# Patient Record
Sex: Male | Born: 1940
Health system: Southern US, Community
[De-identification: ages and names within clinical notes are randomized; demographics above are authoritative.]

## PROBLEM LIST (undated history)

## (undated) DIAGNOSIS — K219 Gastro-esophageal reflux disease without esophagitis: Secondary | ICD-10-CM

## (undated) DIAGNOSIS — I10 Essential (primary) hypertension: Secondary | ICD-10-CM

## (undated) DIAGNOSIS — E785 Hyperlipidemia, unspecified: Secondary | ICD-10-CM

## (undated) DIAGNOSIS — E119 Type 2 diabetes mellitus without complications: Secondary | ICD-10-CM

## (undated) DIAGNOSIS — U071 COVID-19: Secondary | ICD-10-CM

## (undated) HISTORY — PX: CHOLECYSTECTOMY: SHX55

## (undated) HISTORY — PX: APPENDECTOMY: SHX54

---

## 2000-06-19 ENCOUNTER — Ambulatory Visit (HOSPITAL_COMMUNITY)
Admission: RE | Admit: 2000-06-19 | Discharge: 2000-06-19 | Payer: Self-pay | Admitting: Physical Medicine and Rehabilitation

## 2000-06-19 ENCOUNTER — Encounter: Payer: Self-pay | Admitting: Physical Medicine and Rehabilitation

## 2013-08-09 ENCOUNTER — Emergency Department (HOSPITAL_COMMUNITY)
Admission: EM | Admit: 2013-08-09 | Discharge: 2013-08-09 | Disposition: A | Discharge status: Home / Self Care | Payer: Medicare HMO | Attending: Emergency Medicine | Admitting: Emergency Medicine

## 2013-08-09 ENCOUNTER — Encounter (HOSPITAL_COMMUNITY): Payer: Self-pay | Admitting: Emergency Medicine

## 2013-08-09 ENCOUNTER — Emergency Department (HOSPITAL_COMMUNITY): Payer: Medicare HMO

## 2013-08-09 DIAGNOSIS — E119 Type 2 diabetes mellitus without complications: Secondary | ICD-10-CM | POA: Insufficient documentation

## 2013-08-09 DIAGNOSIS — R5383 Other fatigue: Principal | ICD-10-CM

## 2013-08-09 DIAGNOSIS — R197 Diarrhea, unspecified: Secondary | ICD-10-CM

## 2013-08-09 DIAGNOSIS — R531 Weakness: Secondary | ICD-10-CM

## 2013-08-09 DIAGNOSIS — R5381 Other malaise: Secondary | ICD-10-CM | POA: Insufficient documentation

## 2013-08-09 DIAGNOSIS — I10 Essential (primary) hypertension: Secondary | ICD-10-CM | POA: Insufficient documentation

## 2013-08-09 HISTORY — DX: Type 2 diabetes mellitus without complications: E11.9

## 2013-08-09 HISTORY — DX: Essential (primary) hypertension: I10

## 2013-08-09 LAB — I-STAT TROPONIN, ED: Troponin i, poc: 0 ng/mL (ref 0.00–0.08)

## 2013-08-09 LAB — URINALYSIS, ROUTINE W REFLEX MICROSCOPIC
Bilirubin Urine: NEGATIVE
Glucose, UA: 250 mg/dL — AB
Hgb urine dipstick: NEGATIVE
Ketones, ur: NEGATIVE mg/dL
Leukocytes, UA: NEGATIVE
Nitrite: NEGATIVE
Protein, ur: NEGATIVE mg/dL
Specific Gravity, Urine: 1.014 (ref 1.005–1.030)
Urobilinogen, UA: 0.2 mg/dL (ref 0.0–1.0)
pH: 6 (ref 5.0–8.0)

## 2013-08-09 LAB — CBC WITH DIFFERENTIAL/PLATELET
Basophils Absolute: 0 K/uL (ref 0.0–0.1)
Basophils Relative: 0 % (ref 0–1)
Eosinophils Absolute: 0.1 K/uL (ref 0.0–0.7)
Eosinophils Relative: 1 % (ref 0–5)
HCT: 35.5 % — ABNORMAL LOW (ref 39.0–52.0)
Hemoglobin: 12.6 g/dL — ABNORMAL LOW (ref 13.0–17.0)
Lymphocytes Relative: 24 % (ref 12–46)
Lymphs Abs: 1.4 10*3/uL (ref 0.7–4.0)
MCH: 31.3 pg (ref 26.0–34.0)
MCHC: 35.5 g/dL (ref 30.0–36.0)
MCV: 88.3 fL (ref 78.0–100.0)
Monocytes Absolute: 0.6 K/uL (ref 0.1–1.0)
Monocytes Relative: 10 % (ref 3–12)
Neutro Abs: 3.9 10*3/uL (ref 1.7–7.7)
Neutrophils Relative %: 65 % (ref 43–77)
Platelets: 252 10*3/uL (ref 150–400)
RBC: 4.02 MIL/uL — ABNORMAL LOW (ref 4.22–5.81)
RDW: 12.2 % (ref 11.5–15.5)
WBC: 6 10*3/uL (ref 4.0–10.5)

## 2013-08-09 LAB — COMPREHENSIVE METABOLIC PANEL
ALT: 21 U/L (ref 0–53)
Albumin: 3.7 g/dL (ref 3.5–5.2)
Alkaline Phosphatase: 57 U/L (ref 39–117)
CO2: 24 mEq/L (ref 19–32)
Chloride: 93 mEq/L — ABNORMAL LOW (ref 96–112)
GFR calc Af Amer: 72 mL/min — ABNORMAL LOW (ref >90)
GFR calc non Af Amer: 62 mL/min — ABNORMAL LOW (ref >90)
Glucose, Bld: 176 mg/dL — ABNORMAL HIGH (ref 70–99)
Potassium: 4.2 mEq/L (ref 3.7–5.3)
Sodium: 131 mEq/L — ABNORMAL LOW (ref 137–147)

## 2013-08-09 LAB — COMPREHENSIVE METABOLIC PANEL WITH GFR
AST: 17 U/L (ref 0–37)
BUN: 14 mg/dL (ref 6–23)
Calcium: 9.1 mg/dL (ref 8.4–10.5)
Creatinine, Ser: 1.14 mg/dL (ref 0.50–1.35)
Total Bilirubin: 0.5 mg/dL (ref 0.3–1.2)
Total Protein: 7.1 g/dL (ref 6.0–8.3)

## 2013-08-09 LAB — CBG MONITORING, ED: Glucose-Capillary: 160 mg/dL — ABNORMAL HIGH (ref 70–99)

## 2013-08-09 MED ORDER — SODIUM CHLORIDE 0.9 % IV BOLUS (SEPSIS)
500.0000 mL | Freq: Once | INTRAVENOUS | Status: AC
  Administered 2013-08-09: 500 mL via INTRAVENOUS

## 2013-08-09 NOTE — ED Notes (Signed)
He states his blood sugar "has been going up and down" the past couple of days then yesterday he noticed his BP was high even though he has been taking his BP medication regularly. He woke this am "and felt like i may have slept wrong, like the L side of my face was numb." his daughter came to see him and noticed his L face was drooping so she brought him to the ED. He states hes "felt bad" since yesterday and has had a headache also

## 2013-08-09 NOTE — ED Notes (Signed)
Pt left discharge paperwork, wife sheila called and message left for pt. Papers placed at triage, charge RN notified.

## 2013-08-09 NOTE — ED Notes (Signed)
Ambulated to bathroom without difficulty

## 2013-08-09 NOTE — ED Provider Notes (Signed)
CSN: [id number]     Arrival date & time 08/09/13  0935 History   First MD Initiated Contact with Patient 08/09/13 0945     Chief Complaint  Patient presents with  . Facial Droop     (Consider location/radiation/quality/duration/timing/severity/associated sxs/prior Treatment) HPI Comments: Reports not feeling well for approximately one week. He admits he overexerted himself one week ago working in the yard and putting up fence up. He and family member note that his blood sugars have been somewhat volatile over the last couple of days, this morning woke up and it was much higher than usual at 260. He had originally been taken off of metformin a few years ago was put back on a proximally 6 months ago. Yesterday his blood pressures were also running high, at one point was approximately 180/111. He had called his primary care office, but never had a call back. This morning his blood pressure seems improved, but now his blood sugars were high. Family member thought the patient might have a slight drawing in his face. Patient denies any numbness or tingling, no numbness or weakness in his arms or legs and no gait difficulty. He notes a very slight gradual decrease in visual acuity but this is been more chronic. He reports he has a very mild generalized headache. He denies any neck pain, stiffness, fever, rash. He denies any chest pain, exertional chest tightness, shortness of breath. Otherwise he has been compliant with his medications. He notes that yesterday he had 6 episodes of watery diarrhea and has had diarrhea symptoms for a few days associated with some abdominal cramping.    The history is provided by the patient, a relative and medical records.    Past Medical History  Diagnosis Date  . Hypertension   . Diabetes mellitus without complication    Past Surgical History  Procedure Laterality Date  . Cholecystectomy     History reviewed. No pertinent family history. History  Substance Use  Topics  . Smoking status: Never Smoker   . Smokeless tobacco: Not on file  . Alcohol Use: Yes    Review of Systems  All other systems reviewed and are negative.     Allergies  Review of patient's allergies indicates no known allergies.  Home Medications   Prior to Admission medications   Not on File   BP 136/82  Pulse 81  Temp(Src) 97.9 F (36.6 C) (Oral)  Resp 16  Ht 6\' 2"  (1.88 m)  Wt 216 lb (97.977 kg)  BMI 27.72 kg/m2  SpO2 97% Physical Exam  Nursing note and vitals reviewed. Constitutional: He appears well-developed and well-nourished.  HENT:  Head: Normocephalic and atraumatic.  Eyes: Conjunctivae are normal. No scleral icterus.  Neck: Normal range of motion. Neck supple.  Cardiovascular: Normal rate, regular rhythm and intact distal pulses.   Pulmonary/Chest: Effort normal. No respiratory distress. He has no wheezes.  Abdominal: Soft. He exhibits no distension. There is no tenderness.  Neurological: He is alert.  Skin: Skin is warm.    ED Course  Procedures (including critical care time) Labs Review Labs Reviewed  CBC WITH DIFFERENTIAL - Abnormal; Notable for the following:    RBC 4.02 (*)    Hemoglobin 12.6 (*)    HCT 35.5 (*)    All other components within normal limits  COMPREHENSIVE METABOLIC PANEL - Abnormal; Notable for the following:    Sodium 131 (*)    Chloride 93 (*)    Glucose, Bld 176 (*)  GFR calc non Af Amer 62 (*)    GFR calc Af Amer 72 (*)    All other components within normal limits  URINALYSIS, ROUTINE W REFLEX MICROSCOPIC - Abnormal; Notable for the following:    Glucose, UA 250 (*)    All other components within normal limits  CBG MONITORING, ED - Abnormal; Notable for the following:    Glucose-Capillary 160 (*)    All other components within normal limits  I-STAT TROPOININ, ED    Imaging Review No results found.   EKG Interpretation   Date/Time:  Friday Aug 09 2013 09:56:02 EDT Ventricular Rate:  69 PR Interval:   161 QRS Duration: 101 QT Interval:  417 QTC Calculation: 447 R Axis:   -27 Text Interpretation:  Sinus rhythm Borderline left axis deviation Baseline  wander in lead(s) II III aVF Borderline ECG Confirmed by [facility]  MD, MICHEAL  ([id number]) on 08/09/2013 10:00:30 AM     RA sat is 97% and I interpret to be adequate  12:57 PM Pt feels improved, has ambulated with no difficulty.  RN also notes that pt may have a slight facial droop that only affects corner of left mouth and he has had subjective tingling of face.  Due to minor CVA symptoms and low NIH, I would not consider this to be a TPA candidate.  Will get an MRI however given enough question of the possibility of a slight facial droop seen by many different providers and family members   4:00 PM After IVF's, pt feels improved, ambulatory without difficulty.  Still minimal left facial droop.  PCP is in [place].  Will get non contrast brain MRI to assess for small CVA.  Pt signed out to Dr. Audie Pinto for follow up.    MDM   Final diagnoses:  Weakness  Diarrhea     Pt has some generalized symptoms.  No findings currently to suggest CVA.  Instead, history of generalized weakness, urinary freq and diarrhea likely has more to do with the way he is feeling poorly.     Saddie Benders. Elnathan Fulford, MD 08/09/13 1600

## 2013-08-09 NOTE — ED Notes (Signed)
Ambulated patient in hallway. Patient able to walk well with no assistance or visible mobility issues.

## 2013-08-09 NOTE — ED Notes (Signed)
Pt also reported to EDP Ghim that he had 6 episodes of watery diarrhea yesterday.  Pt's daughter reports to this RN that pt was taken off of Metformin d/t it causing renal failure and was recently (between 6 months and 1 year) was placed back on it.  EDP notified.

## 2013-08-09 NOTE — ED Provider Notes (Signed)
MRI results found no acute CNS changes.   MRI 08/09/2013 IMPRESSION: 1. No acute intracranial abnormality. 2. Mild for age nonspecific signal changes in the brain  With no acute findings do not find any evidence of acute stroke.  Stable for discharge.  Instructed to followup with his family physician.  Dot Lanes, MD 08/09/13 [phone]

## 2013-08-09 NOTE — ED Notes (Signed)
Pt axo x4, placed back on heart monitor.

## 2013-08-09 NOTE — Discharge Instructions (Signed)
Diet for Diarrhea, Adult  Frequent, runny stools (diarrhea) may be caused or worsened by food or drink. Diarrhea may be relieved by changing your diet. Since diarrhea can last up to 7 days, it is easy for you to lose too much fluid from the body and become dehydrated. Fluids that are lost need to be replaced. Along with a modified diet, make sure you drink enough fluids to keep your urine clear or pale yellow.  DIET INSTRUCTIONS  · Ensure adequate fluid intake (hydration): have 1 cup (8 oz) of fluid for each diarrhea episode. Avoid fluids that contain simple sugars or sports drinks, fruit juices, whole milk products, and sodas. Your urine should be clear or pale yellow if you are drinking enough fluids. Hydrate with an oral rehydration solution that you can purchase at pharmacies, retail stores, and online. You can prepare an oral rehydration solution at home by mixing the following ingredients together:  ·   tsp table salt.  · ¾ tsp baking soda.  ·  tsp salt substitute containing potassium chloride.  · 1  tablespoons sugar.  · 1 L (34 oz) of water.  · Certain foods and beverages may increase the speed at which food moves through the gastrointestinal (GI) tract. These foods and beverages should be avoided and include:  · Caffeinated and alcoholic beverages.  · High-fiber foods, such as raw fruits and vegetables, nuts, seeds, and whole grain breads and cereals.  · Foods and beverages sweetened with sugar alcohols, such as xylitol, sorbitol, and mannitol.  · Some foods may be well tolerated and may help thicken stool including:  · Starchy foods, such as rice, toast, pasta, low-sugar cereal, oatmeal, grits, baked potatoes, crackers, and bagels.    · Bananas.    · Applesauce.  · Add probiotic-rich foods to help increase healthy bacteria in the GI tract, such as yogurt and fermented milk products.  RECOMMENDED FOODS AND BEVERAGES  Starches  Choose foods with less than 2 g of fiber per serving.  · Recommended:  White,  French, and pita breads, plain rolls, buns, bagels. Plain muffins, matzo. Soda, saltine, or graham crackers. Pretzels, melba toast, zwieback. Cooked cereals made with water: cornmeal, farina, cream cereals. Dry cereals: refined corn, wheat, rice. Potatoes prepared any way without skins, refined macaroni, spaghetti, noodles, refined rice.  · Avoid:  Bread, rolls, or crackers made with whole wheat, multi-grains, rye, bran seeds, nuts, or coconut. Corn tortillas or taco shells. Cereals containing whole grains, multi-grains, bran, coconut, nuts, raisins. Cooked or dry oatmeal. Coarse wheat cereals, granola. Cereals advertised as "high-fiber." Potato skins. Whole grain pasta, wild or brown rice. Popcorn. Sweet potatoes, yams. Sweet rolls, doughnuts, waffles, pancakes, sweet breads.  Vegetables  · Recommended: Strained tomato and vegetable juices. Most well-cooked and canned vegetables without seeds. Fresh: Tender lettuce, cucumber without the skin, cabbage, spinach, bean sprouts.  · Avoid: Fresh, cooked, or canned: Artichokes, baked beans, beet greens, broccoli, Brussels sprouts, corn, kale, legumes, peas, sweet potatoes. Cooked: Green or red cabbage, spinach. Avoid large servings of any vegetables because vegetables shrink when cooked, and they contain more fiber per serving than fresh vegetables.  Fruit  · Recommended: Cooked or canned: Apricots, applesauce, cantaloupe, cherries, fruit cocktail, grapefruit, grapes, kiwi, mandarin oranges, peaches, pears, plums, watermelon. Fresh: Apples without skin, ripe banana, grapes, cantaloupe, cherries, grapefruit, peaches, oranges, plums. Keep servings limited to ½ cup or 1 piece.  · Avoid: Fresh: Apples with skin, apricots, mangoes, pears, raspberries, strawberries. Prune juice, stewed or dried prunes. Dried   fruits, raisins, dates. Large servings of all fresh fruits.  Protein  · Recommended: Ground or well-cooked tender beef, ham, veal, lamb, pork, or poultry. Eggs. Fish,  oysters, shrimp, lobster, other seafoods. Liver, organ meats.  · Avoid: Tough, fibrous meats with gristle. Peanut butter, smooth or chunky. Cheese, nuts, seeds, legumes, dried peas, beans, lentils.  Dairy  · Recommended: Yogurt, lactose-free milk, kefir, drinkable yogurt, buttermilk, soy milk, or plain hard cheese.  · Avoid: Milk, chocolate milk, beverages made with milk, such as milkshakes.  Soups  · Recommended: Bouillon, broth, or soups made from allowed foods. Any strained soup.  · Avoid: Soups made from vegetables that are not allowed, cream or milk-based soups.  Desserts and Sweets  · Recommended: Sugar-free gelatin, sugar-free frozen ice pops made without sugar alcohol.  · Avoid: Plain cakes and cookies, pie made with fruit, pudding, custard, cream pie. Gelatin, fruit, ice, sherbet, frozen ice pops. Ice cream, ice milk without nuts. Plain hard candy, honey, jelly, molasses, syrup, sugar, chocolate syrup, gumdrops, marshmallows.  Fats and Oils  · Recommended: Limit fats to less than 8 tsp per day.  · Avoid: Seeds, nuts, olives, avocados. Margarine, butter, cream, mayonnaise, salad oils, plain salad dressings. Plain gravy, crisp bacon without rind.  Beverages  · Recommended: Water, decaffeinated teas, oral rehydration solutions, sugar-free beverages not sweetened with sugar alcohols.  · Avoid: Fruit juices, caffeinated beverages (coffee, tea, soda), alcohol, sports drinks, or lemon-lime soda.  Condiments  · Recommended: Ketchup, mustard, horseradish, vinegar, cocoa powder. Spices in moderation: allspice, basil, bay leaves, celery powder or leaves, cinnamon, cumin powder, curry powder, ginger, mace, marjoram, onion or garlic powder, oregano, paprika, parsley flakes, ground pepper, rosemary, sage, savory, tarragon, thyme, turmeric.  · Avoid: Coconut, honey.  Document Released: 06/18/2003 Document Revised: 12/21/2011 Document Reviewed: 08/12/2011  [employer]® Patient Information ©2014 [employer].

## 2017-03-05 ENCOUNTER — Encounter (HOSPITAL_COMMUNITY): Payer: Self-pay | Admitting: Emergency Medicine

## 2017-03-05 ENCOUNTER — Emergency Department (HOSPITAL_COMMUNITY): Payer: Medicare HMO

## 2017-03-05 ENCOUNTER — Observation Stay (HOSPITAL_COMMUNITY)
Admission: EM | Admit: 2017-03-05 | Discharge: 2017-03-06 | Disposition: A | Discharge status: Home / Self Care | Payer: Medicare HMO | Attending: Student in an Organized Health Care Education/Training Program | Admitting: Student in an Organized Health Care Education/Training Program

## 2017-03-05 ENCOUNTER — Other Ambulatory Visit: Payer: Self-pay

## 2017-03-05 DIAGNOSIS — Z79899 Other long term (current) drug therapy: Secondary | ICD-10-CM | POA: Diagnosis not present

## 2017-03-05 DIAGNOSIS — I10 Essential (primary) hypertension: Secondary | ICD-10-CM | POA: Diagnosis not present

## 2017-03-05 DIAGNOSIS — E119 Type 2 diabetes mellitus without complications: Secondary | ICD-10-CM | POA: Diagnosis not present

## 2017-03-05 DIAGNOSIS — Z7984 Long term (current) use of oral hypoglycemic drugs: Secondary | ICD-10-CM | POA: Insufficient documentation

## 2017-03-05 DIAGNOSIS — R079 Chest pain, unspecified: Secondary | ICD-10-CM

## 2017-03-05 HISTORY — DX: Gastro-esophageal reflux disease without esophagitis: K21.9

## 2017-03-05 HISTORY — DX: Chest pain, unspecified: R07.9

## 2017-03-05 HISTORY — DX: Hyperlipidemia, unspecified: E78.5

## 2017-03-05 LAB — BASIC METABOLIC PANEL
Anion gap: 9 (ref 5–15)
BUN: 13 mg/dL (ref 6–20)
CO2: 24 mmol/L (ref 22–32)
Calcium: 9 mg/dL (ref 8.9–10.3)
Chloride: 100 mmol/L — ABNORMAL LOW (ref 101–111)
Creatinine, Ser: 1.17 mg/dL (ref 0.61–1.24)
GFR calc Af Amer: >60 mL/min (ref >60)
GFR calc non Af Amer: 59 mL/min — ABNORMAL LOW (ref >60)
Glucose, Bld: 184 mg/dL — ABNORMAL HIGH (ref 65–99)
POTASSIUM: 3.4 mmol/L — AB (ref 3.5–5.1)
Sodium: 133 mmol/L — ABNORMAL LOW (ref 135–145)

## 2017-03-05 LAB — CBC
HEMATOCRIT: 35.3 % — AB (ref 39.0–52.0)
HEMOGLOBIN: 12.2 g/dL — AB (ref 13.0–17.0)
MCH: 31.4 pg (ref 26.0–34.0)
MCHC: 34.6 g/dL (ref 30.0–36.0)
MCV: 91 fL (ref 78.0–100.0)
Platelets: 244 10*3/uL (ref 150–400)
RBC: 3.88 MIL/uL — ABNORMAL LOW (ref 4.22–5.81)
RDW: 12.8 % (ref 11.5–15.5)
WBC: 6.4 10*3/uL (ref 4.0–10.5)

## 2017-03-05 LAB — TROPONIN I
Troponin I: <0.03 ng/mL (ref <0.03)
Troponin I: <0.03 ng/mL (ref <0.03)

## 2017-03-05 LAB — GLUCOSE, CAPILLARY
GLUCOSE-CAPILLARY: 120 mg/dL — AB (ref 65–99)
GLUCOSE-CAPILLARY: 221 mg/dL — AB (ref 65–99)

## 2017-03-05 LAB — I-STAT TROPONIN, ED: Troponin i, poc: 0.01 ng/mL (ref 0.00–0.08)

## 2017-03-05 MED ORDER — POTASSIUM CHLORIDE CRYS ER 20 MEQ PO TBCR
40.0000 meq | EXTENDED_RELEASE_TABLET | Freq: Once | ORAL | Status: AC
  Administered 2017-03-05: 40 meq via ORAL
  Filled 2017-03-05: qty 2

## 2017-03-05 MED ORDER — FAMOTIDINE 20 MG PO TABS: 20.0000 mg | ORAL_TABLET | Freq: Two times a day (BID) | ORAL | Status: DC | PRN

## 2017-03-05 MED ORDER — ACETAMINOPHEN 325 MG PO TABS: 650.0000 mg | ORAL_TABLET | ORAL | Status: DC | PRN

## 2017-03-05 MED ORDER — ENOXAPARIN SODIUM 40 MG/0.4ML ~~LOC~~ SOLN
40.0000 mg | SUBCUTANEOUS | Status: DC
  Filled 2017-03-05: qty 0.4

## 2017-03-05 MED ORDER — ONDANSETRON HCL 4 MG/2ML IJ SOLN: 4.0000 mg | Freq: Four times a day (QID) | INTRAMUSCULAR | Status: DC | PRN

## 2017-03-05 MED ORDER — NITROGLYCERIN 0.4 MG SL SUBL: 0.4000 mg | SUBLINGUAL_TABLET | SUBLINGUAL | Status: DC | PRN

## 2017-03-05 MED ORDER — INSULIN ASPART 100 UNIT/ML ~~LOC~~ SOLN: 0.0000 [IU] | Freq: Three times a day (TID) | SUBCUTANEOUS | Status: DC

## 2017-03-05 MED ORDER — ASPIRIN 81 MG PO CHEW
81.0000 mg | CHEWABLE_TABLET | Freq: Every day | ORAL | Status: DC
  Filled 2017-03-05: qty 1

## 2017-03-05 NOTE — ED Provider Notes (Signed)
[facility] EMERGENCY DEPARTMENT Provider Note   CSN: [id number] Arrival date & time: 03/05/17  1338     History   Chief Complaint Chief Complaint  Patient presents with  . Chest Pain     HPI   Blood pressure 130/90, pulse 72, temperature 98.6 F (37 C), temperature source Oral, resp. rate 16, SpO2 96 %.  Luke Edwards is a 76 y.o. male past medical history significant for non-insulin-dependent diabetes, hypertension, hyperlipidemia (refused medication) complaining of retrosternal nonradiating chest discomfort which he describes as "like acid reflux."  Onset at 2 AM, it did not wake him from sleep.  There is no exacerbating or alleviating factors identified.  No associated symptoms including diaphoresis, shortness of breath, nausea.  He is never had a pain like this before.  Specifically denies exacerbation with exertion.  He took a spoonful of mustard in addition to antacids with little relief.  911 was called and they gave full dose aspirin and sublingual nitroglycerin with complete resolution of pain.  He has a family history of ACS, has never been evaluated by cardiology, no provocative testing.  He denies fever, chills, cough, increasing peripheral edema.  Past Medical History:  Diagnosis Date  . Diabetes mellitus without complication ([place])   . Hypertension     There are no active problems to display for this patient.   Past Surgical History:  Procedure Laterality Date  . CHOLECYSTECTOMY         Home Medications    Prior to Admission medications   Medication Sig Start Date End Date Taking? Authorizing Provider  glipiZIDE (GLUCOTROL XL) 5 MG 24 hr tablet Take 5 mg by mouth daily with breakfast.    [provider]  lisinopril-hydrochlorothiazide (PRINZIDE,ZESTORETIC) 20-25 MG per tablet Take 1 tablet by mouth daily.    [provider]  metFORMIN (GLUCOPHAGE) 500 MG tablet Take 500 mg by mouth 2 (two) times daily with a meal.     [provider]  ranitidine (ZANTAC) 150 MG tablet Take 150 mg by mouth daily.    [provider]    Family History No family history on file.  Social History Social History   Tobacco Use  . Smoking status: Never Smoker  Substance Use Topics  . Alcohol use: Yes  . Drug use: No     Allergies   Patient has no known allergies.   Review of Systems Review of Systems  A complete review of systems was obtained and all systems are negative except as noted in the HPI and PMH.   Physical Exam Updated Vital Signs BP 122/74   Pulse 67   Temp 98.6 F (37 C) (Oral)   Resp 18   SpO2 100%   Physical Exam  Constitutional: He is oriented to person, place, and time. He appears well-developed and well-nourished. No distress.  HENT:  Head: Normocephalic.  Mouth/Throat: Oropharynx is clear and moist.  Eyes: Conjunctivae are normal.  Neck: Normal range of motion. No JVD present. No tracheal deviation present.  Cardiovascular: Normal rate, regular rhythm and intact distal pulses.  Radial pulse equal bilaterally  Pulmonary/Chest: Effort normal and breath sounds normal. No stridor. No respiratory distress. He has no wheezes. He has no rales. He exhibits no tenderness.  Abdominal: Soft. He exhibits no distension and no mass. There is no tenderness. There is no rebound and no guarding.  Musculoskeletal: Normal range of motion. He exhibits no edema or tenderness.  No calf asymmetry, superficial collaterals, palpable cords, edema,  Homans sign negative bilaterally.    Neurological: He is alert and oriented to person, place, and time. He displays normal reflexes.  Skin: Skin is warm. He is not diaphoretic.  Psychiatric: He has a normal mood and affect.  Nursing note and vitals reviewed.    ED Treatments / Results  Labs (all labs ordered are listed, but only abnormal results are displayed) Labs Reviewed  BASIC METABOLIC PANEL - Abnormal; Notable for the following  components:      Result Value   Sodium 133 (*)    Potassium 3.4 (*)    Chloride 100 (*)    Glucose, Bld 184 (*)    GFR calc non Af Amer 59 (*)    All other components within normal limits  CBC - Abnormal; Notable for the following components:   RBC 3.88 (*)    Hemoglobin 12.2 (*)    HCT 35.3 (*)    All other components within normal limits  I-STAT TROPONIN, ED    EKG  EKG Interpretation  Date/Time:  Sunday March 05 2017 13:43:31 EST Ventricular Rate:  93 PR Interval:    QRS Duration: 98 QT Interval:  365 QTC Calculation: 454 R Axis:   -41 Text Interpretation:  Sinus rhythm Left axis deviation Abnormal R-wave progression, late transition Borderline repolarization abnormality No acute changes Confirmed by Brantley Stage [phone]) on 03/05/2017 2:05:16 PM       Radiology Dg Chest 2 View  Result Date: 03/05/2017 CLINICAL DATA:  76 year old male with history of epigastric and substernal chest pain. EXAM: CHEST  2 VIEW COMPARISON:  Chest x-ray 03/05/2017. FINDINGS: Lung volumes are normal. No consolidative airspace disease. No pleural effusions. No pneumothorax. No pulmonary nodule or mass noted. Pulmonary vasculature and the cardiomediastinal silhouette are within normal limits. IMPRESSION: No radiographic evidence of acute cardiopulmonary disease. Electronically Signed   By: Vinnie Langton M.D.   On: 03/05/2017 14:26    Procedures Procedures (including critical care time)  Medications Ordered in ED Medications - No data to display   Initial Impression / Assessment and Plan / ED Course  I have reviewed the triage vital signs and the nursing notes.  Pertinent labs & imaging results that were available during my care of the patient were reviewed by me and considered in my medical decision making (see chart for details).     Vitals:   03/05/17 1346 03/05/17 1400 03/05/17 1430 03/05/17 1445  BP: (!) 151/95 130/90 130/80 122/74  Pulse: 83 72 70 67  Resp: 18 16 13 18     Temp: 98.6 F (37 C)     TempSrc: Oral     SpO2: 95% 96% 98% 100%    Luke Edwards is 76 y.o. male presenting with chest pain which he described as like acid reflux occurring at 2 AM with him constantly throughout the day with no exacerbating or alleviating factors alleviated with nitroglycerin.  Patient moderate risk by heart score, EKG nonischemic, troponin negative, chest x-ray negative, given his age and cardiac risk factors will need chest pain rule out.  Unassigned admission to internal medicine teaching service.       Final Clinical Impressions(s) / ED Diagnoses   Final diagnoses:  Chest pain, unspecified type    ED Discharge Orders    None       Waynetta Pean 03/05/17 1515    Forde Dandy, MD 03/05/17 [phone]

## 2017-03-05 NOTE — H&P (Signed)
Date: 03/05/2017               Patient Name:  Luke Edwards MRN: [id number]  DOB: 1940-04-20 Age / Sex: 76 y.o., male   PCP: Raelyn Number, MD         Medical Service: Internal Medicine Teaching Service         Attending Physician: Dr. Evette Doffing, Mallie Mussel, *    First Contact: Dr. Isac Sarna Pager: [phone]  Second Contact: Dr. Philipp Ovens  Pager: [phone]       After Hours (After 5p/  First Contact Pager: [phone]  weekends / holidays): Second Contact Pager: [phone]   Chief Complaint: Chest pain   History of Present Illness:  Luke Edwards is a 76 yo M with a pmhx of HTN, HLD, DM, and GERD who presented to the ED with chest pain. Patient reports he woke up around 2:30 am and went to use the bathroom when he developed substernal chest pain (3/10) that he felt was consistent with indigestion. Denies radiation of pain and association with shortness of breath, N/V, and diaphoresis. He took antiacids, 3 baby aspirins, and mustard without relief. He tried drinking a glass of milk but pain persisted. Denies worsening of pain with exertion and unable to identify alleviating factors. States the pain feels similar to prior indigestion pain. His daughter convinced him to call EMS. He was given 1 SL nitro with complete resolution of pain. On ROS he denied fevers or recent illness. He has never seen a cardiologist. He was seen by his PCP a few days ago and was told he was doing fine. No records available to review.   ED course: Patient was afebrile and hemodynamically stable with T 98.47F, BP 130/90, HR 83, RR 18, and oxygen 100% on RA. I-stat troponin was 0.00. Initial EKG was non ischemic, sinus rhythm, with no signs of acute ischemia. CXR was negative for acute disease. On admission evaluation he was chest pain free.   Review of Systems: A complete ROS was negative except as per HPI.   Meds:  Metformin 500 mg BID  Glipizide 5 mg QD  Lisinopril-HCTZ 20-25 mg QD Ranitidine 150 mg QD     Allergies: Allergies as of 03/05/2017  . (No Known Allergies)   Past Medical History:  Diagnosis Date  . Diabetes mellitus without complication ([place])   . Hypertension     Family History: Mom with valvular disease, died in her 73s. Dad deceased from MI in early 60s.    Social History: Never smoker. Drinks 2 beers every Wednesday with his racing club. No drug use. Works part-time as a Dealer.   Physical Exam: Blood pressure (!) 134/91, pulse 66, temperature 98.6 F (37 C), temperature source Oral, resp. rate 12, SpO2 99 %.  General: very pleasant male, well-nourished, well-developed, lying in bed in no acute distress  HENT: normocephalic and atraumaric, neck supple and FROM, MMM  Cardiac: regular rate and rhythm, nl S1/S2, no murmurs, rubs or gallops  Pulm: CTAB, no wheezes or crackles, no increased work of breathing  Abd: soft, NTND, bowel sounds present  Neuro: A&Ox3, able to move all 4 extremities with no focal deficits noted  Ext: warm and well perfused, no peripheral edema noted Derm: no rashes or lesions noted    EKG: personally reviewed my interpretation is S rwith HR 93 bpm, left axis deviation, nl intervals, no signs of acute ischemia   CXR: personally reviewed my interpretation is no airway deviation, no cardiomegaly,  no opacities/infiltrates or consolidations noted, no pleural effusions   Assessment & Plan by Problem:  Luke Edwards is a 76 yo M with a history of HTN, HLD, T2DM, and GERD who presented to the ED with chest pain concerning for ACS given risk factors.   # Chest pain: concern for ACS given risk factors and relief with SL nitro. Thus far cardiac workup negative including I-stat troponin and EKG. He does have a history of GERD and states pain is similar to prior indigestion episodes. CXR negative for acute processes therefore unlikely pulmonary etiology.  - Trend troponin  - Repeat EKG in AM  - Telemetry  - Consider cardiology consult in AM for  ischemic evaluation. If cardiac workup negative in AM, can probably go home with outpatient cardiology follow up.   # Hypokalemia: K 3.4 on admission  - Repleting  - BMP in AM   # T2DM: No A1c to review. Takes Metformin 1000 BID and Glucaphage 5 daily at home.  - Holding home meds  - SSI-M  - CBG monitoring   # HTN:  - Holding home lisinopril-HCTZ in the setting of soft BP, likely due to SL nitro. Can resume in AM.   F: none  E: continue to monitor  N: HH + CM diet   VTE ppx: SQ Lovenox  Code status: Full code, confirmed on admission.   Dispo: Admit patient to Observation with expected length of stay less than 2 midnights.  SignedWelford Roche, MD 03/05/2017, 3:54 PM  Pager: [phone]

## 2017-03-05 NOTE — ED Triage Notes (Signed)
Per EMS- epigastric/substernal chest pain, felt like indigestion. Ate and had no relief. Took 324 Asprin. 1 nitro given. Pain 1/10. 20G PIv to rt wrist.

## 2017-03-05 NOTE — ED Notes (Signed)
Patient transported to X-ray 

## 2017-03-06 ENCOUNTER — Other Ambulatory Visit: Payer: Self-pay

## 2017-03-06 ENCOUNTER — Encounter (HOSPITAL_COMMUNITY): Payer: Self-pay | Admitting: *Deleted

## 2017-03-06 ENCOUNTER — Other Ambulatory Visit: Payer: Self-pay | Admitting: Cardiology

## 2017-03-06 DIAGNOSIS — E119 Type 2 diabetes mellitus without complications: Secondary | ICD-10-CM | POA: Diagnosis not present

## 2017-03-06 DIAGNOSIS — E785 Hyperlipidemia, unspecified: Secondary | ICD-10-CM

## 2017-03-06 DIAGNOSIS — R079 Chest pain, unspecified: Secondary | ICD-10-CM

## 2017-03-06 DIAGNOSIS — I10 Essential (primary) hypertension: Secondary | ICD-10-CM | POA: Diagnosis not present

## 2017-03-06 DIAGNOSIS — Z8249 Family history of ischemic heart disease and other diseases of the circulatory system: Secondary | ICD-10-CM

## 2017-03-06 DIAGNOSIS — K219 Gastro-esophageal reflux disease without esophagitis: Secondary | ICD-10-CM | POA: Diagnosis not present

## 2017-03-06 DIAGNOSIS — R072 Precordial pain: Secondary | ICD-10-CM | POA: Diagnosis not present

## 2017-03-06 DIAGNOSIS — Z7984 Long term (current) use of oral hypoglycemic drugs: Secondary | ICD-10-CM | POA: Diagnosis not present

## 2017-03-06 LAB — BASIC METABOLIC PANEL
ANION GAP: 7 (ref 5–15)
BUN: 12 mg/dL (ref 6–20)
CALCIUM: 9 mg/dL (ref 8.9–10.3)
CO2: 27 mmol/L (ref 22–32)
Chloride: 102 mmol/L (ref 101–111)
Creatinine, Ser: 1.22 mg/dL (ref 0.61–1.24)
GFR calc Af Amer: >60 mL/min (ref >60)
GFR, EST NON AFRICAN AMERICAN: 56 mL/min — AB (ref >60)
Glucose, Bld: 138 mg/dL — ABNORMAL HIGH (ref 65–99)
POTASSIUM: 3.9 mmol/L (ref 3.5–5.1)
SODIUM: 136 mmol/L (ref 135–145)

## 2017-03-06 LAB — TROPONIN I: Troponin I: <0.03 ng/mL (ref <0.03)

## 2017-03-06 LAB — GLUCOSE, CAPILLARY
Glucose-Capillary: 184 mg/dL — ABNORMAL HIGH (ref 65–99)
Glucose-Capillary: 218 mg/dL — ABNORMAL HIGH (ref 65–99)

## 2017-03-06 MED ORDER — ASPIRIN 81 MG PO CHEW: 81.0000 mg | CHEWABLE_TABLET | Freq: Every day | ORAL | 2 refills | Status: DC

## 2017-03-06 NOTE — Consult Note (Signed)
Cardiology Consultation:   Patient ID: Luke Edwards; [id number]; [birth year]   Admit date: 03/05/2017 Date of Consult: 03/06/2017  Primary Care Provider: Raelyn Number, MD Primary Cardiologist: NA Primary Electrophysiologist:  NA   Patient Profile:   Luke Edwards is a 76 y.o. male with no known history of CAD, but a hx of HTN, HLD, DM Type II, and GERD who is being seen today for the evaluation of new onset CP at the request of Dr. Isac Sarna.   Reports a family hx of MI in his father in his 70's. His mother had a history of valve replacement at unknown age.  History of Present Illness:   [name] was in his normal state of health prior to admission. Early Sunday morning he awoke at his usual time and noticed he was having indigestion like symptoms. He describes it as a feeling of something "stuck" in his mid-sternal chest. He has had these symptoms before. This was not relieved by 2x baby ASA, 2x "acid relievers", or one tbs of mustard, which would normally do so. He rated the pain as a 3/10 in severity which did not get worse with activity.The pain did not radiate and was not associated with SOB, N/V, diaphoresis, or numbness/tingling in the arms or hands. He denies the use of daily NSAID. Prefers Tylenol when needed.   He is pain free at the moment. He reports that he has been eating spicy foods, ice cream, and cake over the weekend, all of which are not a part of his normal diet. He is compliant with all medications and takes a daily Ranitidine for his GERD symptoms.   He reports that his BP upon checking at his home was as high as 200/100. Normal BP's for him range from 150's SBP-70-80's DBP. The pt notified his daughter who encouraged him to call EMS.  In the EMS, the pt was given 1 SL NTG, which gave him complete relief. He has not had any chest pain or indigestion like symptoms since arrival.     Past Medical History:  Diagnosis Date  . Diabetes mellitus without  complication ([place])   . GERD (gastroesophageal reflux disease)   . HLD (hyperlipidemia)   . Hypertension     Past Surgical History:  Procedure Laterality Date  . CHOLECYSTECTOMY       Prior to Admission medications   Medication Sig Start Date End Date Taking? Authorizing Provider  glipiZIDE (GLUCOTROL XL) 5 MG 24 hr tablet Take 5 mg by mouth daily with breakfast.    [provider]  lisinopril-hydrochlorothiazide (PRINZIDE,ZESTORETIC) 20-25 MG per tablet Take 1 tablet by mouth daily.    [provider]  metFORMIN (GLUCOPHAGE) 500 MG tablet Take 500 mg by mouth 2 (two) times daily with a meal.    [provider]  ranitidine (ZANTAC) 150 MG tablet Take 150 mg by mouth daily.    [provider]    Inpatient Medications: Scheduled Meds: . aspirin  81 mg Oral Daily  . enoxaparin (LOVENOX) injection  40 mg Subcutaneous Q24H  . insulin aspart  0-15 Units Subcutaneous TID WC   Continuous Infusions:  PRN Meds: acetaminophen, famotidine, nitroGLYCERIN, ondansetron (ZOFRAN) IV  Allergies:   No Known Allergies  Social History:   Social History   Socioeconomic History  . Marital status: Married    Spouse name: Not on file  . Number of children: Not on file  . Years of education: Not on file  . Highest education level: Not on  file  Social Needs  . Financial resource strain: Not on file  . Food insecurity - worry: Not on file  . Food insecurity - inability: Not on file  . Transportation needs - medical: Not on file  . Transportation needs - non-medical: Not on file  Occupational History  . Not on file  Tobacco Use  . Smoking status: Never Smoker  Substance and Sexual Activity  . Alcohol use: Yes    Alcohol/week: 1.2 oz    Types: 2 Cans of beer per week    Comment: Once during the week   . Drug use: No  . Sexual activity: Not on file  Other Topics Concern  . Not on file  Social History Narrative  . Not on file    Family History:     Family History  Problem Relation Age of Onset  . Valvular heart disease Mother   . Heart attack Father        93's   Family Status:  Family Status  Relation Name Status  . Mother  Deceased  . Father  Deceased    ROS:  Please see the history of present illness.  All other ROS reviewed and negative.     Physical Exam/Data:   Vitals:   03/05/17 1530 03/05/17 1612 03/05/17 2041 03/06/17 0652  BP: (!) 134/91 (!) 143/67 (!) 156/81 (!) 154/85  Pulse: 66  64 73  Resp: 12 16 18 18   Temp:  98.2 F (36.8 C) 98.6 F (37 C) 98 F (36.7 C)  TempSrc:  Oral Oral Oral  SpO2: 99% 98%  99%  Weight:  205 lb 12.8 oz (93.4 kg)    Height:  6\' 2"  (1.88 m)      Intake/Output Summary (Last 24 hours) at 03/06/2017 0910 Last data filed at 03/06/2017 0700 Gross per 24 hour  Intake 240 ml  Output -  Net 240 ml   Filed Weights   03/05/17 1612  Weight: 205 lb 12.8 oz (93.4 kg)   Body mass index is 26.42 kg/m.   General:  Well nourished, well developed, in no acute distress HEENT: Normal  Neck: No JVD Vascular: No carotid bruits; 4/4 extremity pulses 2+, without bruits  Cardiac:  Normal S1, S2; RRR; no murmur, rubs or thrills Lungs: Clear to auscultation bilaterally, no wheezing, rhonchi or rales  Abd: Soft, nontender Ext: No edema Musculoskeletal:  No deformities, BUE and BLE strength normal and equal Skin: Warm and dry  Neuro: CNs 2-12 intact, no focal abnormalities noted Psych: Normal affect   EKG:  The EKG was personally reviewed and demonstrates:  Non-acute, NSR 69. Poor R wave progression. Telemetry:  Telemetry was personally reviewed and demonstrates:  NSR   Relevant CV Studies:  ECHO: NA  CATH: NA  Laboratory Data:  Chemistry Recent Labs  Lab 03/05/17 1340 03/06/17 0332  NA 133* 136  K 3.4* 3.9  CL 100* 102  CO2 24 27  GLUCOSE 184* 138*  BUN 13 12  CREATININE 1.17 1.22  CALCIUM 9.0 9.0  GFRNONAA 59* 56*  GFRAA >60 >60  ANIONGAP 9 7    Total Protein   Date Value Ref Range Status  08/09/2013 7.1 6.0 - 8.3 g/dL Final   Albumin  Date Value Ref Range Status  08/09/2013 3.7 3.5 - 5.2 g/dL Final   AST  Date Value Ref Range Status  08/09/2013 17 0 - 37 U/L Final   ALT  Date Value Ref Range Status  08/09/2013 21 0 -  53 U/L Final   Alkaline Phosphatase  Date Value Ref Range Status  08/09/2013 57 39 - 117 U/L Final   Total Bilirubin  Date Value Ref Range Status  08/09/2013 0.5 0.3 - 1.2 mg/dL Final   Hematology Recent Labs  Lab 03/05/17 1340  WBC 6.4  RBC 3.88*  HGB 12.2*  HCT 35.3*  MCV 91.0  MCH 31.4  MCHC 34.6  RDW 12.8  PLT 244   Cardiac Enzymes Recent Labs  Lab 03/05/17 1651 03/05/17 2129 03/06/17 0332  TROPONINI <0.03 <0.03 <0.03    Recent Labs  Lab 03/05/17 1358  TROPIPOC 0.01    BNPNo results for input(s): BNP, PROBNP in the last 168 hours.  DDimer No results for input(s): DDIMER in the last 168 hours. TSH: No results found for: TSH Lipids:No results found for: CHOL, HDL, LDLCALC, LDLDIRECT, TRIG, CHOLHDL HgbA1c:No results found for: HGBA1C  Radiology/Studies:  Dg Chest 2 View  Result Date: 03/05/2017 CLINICAL DATA:  76 year old male with history of epigastric and substernal chest pain. EXAM: CHEST  2 VIEW COMPARISON:  Chest x-ray 03/05/2017. FINDINGS: Lung volumes are normal. No consolidative airspace disease. No pleural effusions. No pneumothorax. No pulmonary nodule or mass noted. Pulmonary vasculature and the cardiomediastinal silhouette are within normal limits. IMPRESSION: No radiographic evidence of acute cardiopulmonary disease. Electronically Signed   By: Vinnie Langton M.D.   On: 03/05/2017 14:26    Assessment and Plan:   1. Chest Pain- -Negative CE's, non-acute EKG. Pt remains pain free. -Will plan for outpatient echocardiogram and treadmill stress test. Will make separate note with appointment details -Will continue EC ASA until stress test complete  2. GERD- -Continue daily  Zantac -Refrain from spicy or known irritating foods  3. HTN- -Consider titrating Lisinopril to 40mg  daily, but this would require splitting the prescription. -Will follow BP as outpatient  4. DM -Continue current DM regimen of metformin and glipizide  Active Problems:   Chest pain  For questions or updates, please contact [facility] Please consult [email or web address] for contact info under Cardiology/STEMI.   Signed, Kathyrn Drown, NP  03/06/2017 9:10 AM  I have seen and examined the patient along with Kathyrn Drown, NP.  I have reviewed the chart, notes and new data.  I agree with her note.  Key new complaints: no recurrence of chest pain. No preceding exertional symptoms. Prominent and longstanding GI complaints. Key examination changes: normal CV exam Key new findings / data: normal ECG and troponin x 3  PLAN: Low risk for major event in the next few weeks. High risk for CAD due to age, gender, HBP, DM. Recommend outpatient workup with stress ECG test and echo. While symptom resolution with NTG raises the concern for coronary etiology, esophageal spasm amy respond in a similar fashion. BP high - increase ACEi. Lipids and HgbA1c have been checked by his PCP, results not available, but "OK" per patient.  Sanda Klein, MD, [place] [phone] 03/06/2017, 10:18 AM

## 2017-03-06 NOTE — Progress Notes (Addendum)
Stress test and echo planned for 12/11 and follow up appointment scheduled for 12/18.

## 2017-03-06 NOTE — Progress Notes (Signed)
   Subjective:  No acute events overnight.  Patient feeling well this morning.  Denied chest pain.  Would like to go home.  Objective:  Vital signs in last 24 hours: Vitals:   03/05/17 1530 03/05/17 1612 03/05/17 2041 03/06/17 0652  BP: (!) 134/91 (!) 143/67 (!) 156/81 (!) 154/85  Pulse: 66  64 73  Resp: 12 16 18 18   Temp:  98.2 F (36.8 C) 98.6 F (37 C) 98 F (36.7 C)  TempSrc:  Oral Oral Oral  SpO2: 99% 98%  99%  Weight:  205 lb 12.8 oz (93.4 kg)    Height:  6\' 2"  (1.88 m)     General: Very pleasant male, well-nourished, well-developed, sitting up in bed in no acute distress Cardiac: regular rate and rhythm, nl S1/S2, no murmurs, rubs or gallops  Pulm: CTAB, no wheezes or crackles, no increased work of breathing  Abd: soft, NTND, bowel sounds present Neuro: A&Ox3, able to move all 4 extremities with no focal deficits noted Ext: warm and well perfused, no peripheral edema   Assessment/Plan:  Luke Edwards is a 76 yo M with a history of HTN, HLD, T2DM, and GERD who presented to the ED with chest pain concerning for ACS given risk factors.   # Chest pain: Cardiac workup thus far negative including EKG negative for acute ischemia and troponin negative x3.   He is chest pain-free this morning and would like to go home.  Cardiology recommended outpatient stress test and echocardiogram given risk factors for coronary artery disease.  Will discharge home today with close PCP follow-up and will recommend outpatient referral to cardiology.  Will continue daily baby aspirin and recommend discussion with PCP regarding statin therapy.  - Continue ASA 81 mg QD  - Anticipate discharge home today  # Hypokalemia:  Resolved  # T2DM: No A1c to review. Takes Metformin 1000 BID and Glucaphage 5 daily at home.  - Holding home meds  - SSI-M  - CBG monitoring  -We will resume home meds on discharge  # HTN:  - Holding home lisinopril-HCTZ in the setting of soft BP, likely due to SL  nitro. Can resume in AM.   F: none  E: continue to monitor  N: HH + CM diet   VTE ppx: SQ Lovenox  Code status: Full code, confirmed on admission.    Dispo: Anticipated discharge in approximately today.   Welford Roche, MD 03/06/2017, 11:21 AM Pager: [phone]

## 2017-03-06 NOTE — Discharge Summary (Signed)
Name: Luke Edwards MRN: [id number] DOB: 1940-05-15 75 y.o. PCP: Raelyn Number, MD  Date of Admission: 03/05/2017  1:38 PM Date of Discharge: 03/06/2017 Attending Physician: Axel Filler, *  Discharge Diagnosis: 1.  Atypical chest pain Active Problems:   Chest pain   Discharge Medications: Allergies as of 03/06/2017   No Known Allergies     Medication List    TAKE these medications   aspirin 81 MG chewable tablet Chew 1 tablet (81 mg total) by mouth daily. Start taking on:  03/07/2017   glipiZIDE 5 MG 24 hr tablet Commonly known as:  GLUCOTROL XL Take 5 mg by mouth daily with breakfast.   lisinopril-hydrochlorothiazide 20-25 MG tablet Commonly known as:  PRINZIDE,ZESTORETIC Take 1 tablet by mouth daily.   metFORMIN 500 MG tablet Commonly known as:  GLUCOPHAGE Take 500 mg by mouth 2 (two) times daily with a meal.   ranitidine 150 MG tablet Commonly known as:  ZANTAC Take 150 mg by mouth daily.       Disposition and follow-up:   Mr.Luke Edwards was discharged from [facility] in Good condition.  At the hospital follow up visit please address:  1.  Please assess for ongoing chest pain and compliance with daily aspirin 81 mg.  Please discuss addition of statin therapy his medication regimen.  He was referred to outpatient cardiology for stress test and echocardiogram.  2.  Labs / imaging needed at time of follow-up: None   3.  Pending labs/ test needing follow-up: None   Follow-up Appointments: Follow-up Information    [facility] Follow up.   Specialty:  Cardiology Why:  12/11 at 1pm for an echo, 2pm for stress test. See after visit summary for test instructions. This will be at our [facility] location.   Contact information: [address] [place]       Isaiah Serge, NP Follow up on 03/28/2017.   Specialties:  Cardiology,  Radiology Why:  As scheduled above. Cecilie Kicks is one of the Nurse practitioner's with the  Cardiology group. This will be at our [facility] location. Contact information: [place] [address] [phone]        Raelyn Number, MD Follow up.   Specialty:  Internal Medicine Why:  Please make a hospital follow-up appointment with your regular doctor within 7 days of discharge date. Contact information: [address] [place] [address] [phone]           Hospital Course by problem list:  Luke Darrityis a 76 yo M with ahistoryof HTN, HLD,T2DM, and GERD who presented to the ED with chest painand admitted by internal medicine for chest pain rule out.   1. Atypical chest pain: Patient was admitted with substernal chest pain, similar to prior indigestion episodes, that was relieved by SL nitroglycerin.  His cardiac workup was negative including EKG without acute ischemia and troponin negative x3.  Cardiology consulted who recommended outpatient stress test and echocardiogram given risk factors for CAD including HTN, HLD, T2DM, and family history of heart disease in father and mother.  Daily aspirin 81 mg was started during this admission.  He was discharged home in stable condition and all of his home medications were resumed.  Will need addition of statin to his regimen, to discuss with PCP.   Discharge Vitals:   BP (!) 154/85 (BP Location: Left Arm)  Pulse 73   Temp 98 F (36.7 C) (Oral)   Resp 18   Ht 6\' 2"  (1.88 m)   Wt 205 lb 12.8 oz (93.4 kg)   SpO2 99%   BMI 26.42 kg/m   Pertinent Labs, Studies, and Procedures:   CXR 11/25: FINDINGS: Lung volumes are normal. No consolidative airspace disease. No pleural effusions. No pneumothorax. No pulmonary nodule or mass noted. Pulmonary vasculature and the cardiomediastinal silhouette are within normal limits.  IMPRESSION: No radiographic evidence of acute cardiopulmonary  disease.  Discharge Instructions: Discharge Instructions    Diet Carb Modified   Complete by:  As directed    Discharge instructions   Complete by:  As directed    You were admitted to [facility] due to chest pain.  Blood work showed you did not have a heart attack. However your chest pain could still be coming from the heart and you will need further evaluation for this.  You have been scheduled to have a stress test and an echocardiogram (ultrasound of the heart) for this.  Please take aspirin 81 mg daily.  Please discuss starting a cholesterol medication with your regular doctor.  Please return to the ED if you experience similar chest pain.   Increase activity slowly   Complete by:  As directed       Signed: Welford Roche, MD 03/06/2017, 1:10 PM   Pager: [phone]

## 2017-03-06 NOTE — Discharge Instructions (Signed)
° °  You have a Stress Test scheduled at [facility]. Your doctor has ordered this test to check the blood flow in your heart arteries.  Please arrive 15 minutes early for paperwork. The whole test will take several hours. You may want to bring reading material to remain occupied while undergoing different parts of the test.  Instructions:  No food/drink after midnight the night before.  It is OK to take your morning meds with a sip of water EXCEPT for those types of medicines listed below or otherwise instructed.  No caffeine/decaf products 24 hours before, including medicines such as Excedrin or Goody Powders. Call if there are any questions.   Wear comfortable clothes and shoes.   Special Medication Instructions:  Beta blockers such as metoprolol (Lopressor/Toprol XL), atenolol (Tenormin), carvedilol (Coreg), nebivolol (Bystolic), bisoprolol (Zebeta), propranolol (Inderal) should not be taken for 24 hours before the test.  Calcium channel blockers such as diltiazem (Cardizem) or verapmil (Calan) should not be taken for 24 hours before the test.  Remove nitroglycerin patches and do not take nitrate preparations such as Imdur/isosorbide the day of your test.  No Persantine/Theophylline or Aggrenox medicines should be used within 24 hours of the test.   If you are diabetic, hold your oral medicines the day of the test so that your blood sugar does not drop.  What To Expect: You will be walking on a treadmill at different paces to try to get your heart rate to a goal number that is based on your age. Your blood pressure and heart rate will be monitored, and we will be watching your EKG on a computer screen for any changes. The doctor reading the test will compare the before-and-after EKGs to look for evidence of heart blockages.

## 2017-03-06 NOTE — Care Management Obs Status (Signed)
[facility] NOTIFICATION   Patient Details  Name: Luke Edwards MRN: [id number] Date of Birth: May 26, 1940   Medicare Observation Status Notification Given:  Yes    Bethena Roys, RN 03/06/2017, 12:10 PM

## 2017-03-16 ENCOUNTER — Other Ambulatory Visit (HOSPITAL_COMMUNITY): Payer: Medicare HMO

## 2017-03-21 ENCOUNTER — Other Ambulatory Visit (HOSPITAL_COMMUNITY): Payer: Medicare HMO

## 2017-03-22 ENCOUNTER — Encounter: Payer: Self-pay | Admitting: Cardiology

## 2017-03-23 ENCOUNTER — Other Ambulatory Visit: Payer: Self-pay

## 2017-03-23 ENCOUNTER — Ambulatory Visit (HOSPITAL_COMMUNITY): Payer: Medicare HMO | Attending: Cardiovascular Disease

## 2017-03-23 ENCOUNTER — Ambulatory Visit (INDEPENDENT_AMBULATORY_CARE_PROVIDER_SITE_OTHER): Payer: Medicare HMO

## 2017-03-23 DIAGNOSIS — I5030 Unspecified diastolic (congestive) heart failure: Secondary | ICD-10-CM | POA: Insufficient documentation

## 2017-03-23 DIAGNOSIS — R079 Chest pain, unspecified: Secondary | ICD-10-CM

## 2017-03-23 DIAGNOSIS — I061 Rheumatic aortic insufficiency: Secondary | ICD-10-CM | POA: Insufficient documentation

## 2017-03-23 LAB — EXERCISE TOLERANCE TEST
CHL CUP RESTING HR STRESS: 78 {beats}/min
CHL RATE OF PERCEIVED EXERTION: 14
CSEPED: 3 min
CSEPEDS: 0 s
CSEPEW: 4.6 METS
CSEPPHR: 155 {beats}/min
MPHR: 144 {beats}/min
Percent HR: 107 %

## 2017-03-27 NOTE — Progress Notes (Signed)
Cardiology Office Note   Date:  03/28/2017   ID:  Luke Edwards, DOB 1941-03-18, MRN [id number]  PCP:  Raelyn Number, MD  Cardiologist:    Dr. Sallyanne Kuster   Chief Complaint  Patient presents with  . Hospitalization Follow-up    Chest pain      History of Present Illness: Luke Edwards is a 76 y.o. male who presents for post hospitalization for chest pain.  NTG with relief neg for MI, troponins neg.   He has no known history of CAD, but a hx of HTN, HLD, DM Type II, and GERD   ETT neg for ischemia Echo with EF 55-60% mild LVH, G1 DD PA pk pressure 35 mmHg.   Today no further chest pain or SOB.  Pt is active.  May have be reflux that caused the pain.  We reviewed the ETT and echo.  All very reassuring.  Questions answered.   Past Medical History:  Diagnosis Date  . Diabetes mellitus without complication ([place])   . GERD (gastroesophageal reflux disease)   . HLD (hyperlipidemia)   . Hypertension     Past Surgical History:  Procedure Laterality Date  . CHOLECYSTECTOMY       Current Outpatient Medications  Medication Sig Dispense Refill  . aspirin 81 MG chewable tablet Chew 1 tablet (81 mg total) by mouth daily. 30 tablet 2  . glipiZIDE (GLUCOTROL XL) 10 MG 24 hr tablet Take 1 tablet by mouth daily.    Marland Kitchen lisinopril-hydrochlorothiazide (PRINZIDE,ZESTORETIC) 20-25 MG per tablet Take 1 tablet by mouth daily.    . metFORMIN (GLUCOPHAGE) 1000 MG tablet Take 1 tablet by mouth 2 (two) times daily.    . ranitidine (ZANTAC) 150 MG tablet Take 150 mg by mouth daily.     No current facility-administered medications for this visit.     Allergies:   Patient has no known allergies.    Social History:  The patient  reports that  has never smoked. he has never used smokeless tobacco. He reports that he drinks about 1.2 oz of alcohol per week. He reports that he does not use drugs.   Family History:  The patient's family history includes Heart attack in his father; Valvular  heart disease in his mother.    ROS:  General:no colds or fevers, no weight changes Skin:no rashes or ulcers HEENT:no blurred vision, no congestion CV:see HPI PUL:see HPI GI:no diarrhea constipation or melena, no indigestion GU:no hematuria, no dysuria MS:no joint pain, no claudication Neuro:no syncope, no lightheadedness Endo:no diabetes, no thyroid disease--labs followed by PCP  Wt Readings from Last 3 Encounters:  03/28/17 214 lb 6.4 oz (97.3 kg)  03/05/17 205 lb 12.8 oz (93.4 kg)  08/09/13 216 lb (98 kg)     PHYSICAL EXAM: VS:  BP 140/82   Pulse 77   Resp 16   Ht 6\' 2"  (1.88 m)   Wt 214 lb 6.4 oz (97.3 kg)   BMI 27.53 kg/m  , BMI Body mass index is 27.53 kg/m. General:Pleasant affect, NAD Skin:Warm and dry, brisk capillary refill HEENT:normocephalic, sclera clear, mucus membranes moist Neck:supple, no JVD, no bruits  Heart:S1S2 RRR without murmur, gallup, rub or click Lungs:clear without rales, rhonchi, or wheezes QVZ:DGLO, non tender, + BS, do not palpate liver spleen or masses Ext:no lower ext edema, 2+ pedal pulses, 2+ radial pulses Neuro:alert and oriented, MAE, follows commands, + facial symmetry    EKG:  EKG is NOT ordered today.    Recent Labs: 03/05/2017: Hemoglobin  12.2; Platelets 244 03/06/2017: BUN 12; Creatinine, Ser 1.22; Potassium 3.9; Sodium 136    Lipid Panel No results found for: CHOL, TRIG, HDL, CHOLHDL, VLDL, LDLCALC, LDLDIRECT     Other studies Reviewed: Additional studies/ records that were reviewed today include: . ETT 03/23/17 Study Highlights    Blood pressure demonstrated a normal response to exercise.  There was no ST segment deviation noted during stress.  Clinically and electrically negative for ischemia.     ECHO 03/23/17 Study Conclusions  - Left ventricle: The cavity size was normal. Wall thickness was   increased in a pattern of mild LVH. There was focal basal   hypertrophy. Systolic function was normal.  The estimated ejection   fraction was in the range of 55% to 60%. Wall motion was normal;   there were no regional wall motion abnormalities. Doppler   parameters are consistent with abnormal left ventricular   relaxation (grade 1 diastolic dysfunction). - Aortic valve: There was mild regurgitation. - Pulmonary arteries: PA peak pressure: 35 mm Hg (S).   ASSESSMENT AND PLAN:  1.  Chest pain with neg MI and normal ETT.  Most likely GERD.  Echo with normal LV function.  Mild LVH due to HTN.  Will have pt follow up PRN with Dr. Jerilynn Mages. [place].  2.  HTN today elevated but he admits to white coat syndrome --at home today BP 122/79.  3. Labs per his PCP   Current medicines are reviewed with the patient today.  The patient Has no concerns regarding medicines.  The following changes have been made:  See above Labs/ tests ordered today include:see above  Disposition:   FU:  see above  Signed, Cecilie Kicks, NP  03/28/2017 9:50 AM    [address], [place], [place] [address] [place], [place] Phone: [phone]; Fax: [phone]

## 2017-03-28 ENCOUNTER — Encounter: Payer: Self-pay | Admitting: Cardiology

## 2017-03-28 ENCOUNTER — Ambulatory Visit: Payer: Medicare HMO | Admitting: Cardiology

## 2017-03-28 VITALS — BP 140/82 | HR 77 | Resp 16 | Ht 74.0 in | Wt 214.4 lb

## 2017-03-28 DIAGNOSIS — R079 Chest pain, unspecified: Secondary | ICD-10-CM

## 2017-03-28 DIAGNOSIS — I10 Essential (primary) hypertension: Secondary | ICD-10-CM | POA: Diagnosis not present

## 2017-03-28 NOTE — Patient Instructions (Signed)
Follow up as needed with Dr. Jerilynn Mages. [place]   .

## 2017-08-12 ENCOUNTER — Encounter (HOSPITAL_COMMUNITY): Payer: Self-pay | Admitting: Emergency Medicine

## 2017-08-12 ENCOUNTER — Emergency Department (HOSPITAL_COMMUNITY): Payer: Medicare HMO

## 2017-08-12 ENCOUNTER — Emergency Department (HOSPITAL_COMMUNITY)
Admission: EM | Admit: 2017-08-12 | Discharge: 2017-08-12 | Disposition: A | Discharge status: Home / Self Care | Payer: Medicare HMO | Attending: Emergency Medicine | Admitting: Emergency Medicine

## 2017-08-12 ENCOUNTER — Other Ambulatory Visit: Payer: Self-pay

## 2017-08-12 DIAGNOSIS — S62619A Displaced fracture of proximal phalanx of unspecified finger, initial encounter for closed fracture: Secondary | ICD-10-CM

## 2017-08-12 DIAGNOSIS — S6992XA Unspecified injury of left wrist, hand and finger(s), initial encounter: Secondary | ICD-10-CM | POA: Diagnosis present

## 2017-08-12 DIAGNOSIS — Y999 Unspecified external cause status: Secondary | ICD-10-CM | POA: Insufficient documentation

## 2017-08-12 DIAGNOSIS — I10 Essential (primary) hypertension: Secondary | ICD-10-CM | POA: Insufficient documentation

## 2017-08-12 DIAGNOSIS — S060X0A Concussion without loss of consciousness, initial encounter: Secondary | ICD-10-CM | POA: Diagnosis not present

## 2017-08-12 DIAGNOSIS — Z7982 Long term (current) use of aspirin: Secondary | ICD-10-CM | POA: Diagnosis not present

## 2017-08-12 DIAGNOSIS — Y939 Activity, unspecified: Secondary | ICD-10-CM | POA: Insufficient documentation

## 2017-08-12 DIAGNOSIS — Y929 Unspecified place or not applicable: Secondary | ICD-10-CM | POA: Diagnosis not present

## 2017-08-12 DIAGNOSIS — Z79899 Other long term (current) drug therapy: Secondary | ICD-10-CM | POA: Insufficient documentation

## 2017-08-12 DIAGNOSIS — S62641A Nondisplaced fracture of proximal phalanx of left index finger, initial encounter for closed fracture: Secondary | ICD-10-CM | POA: Insufficient documentation

## 2017-08-12 DIAGNOSIS — W01198A Fall on same level from slipping, tripping and stumbling with subsequent striking against other object, initial encounter: Secondary | ICD-10-CM | POA: Insufficient documentation

## 2017-08-12 DIAGNOSIS — Z7984 Long term (current) use of oral hypoglycemic drugs: Secondary | ICD-10-CM | POA: Insufficient documentation

## 2017-08-12 DIAGNOSIS — E119 Type 2 diabetes mellitus without complications: Secondary | ICD-10-CM | POA: Insufficient documentation

## 2017-08-12 MED ORDER — ACETAMINOPHEN 325 MG PO TABS
650.0000 mg | ORAL_TABLET | Freq: Once | ORAL | Status: AC
  Administered 2017-08-12: 650 mg via ORAL
  Filled 2017-08-12: qty 2

## 2017-08-12 MED ORDER — TRAMADOL HCL 50 MG PO TABS: 50.0000 mg | ORAL_TABLET | Freq: Four times a day (QID) | ORAL | 0 refills | Status: DC | PRN

## 2017-08-12 NOTE — ED Provider Notes (Signed)
[facility] EMERGENCY DEPARTMENT Provider Note   CSN: [id number] Arrival date & time: 08/12/17  1028     History   Chief Complaint Chief Complaint  Patient presents with  . Hand Injury  . Facial Pain    HPI Luke Edwards is a 77 y.o. male.  HPI   77 year old male with history of diabetes, hypertension, hyperlipidemia, GERD presenting for evaluation of recent fall.  Patient report he had a mechanical fall when he tripped on some uneven surface and fell forward 3 days ago.  He struck his face against a hard surface and perhaps recheck of his left hand to break his fall.  He did suffer skin abrasion to the left side of his face as well as significant pain to his left hand.  Went to urgent care on the same day for his condition.  He report no significant treatment was giving aside from a tetanus shot updated.  Since the fall, he has had pain to his left hand.  Pain is sharp throbbing involving the palms of hand worsening with movement.  He also noticed increased swelling.  No associated numbness or weakness.  He does endorse some mild lightheadedness and left-sided facial pain but that has since improved.  No report of nausea or vomiting, planes of vision changes, neck pain, and no precipitating symptoms prior to the fall.  He is not on any blood thinner medication except for baby aspirin.  He is right-hand dominant.  Past Medical History:  Diagnosis Date  . Diabetes mellitus without complication ([place])   . GERD (gastroesophageal reflux disease)   . HLD (hyperlipidemia)   . Hypertension     Patient Active Problem List   Diagnosis Date Noted  . Chest pain 03/05/2017    Past Surgical History:  Procedure Laterality Date  . CHOLECYSTECTOMY          Home Medications    Prior to Admission medications   Medication Sig Start Date End Date Taking? Authorizing Provider  aspirin 81 MG chewable tablet Chew 1 tablet (81 mg total) by mouth daily. 03/07/17    Santos-Sanchez, Merlene Morse, MD  glipiZIDE (GLUCOTROL XL) 10 MG 24 hr tablet Take 1 tablet by mouth daily. 03/10/17   [provider]  lisinopril-hydrochlorothiazide (PRINZIDE,ZESTORETIC) 20-25 MG per tablet Take 1 tablet by mouth daily.    [provider]  metFORMIN (GLUCOPHAGE) 1000 MG tablet Take 1 tablet by mouth 2 (two) times daily. 02/26/17   [provider]  ranitidine (ZANTAC) 150 MG tablet Take 150 mg by mouth daily.    [provider]    Family History Family History  Problem Relation Age of Onset  . Valvular heart disease Mother   . Heart attack Father        16's    Social History Social History   Tobacco Use  . Smoking status: Never Smoker  . Smokeless tobacco: Never Used  Substance Use Topics  . Alcohol use: Yes    Alcohol/week: 1.2 oz    Types: 2 Cans of beer per week    Comment: Once during the week   . Drug use: No     Allergies   Patient has no known allergies.   Review of Systems Review of Systems  All other systems reviewed and are negative.    Physical Exam Updated Vital Signs BP (!) 148/85 (BP Location: Right Arm)   Pulse 72   Temp 98.9 F (37.2 C) (Oral)   Resp 16  Ht 6\' 2"  (1.88 m)   Wt 97.1 kg (214 lb)   SpO2 94%   BMI 27.48 kg/m   Physical Exam  Constitutional: He appears well-developed and well-nourished. No distress.  HENT:  Head: Atraumatic.  No hemotympanum, no septal hematoma, no malocclusion, no midface tenderness.  They are slight ecchymosis and abrasion noted to left zygomatic arch with tenderness to palpation but no signs of infection.  Eyes: Pupils are equal, round, and reactive to light. Conjunctivae and EOM are normal.  Neck: Normal range of motion. Neck supple.  No significant midline cervical spine tenderness.  Cardiovascular: Normal rate and regular rhythm.  Pulmonary/Chest: Effort normal and breath sounds normal.  Musculoskeletal: He exhibits tenderness (Left hand: Tenderness noted  to third and fourth MCP on palpation with surrounding erythema and edema.  Normal grip strength, brisk cap refill history of fingers without obvious deformity.  Radial pulse 2+.).  Left wrist and left elbow nontender.  Neurological: He is alert.  Skin: No rash noted.  Psychiatric: He has a normal mood and affect.  Nursing note and vitals reviewed.    ED Treatments / Results  Labs (all labs ordered are listed, but only abnormal results are displayed) Labs Reviewed - No data to display  EKG None  Radiology No results found.  Procedures Procedures (including critical care time)  Medications Ordered in ED Medications - No data to display   Initial Impression / Assessment and Plan / ED Course  I have reviewed the triage vital signs and the nursing notes.  Pertinent labs & imaging results that were available during my care of the patient were reviewed by me and considered in my medical decision making (see chart for details).     BP (!) 148/85 (BP Location: Right Arm)   Pulse 72   Temp 98.9 F (37.2 C) (Oral)   Resp 16   Ht 6\' 2"  (1.88 m)   Wt 97.1 kg (214 lb)   SpO2 94%   BMI 27.48 kg/m    Final Clinical Impressions(s) / ED Diagnoses   Final diagnoses:  Closed avulsion fracture of proximal phalanx of finger, initial encounter  Concussion without loss of consciousness, initial encounter    ED Discharge Orders        Ordered    traMADol (ULTRAM) 50 MG tablet  Every 6 hours PRN     08/12/17 1358     12:04 PM Patient had a mechanical fall a few days ago.  He suffered an abrasion to the left side of his face and did report some mild lightheadedness without loss of consciousness.  Given his age, will obtain a maxillofacial CT scan for further evaluation.  No significant headache therefore I have low suspicion for intracranial injury.  He has no scalp tenderness.  He is mentating appropriately without any focal neuro deficit.  He also complaining of injury to his left  hand.  On exam, he does have swelling noted to the dorsal aspects of the hand most significant to the third and fourth MCP.  X-ray obtained demonstrate a questionable avulsion fracture involving the third proximal phalanx.  Given this finding, I will place a hand splint to provide protection and patient will benefit from follow-up with hand specialist for further management of his condition.  Tylenol given for pain.  Care discussed with Dr. Ashok Cordia.  1:56 PM Maxillofacial CT scan without acute fracture.  Evidence of sinusitis and atrophy of the brain. Pt given volar splint for  Hand.  Pain medication  prescribed and outpt f/u recommended.  Diagnosed with concussion due to residual sxs after head injury .  In order to decrease risk of narcotic abuse. Pt's record were checked using the [place] Controlled Substance database.       Domenic Moras, PA-C 08/12/17 1401    Lajean Saver, MD 08/12/17 [phone]

## 2017-08-12 NOTE — ED Notes (Signed)
Patient transported to CT 

## 2017-08-12 NOTE — ED Triage Notes (Signed)
Pt. Stated, I tripped and fell on a grinder. Injured left hand and left side of face.  Happened Thursday and I went to [place]. My hand keeps swelling.

## 2019-03-16 IMAGING — DX DG CHEST 2V
2 series · 2 of 2 positions shown · non-contrast
Comparison: Chest x-ray 03/05/2017.

CLINICAL DATA: 76-year-old male with history of epigastric and
substernal chest pain.

EXAM:
CHEST  2 VIEW

[w chest lat]
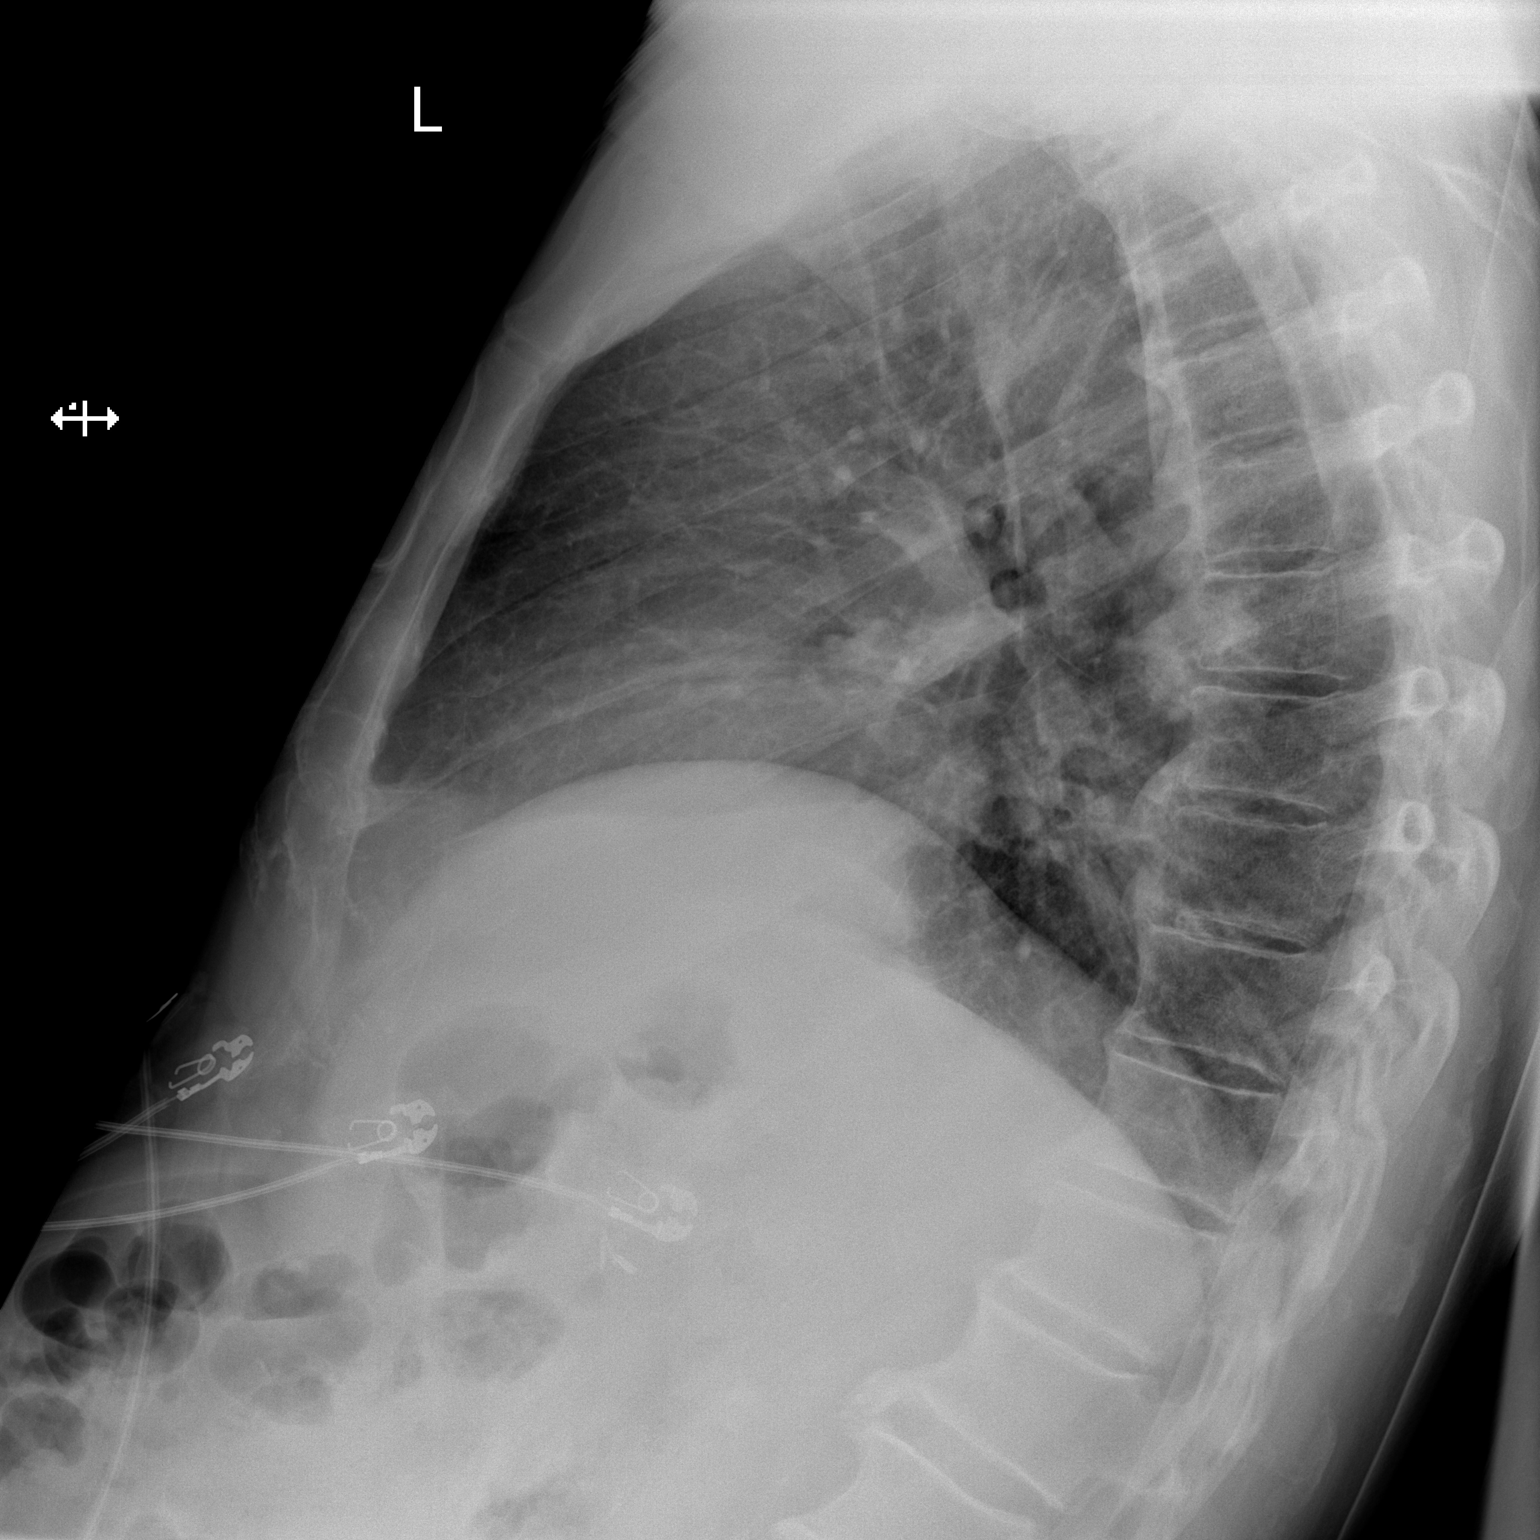

[x chest ap]
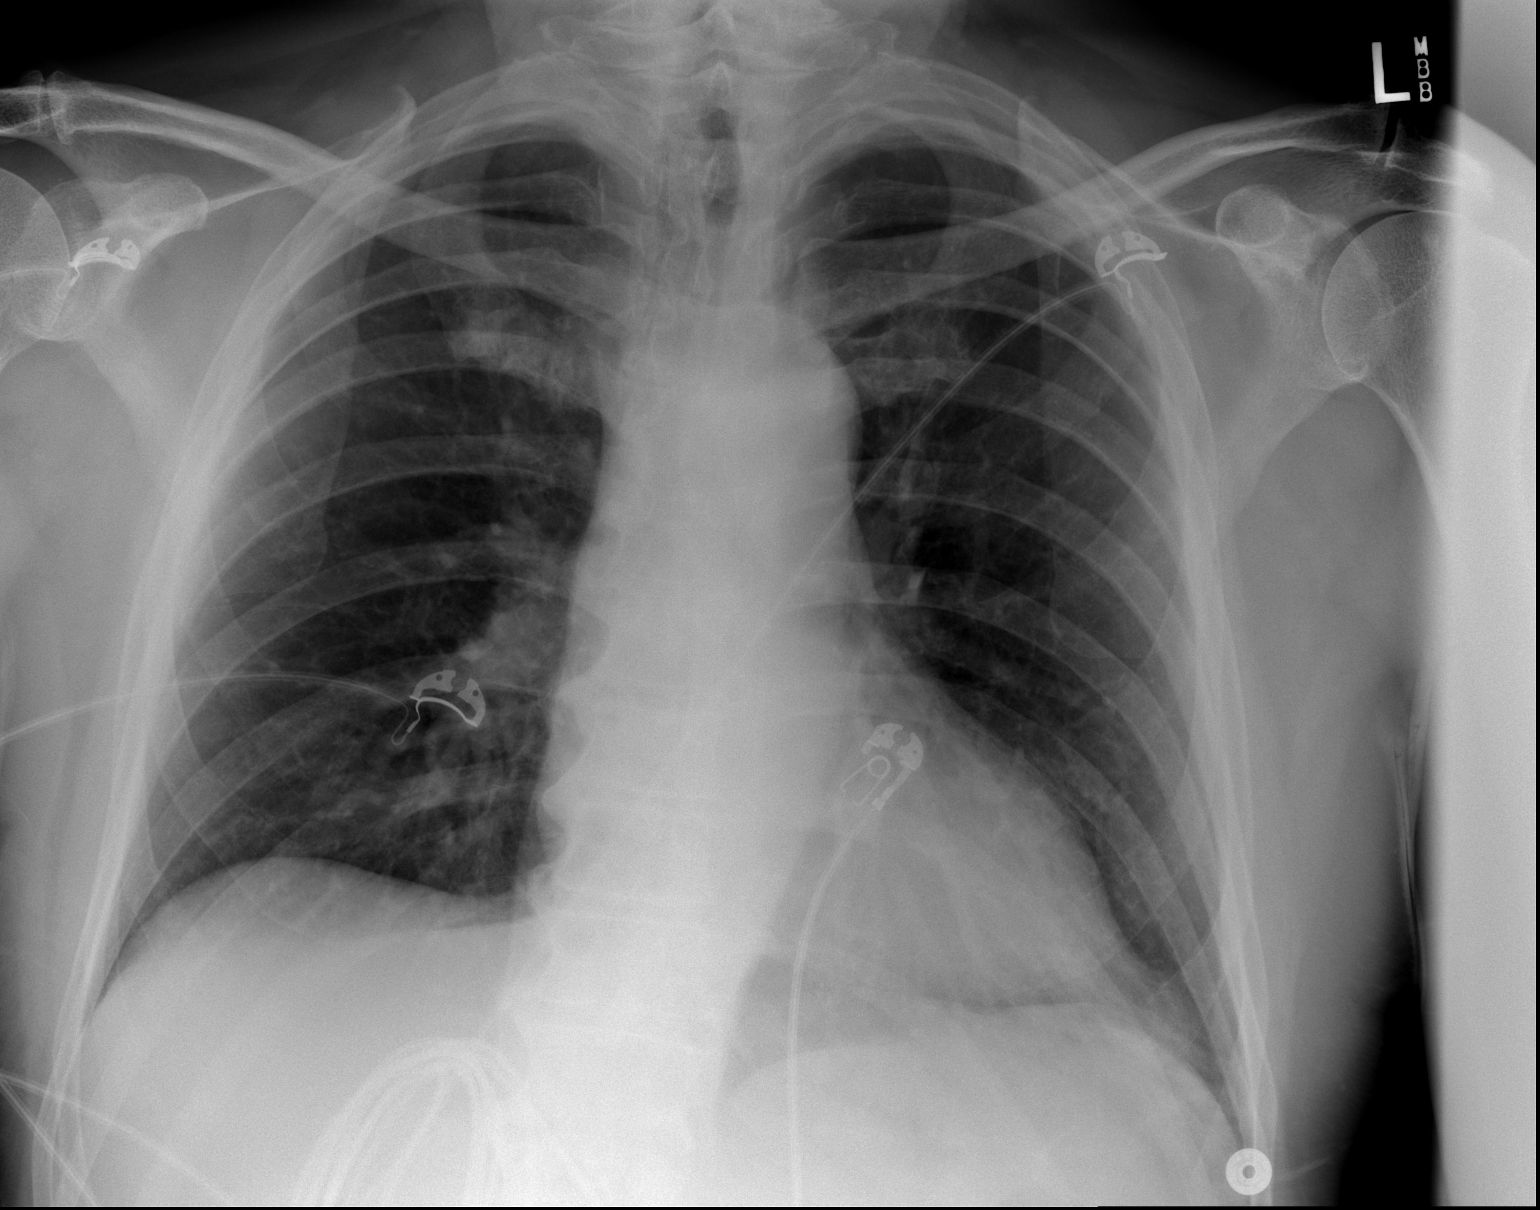

[2 of 2 positions shown; findings below may reference images not displayed]

FINDINGS: Lung volumes are normal. No consolidative airspace disease. No
pleural effusions. No pneumothorax. No pulmonary nodule or mass
noted. Pulmonary vasculature and the cardiomediastinal silhouette
are within normal limits.
IMPRESSION: No radiographic evidence of acute cardiopulmonary disease.

## 2019-04-29 ENCOUNTER — Emergency Department (HOSPITAL_COMMUNITY): Payer: Medicare HMO

## 2019-04-29 ENCOUNTER — Inpatient Hospital Stay (HOSPITAL_COMMUNITY)
Admission: EM | Admit: 2019-04-29 | Discharge: 2019-05-03 | DRG: 177 | Disposition: A | Discharge status: Home / Self Care | Payer: Medicare HMO | Attending: Internal Medicine | Admitting: Internal Medicine

## 2019-04-29 ENCOUNTER — Other Ambulatory Visit: Payer: Self-pay

## 2019-04-29 DIAGNOSIS — Z7982 Long term (current) use of aspirin: Secondary | ICD-10-CM

## 2019-04-29 DIAGNOSIS — Z8249 Family history of ischemic heart disease and other diseases of the circulatory system: Secondary | ICD-10-CM

## 2019-04-29 DIAGNOSIS — N400 Enlarged prostate without lower urinary tract symptoms: Secondary | ICD-10-CM | POA: Diagnosis present

## 2019-04-29 DIAGNOSIS — N179 Acute kidney failure, unspecified: Secondary | ICD-10-CM | POA: Diagnosis present

## 2019-04-29 DIAGNOSIS — U071 COVID-19: Principal | ICD-10-CM

## 2019-04-29 DIAGNOSIS — I10 Essential (primary) hypertension: Secondary | ICD-10-CM | POA: Diagnosis present

## 2019-04-29 DIAGNOSIS — K219 Gastro-esophageal reflux disease without esophagitis: Secondary | ICD-10-CM | POA: Diagnosis present

## 2019-04-29 DIAGNOSIS — J80 Acute respiratory distress syndrome: Secondary | ICD-10-CM

## 2019-04-29 DIAGNOSIS — E860 Dehydration: Secondary | ICD-10-CM | POA: Diagnosis present

## 2019-04-29 DIAGNOSIS — Z79899 Other long term (current) drug therapy: Secondary | ICD-10-CM | POA: Diagnosis not present

## 2019-04-29 DIAGNOSIS — J9601 Acute respiratory failure with hypoxia: Secondary | ICD-10-CM

## 2019-04-29 DIAGNOSIS — T380X5A Adverse effect of glucocorticoids and synthetic analogues, initial encounter: Secondary | ICD-10-CM | POA: Diagnosis present

## 2019-04-29 DIAGNOSIS — Z20822 Contact with and (suspected) exposure to covid-19: Secondary | ICD-10-CM | POA: Diagnosis present

## 2019-04-29 DIAGNOSIS — E785 Hyperlipidemia, unspecified: Secondary | ICD-10-CM

## 2019-04-29 DIAGNOSIS — E1165 Type 2 diabetes mellitus with hyperglycemia: Secondary | ICD-10-CM | POA: Diagnosis present

## 2019-04-29 DIAGNOSIS — Z794 Long term (current) use of insulin: Secondary | ICD-10-CM | POA: Diagnosis not present

## 2019-04-29 DIAGNOSIS — J1282 Pneumonia due to coronavirus disease 2019: Secondary | ICD-10-CM | POA: Diagnosis present

## 2019-04-29 DIAGNOSIS — E871 Hypo-osmolality and hyponatremia: Secondary | ICD-10-CM | POA: Diagnosis present

## 2019-04-29 HISTORY — DX: COVID-19: U07.1

## 2019-04-29 HISTORY — DX: Acute respiratory distress syndrome: J80

## 2019-04-29 LAB — CBC
HCT: 39.1 % (ref 39.0–52.0)
Hemoglobin: 13.3 g/dL (ref 13.0–17.0)
MCH: 31 pg (ref 26.0–34.0)
MCHC: 34 g/dL (ref 30.0–36.0)
MCV: 91.1 fL (ref 80.0–100.0)
Platelets: 245 10*3/uL (ref 150–400)
RBC: 4.29 MIL/uL (ref 4.22–5.81)
RDW: 12.7 % (ref 11.5–15.5)
WBC: 6.2 10*3/uL (ref 4.0–10.5)
nRBC: 0 % (ref 0.0–0.2)

## 2019-04-29 LAB — LACTIC ACID, PLASMA
Lactic Acid, Venous: 1.9 mmol/L (ref 0.5–1.9)
Lactic Acid, Venous: 1.4 mmol/L (ref 0.5–1.9)

## 2019-04-29 LAB — PROCALCITONIN: Procalcitonin: <0.1 ng/mL

## 2019-04-29 LAB — BASIC METABOLIC PANEL
Anion gap: 14 (ref 5–15)
BUN: 25 mg/dL — ABNORMAL HIGH (ref 8–23)
CO2: 20 mmol/L — ABNORMAL LOW (ref 22–32)
Calcium: 8.3 mg/dL — ABNORMAL LOW (ref 8.9–10.3)
Chloride: 96 mmol/L — ABNORMAL LOW (ref 98–111)
Creatinine, Ser: 1.6 mg/dL — ABNORMAL HIGH (ref 0.61–1.24)
GFR calc Af Amer: 47 mL/min — ABNORMAL LOW (ref >60)
GFR calc non Af Amer: 41 mL/min — ABNORMAL LOW (ref >60)
Glucose, Bld: 315 mg/dL — ABNORMAL HIGH (ref 70–99)
Potassium: 4.5 mmol/L (ref 3.5–5.1)
Sodium: 130 mmol/L — ABNORMAL LOW (ref 135–145)

## 2019-04-29 LAB — LACTATE DEHYDROGENASE: LDH: 241 U/L — ABNORMAL HIGH (ref 98–192)

## 2019-04-29 LAB — FERRITIN: Ferritin: 354 ng/mL — ABNORMAL HIGH (ref 24–336)

## 2019-04-29 LAB — C-REACTIVE PROTEIN: CRP: 13.3 mg/dL — ABNORMAL HIGH (ref <1.0)

## 2019-04-29 LAB — HEPATIC FUNCTION PANEL
ALT: 25 U/L (ref 0–44)
AST: 30 U/L (ref 15–41)
Albumin: 3.6 g/dL (ref 3.5–5.0)
Alkaline Phosphatase: 52 U/L (ref 38–126)
Bilirubin, Direct: 0.2 mg/dL (ref 0.0–0.2)
Indirect Bilirubin: 0.7 mg/dL (ref 0.3–0.9)
Total Bilirubin: 0.9 mg/dL (ref 0.3–1.2)
Total Protein: 7.9 g/dL (ref 6.5–8.1)

## 2019-04-29 LAB — FIBRINOGEN: Fibrinogen: 658 mg/dL — ABNORMAL HIGH (ref 210–475)

## 2019-04-29 LAB — ABO/RH: ABO/RH(D): O NEG

## 2019-04-29 LAB — HEMOGLOBIN A1C
Hgb A1c MFr Bld: 9 % — ABNORMAL HIGH (ref 4.8–5.6)
Mean Plasma Glucose: 211.6 mg/dL

## 2019-04-29 LAB — CBG MONITORING, ED
Glucose-Capillary: 285 mg/dL — ABNORMAL HIGH (ref 70–99)
Glucose-Capillary: 248 mg/dL — ABNORMAL HIGH (ref 70–99)

## 2019-04-29 LAB — TRIGLYCERIDES: Triglycerides: 93 mg/dL (ref <150)

## 2019-04-29 LAB — POC SARS CORONAVIRUS 2 AG -  ED: SARS Coronavirus 2 Ag: POSITIVE — AB

## 2019-04-29 LAB — GLUCOSE, CAPILLARY: Glucose-Capillary: 262 mg/dL — ABNORMAL HIGH (ref 70–99)

## 2019-04-29 LAB — D-DIMER, QUANTITATIVE: D-Dimer, Quant: 0.8 ug/mL-FEU — ABNORMAL HIGH (ref 0.00–0.50)

## 2019-04-29 MED ORDER — ALBUTEROL SULFATE HFA 108 (90 BASE) MCG/ACT IN AERS
2.0000 | INHALATION_SPRAY | Freq: Two times a day (BID) | RESPIRATORY_TRACT | Status: DC
  Administered 2019-04-30 (×2): 2 via RESPIRATORY_TRACT

## 2019-04-29 MED ORDER — SODIUM CHLORIDE 0.9 % IV SOLN
200.0000 mg | Freq: Once | INTRAVENOUS | Status: AC
  Administered 2019-04-29: 200 mg via INTRAVENOUS
  Filled 2019-04-29: qty 40

## 2019-04-29 MED ORDER — LABETALOL HCL 100 MG PO TABS: 100.0000 mg | ORAL_TABLET | Freq: Three times a day (TID) | ORAL | Status: DC | PRN

## 2019-04-29 MED ORDER — INSULIN ASPART 100 UNIT/ML ~~LOC~~ SOLN
0.0000 [IU] | Freq: Three times a day (TID) | SUBCUTANEOUS | Status: DC
  Administered 2019-04-29: 5 [IU] via SUBCUTANEOUS
  Administered 2019-04-30: 8 [IU] via SUBCUTANEOUS
  Administered 2019-04-30 (×2): 11 [IU] via SUBCUTANEOUS
  Administered 2019-05-01 (×2): 15 [IU] via SUBCUTANEOUS

## 2019-04-29 MED ORDER — SENNOSIDES-DOCUSATE SODIUM 8.6-50 MG PO TABS: 1.0000 | ORAL_TABLET | Freq: Every evening | ORAL | Status: DC | PRN

## 2019-04-29 MED ORDER — FAMOTIDINE 20 MG PO TABS
20.0000 mg | ORAL_TABLET | Freq: Every day | ORAL | Status: DC
  Administered 2019-04-30 – 2019-05-03 (×4): 20 mg via ORAL
  Filled 2019-04-29 (×4): qty 1

## 2019-04-29 MED ORDER — ACETAMINOPHEN 325 MG PO TABS: 650.0000 mg | ORAL_TABLET | Freq: Four times a day (QID) | ORAL | Status: DC | PRN

## 2019-04-29 MED ORDER — SODIUM CHLORIDE 0.9 % IV SOLN: INTRAVENOUS | Status: AC

## 2019-04-29 MED ORDER — ALBUTEROL SULFATE HFA 108 (90 BASE) MCG/ACT IN AERS
2.0000 | INHALATION_SPRAY | Freq: Four times a day (QID) | RESPIRATORY_TRACT | Status: DC
  Administered 2019-04-29 (×2): 2 via RESPIRATORY_TRACT
  Filled 2019-04-29: qty 6.7

## 2019-04-29 MED ORDER — ALBUTEROL SULFATE HFA 108 (90 BASE) MCG/ACT IN AERS
2.0000 | INHALATION_SPRAY | RESPIRATORY_TRACT | Status: DC | PRN
  Administered 2019-05-01: 2 via RESPIRATORY_TRACT

## 2019-04-29 MED ORDER — GUAIFENESIN-DM 100-10 MG/5ML PO SYRP: 10.0000 mL | ORAL_SOLUTION | ORAL | Status: DC | PRN

## 2019-04-29 MED ORDER — TRAMADOL HCL 50 MG PO TABS: 50.0000 mg | ORAL_TABLET | Freq: Four times a day (QID) | ORAL | Status: DC | PRN

## 2019-04-29 MED ORDER — SODIUM CHLORIDE 0.9 % IV SOLN
100.0000 mg | Freq: Every day | INTRAVENOUS | Status: AC
  Administered 2019-04-30 – 2019-05-03 (×4): 100 mg via INTRAVENOUS
  Filled 2019-04-29 (×4): qty 20

## 2019-04-29 MED ORDER — DEXAMETHASONE 6 MG PO TABS
6.0000 mg | ORAL_TABLET | ORAL | Status: DC
  Administered 2019-04-29 – 2019-04-30 (×2): 6 mg via ORAL
  Filled 2019-04-29: qty 1
  Filled 2019-04-29: qty 2

## 2019-04-29 MED ORDER — HEPARIN SODIUM (PORCINE) 5000 UNIT/ML IJ SOLN
5000.0000 [IU] | Freq: Three times a day (TID) | INTRAMUSCULAR | Status: DC
  Administered 2019-04-29 – 2019-05-01 (×5): 5000 [IU] via SUBCUTANEOUS
  Filled 2019-04-29 (×5): qty 1

## 2019-04-29 MED ORDER — ASPIRIN 81 MG PO CHEW
81.0000 mg | CHEWABLE_TABLET | Freq: Every day | ORAL | Status: DC
  Administered 2019-04-30 – 2019-05-03 (×4): 81 mg via ORAL
  Filled 2019-04-29 (×4): qty 1

## 2019-04-29 NOTE — ED Provider Notes (Signed)
[facility] EMERGENCY DEPARTMENT Provider Note   CSN: [id number] Arrival date & time: 04/29/19  1016     History Chief Complaint  Patient presents with  . Shortness of Breath  . Fever  . Weakness    Orie Worstell is a 79 y.o. male with a past medical history history significant for diabetes, GERD, hyperlipidemia, and hypertension who presents to the ED due to gradual onset of worsening shortness of breath, fever, loss of taste and smell x2 weeks. Patient was evaluated at [facility] prior to arrival and was sent to the ED due to concern of pneumonia on his CXR. Patient admits intermittent shortness of breath worse with exertion. He also admits to intermittent fevers x 2 week. T. Max 100.6 yesterday. Patient endorses chills, dry cough, and decreased appetite. Patient denies sick contacts and known COVID exposures. Patient has been tested 3 times for COVID with the last being last Thursday which have all been negative. Patient admits to nausea, but denies abdominal pain, vomiting, and diarrhea. Patient denies urinary symptoms and back pain. Patient denies chest pain and lower extremity edema. Patient continuously notes that he is just dehydrated and needs fluids.      Past Medical History:  Diagnosis Date  . Diabetes mellitus without complication ([place])   . GERD (gastroesophageal reflux disease)   . HLD (hyperlipidemia)   . Hypertension     Patient Active Problem List   Diagnosis Date Noted  . Chest pain 03/05/2017    Past Surgical History:  Procedure Laterality Date  . CHOLECYSTECTOMY         Family History  Problem Relation Age of Onset  . Valvular heart disease Mother   . Heart attack Father        74's    Social History   Tobacco Use  . Smoking status: Never Smoker  . Smokeless tobacco: Never Used  Substance Use Topics  . Alcohol use: Yes    Alcohol/week: 2.0 standard drinks    Types: 2 Cans of beer per week    Comment: Once during the  week   . Drug use: No    Home Medications Prior to Admission medications   Medication Sig Start Date End Date Taking? Authorizing Provider  aspirin 81 MG chewable tablet Chew 1 tablet (81 mg total) by mouth daily. 03/07/17   Santos-Sanchez, Merlene Morse, MD  glipiZIDE (GLUCOTROL XL) 10 MG 24 hr tablet Take 1 tablet by mouth daily. 03/10/17   [provider]  lisinopril-hydrochlorothiazide (PRINZIDE,ZESTORETIC) 20-25 MG per tablet Take 1 tablet by mouth daily.    [provider]  metFORMIN (GLUCOPHAGE) 1000 MG tablet Take 1 tablet by mouth 2 (two) times daily. 02/26/17   [provider]  ranitidine (ZANTAC) 150 MG tablet Take 150 mg by mouth daily.    [provider]  traMADol (ULTRAM) 50 MG tablet Take 1 tablet (50 mg total) by mouth every 6 (six) hours as needed for moderate pain. 08/12/17   Domenic Moras, PA-C    Allergies    Patient has no known allergies.  Review of Systems   Review of Systems  Constitutional: Positive for chills and fever.  Respiratory: Positive for cough and shortness of breath.   Cardiovascular: Negative for chest pain and leg swelling.  Gastrointestinal: Positive for nausea. Negative for abdominal pain, diarrhea and vomiting.  Genitourinary: Negative for dysuria.  Musculoskeletal: Negative for back pain.  All other systems reviewed and are negative.   Physical Exam Updated Vital  Signs BP (!) 125/92   Pulse (!) 107   Temp 98 F (36.7 C) (Oral)   Resp (!) 21   Ht 6\' 2"  (1.88 m)   Wt 96.2 kg   SpO2 98%   BMI 27.22 kg/m   Physical Exam Vitals and nursing note reviewed.  Constitutional:      General: He is not in acute distress.    Appearance: He is ill-appearing.  HENT:     Head: Normocephalic.  Eyes:     Pupils: Pupils are equal, round, and reactive to light.  Neck:     Comments: No meningismus Cardiovascular:     Rate and Rhythm: Regular rhythm. Tachycardia present.     Pulses: Normal pulses.     Heart sounds:  Normal heart sounds. No murmur. No friction rub. No gallop.   Pulmonary:     Effort: Pulmonary effort is normal.     Breath sounds: Normal breath sounds.     Comments: Respirations equal with mild increased work of breathing, patient able to speak in full sentences, lungs with crackles at bilateral bases. On 3L [place] Abdominal:     General: Abdomen is flat. Bowel sounds are normal. There is no distension.     Palpations: Abdomen is soft.     Tenderness: There is no abdominal tenderness. There is no guarding or rebound.     Comments: Abdomen soft, nondistended, nontender to palpation in all quadrants without guarding or peritoneal signs. No rebound.   Musculoskeletal:     Cervical back: Neck supple.     Comments: Able to move all 4 extremities without difficulty. No lower extremity edema. Negative homans sign bilaterally  Neurological:     General: No focal deficit present.     Mental Status: He is alert.  Psychiatric:        Mood and Affect: Mood normal.        Behavior: Behavior normal.     ED Results / Procedures / Treatments   Labs (all labs ordered are listed, but only abnormal results are displayed) Labs Reviewed  BASIC METABOLIC PANEL - Abnormal; Notable for the following components:      Result Value   Sodium 130 (*)    Chloride 96 (*)    CO2 20 (*)    Glucose, Bld 315 (*)    BUN 25 (*)    Creatinine, Ser 1.60 (*)    Calcium 8.3 (*)    GFR calc non Af Amer 41 (*)    GFR calc Af Amer 47 (*)    All other components within normal limits  D-DIMER, QUANTITATIVE (NOT AT [facility]) - Abnormal; Notable for the following components:   D-Dimer, Quant 0.80 (*)    All other components within normal limits  LACTATE DEHYDROGENASE - Abnormal; Notable for the following components:   LDH 241 (*)    All other components within normal limits  FIBRINOGEN - Abnormal; Notable for the following components:   Fibrinogen 658 (*)    All other components within normal limits  CBG MONITORING, ED -  Abnormal; Notable for the following components:   Glucose-Capillary 285 (*)    All other components within normal limits  POC SARS CORONAVIRUS 2 AG -  ED - Abnormal; Notable for the following components:   SARS Coronavirus 2 Ag POSITIVE (*)    All other components within normal limits  CULTURE, BLOOD (ROUTINE X 2)  CULTURE, BLOOD (ROUTINE X 2)  CBC  LACTIC ACID, PLASMA  HEPATIC FUNCTION PANEL  TRIGLYCERIDES  LACTIC ACID, PLASMA  PROCALCITONIN  FERRITIN  C-REACTIVE PROTEIN    EKG EKG Interpretation  Date/Time:  Monday April 29 2019 10:28:01 EST Ventricular Rate:  116 PR Interval:  140 QRS Duration: 84 QT Interval:  310 QTC Calculation: 430 R Axis:   -32 Text Interpretation: Sinus tachycardia Left axis deviation Minimal voltage criteria for LVH, may be normal variant ( R in aVL ) Abnormal ECG Baseline wander Since last tracing rate faster Otherwise no significant change Confirmed by Daleen Bo [phone]) on 04/29/2019 1:03:58 PM   Radiology DG Chest Port 1 View  Result Date: 04/29/2019 CLINICAL DATA:  Fever EXAM: PORTABLE CHEST 1 VIEW COMPARISON:  Portable exam 1320 hours compared to 04/19/2017 FINDINGS: Normal heart size, mediastinal contours, and pulmonary vascularity. Minimal LEFT basilar atelectasis. Lungs otherwise clear. No acute infiltrate, pleural effusion or pneumothorax. Scattered endplate spur formation thoracic spine. IMPRESSION: Minimal LEFT basilar atelectasis. Electronically Signed   By: Lavonia Dana M.D.   On: 04/29/2019 13:28    Procedures Procedures (including critical care time)  Medications Ordered in ED Medications - No data to display  ED Course  I have reviewed the triage vital signs and the nursing notes.  Pertinent labs & imaging results that were available during my care of the patient were reviewed by me and considered in my medical decision making (see chart for details).  Clinical Course as of Apr 29 1543  Mon Apr 29, 2019  1404 Spoke to  patient's daughter to update her on patient's status   [CA]  1441 Patient seen by myself as well as PA provider.79 year old male presenting to the ED with 2 weeks of progressively worsening fatigue, malaise, nausea, shortness of breath.  Is found to be Covid positive on arrival here.  Is requiring 4 L nasal cannula.  He feels generally weak.  Is speaking full sentences on physical exam.  He does have crackles in bilateral bases.  Anticipate admission to the hospital.   [MT]  [place] Spoke to Dr. Roosevelt Locks who agrees to admit patient for further treatment.    [CA]    Clinical Course User Index [CA] Suzy Bouchard, PA-C [MT] Wyvonnia Dusky, MD   MDM Rules/Calculators/A&P                     79 year old male presents to the ED due to Covid-like symptoms of shortness of breath, fever, cough, and chills x2 weeks.  Patient was evaluated at [facility] urgent care prior to arrival and sent here due to concerns of pneumonia on chest x-ray.  Upon arrival patient is tachycardic at 122, tachypneic at 24, and requiring 3 L of nasal cannula.  Lungs with crackles at bilateral bases with increased work of breathing.  Patient speaking in full sentences.  No accessory muscle usage.  Abdomen soft, nondistended, nontender.  No lower extremity edema.  Negative Homans' sign bilaterally.  I personally turned off oxygen which made patient dropped down to 88% on room air.    Rapid Covid test positive.  Suspect symptoms related to COVID infection. CBC reassuring with no leukocytosis.  BMP significant for hyponatremia at 130, hyperglycemia at 315, AKI with creatinine at 1.6 and BUN at 25 likely due to dehydration.  No anion gap.  Doubt DKA. Patient was ambulated in the room by RN without [place] and dropped down to 88%, but rose to 94% with increased work of breathing. Will consult unassigned medicine to admit patient given hypoxia and new oxygen requirement.  [place] pre-admission labs ordered.   Spoke to [facility] [facility] who agrees to  evaluate and admit patient for further treatment.   Final Clinical Impression(s) / ED Diagnoses Final diagnoses:  Acute respiratory distress syndrome (ARDS) due to COVID-19 virus [facility])    Rx / DC Orders ED Discharge Orders    None       Karie Kirks 04/29/19 1618    Wyvonnia Dusky, MD 04/29/19 [phone]

## 2019-04-29 NOTE — ED Notes (Signed)
Family updated.

## 2019-04-29 NOTE — ED Notes (Signed)
Pt O2 sats dropped to 88% when taken off supplemental oxygen while sitting. While ambulating, O2 rose to 94%. Incr WOB.

## 2019-04-29 NOTE — H&P (Signed)
History and Physical    Luke Edwards [id number] DOB: [birth year] DOA: 04/29/2019  PCP: Bonnita Nasuti, MD   Patient coming from: Home I have personally briefly reviewed patient's old medical records in [place]  Chief Complaint: Feeling weak and dehydrated.  HPI: Luke Edwards is a 79 y.o. male with medical history significant of diabetes, GERD, hyperlipidemia, and hypertension who presents to the ED due to gradual onset of worsening shortness of breath, fever, loss of taste and smell x2 weeks with intermittent shortness of breath on exertion, and occasional dry cough or chest pains. He also admits to intermittent fevers x 2 week. T. Max 100.6 yesterday. Patient denies sick contacts and known COVID exposures. Patient has been tested COVID negative x3 in last two weeks, and last test was on last Thursday. Patient admits to nausea, but denies abdominal pain, vomiting, and diarrhea. He lost all his appetite, and has had no BM for last 3-4 days.  Patient denies urinary symptoms and back pain. Patient denies chest pain and lower extremity edema. Patient continuously notes that he is just dehydrated and needs fluids.  ED Course: Rapid COVID test positive.  Review of Systems: As per HPI otherwise 10 point review of systems negative.    Past Medical History:  Diagnosis Date   Diabetes mellitus without complication (HCC)    GERD (gastroesophageal reflux disease)    HLD (hyperlipidemia)    Hypertension     Past Surgical History:  Procedure Laterality Date   CHOLECYSTECTOMY       reports that he has never smoked. He has never used smokeless tobacco. He reports current alcohol use of about 2.0 standard drinks of alcohol per week. He reports that he does not use drugs.  No Known Allergies  Family History  Problem Relation Age of Onset   Valvular heart disease Mother    Heart attack Father        42's     Prior to Admission medications   Medication Sig Start Date  End Date Taking? Authorizing Provider  aspirin 81 MG chewable tablet Chew 1 tablet (81 mg total) by mouth daily. 03/07/17   Santos-Sanchez, Merlene Morse, MD  glipiZIDE (GLUCOTROL XL) 10 MG 24 hr tablet Take 1 tablet by mouth daily. 03/10/17   [provider]  lisinopril-hydrochlorothiazide (PRINZIDE,ZESTORETIC) 20-25 MG per tablet Take 1 tablet by mouth daily.    [provider]  metFORMIN (GLUCOPHAGE) 1000 MG tablet Take 1 tablet by mouth 2 (two) times daily. 02/26/17   [provider]  ranitidine (ZANTAC) 150 MG tablet Take 150 mg by mouth daily.    [provider]  traMADol (ULTRAM) 50 MG tablet Take 1 tablet (50 mg total) by mouth every 6 (six) hours as needed for moderate pain. 08/12/17   Domenic Moras, PA-C    Physical Exam: Vitals:   04/29/19 1029 04/29/19 1315 04/29/19 1400 04/29/19 1415  BP: 114/90  129/84 (!) 125/92  Pulse: (!) 122 (!) 101 (!) 105 (!) 107  Resp: (!) 24 (!) 21    Temp: 98 F (36.7 C)     TempSrc: Oral     SpO2: 98% 95% 97% 98%  Weight: 96.2 kg     Height: 6\' 2"  (1.88 m)       Constitutional: NAD, calm, comfortable Vitals:   04/29/19 1029 04/29/19 1315 04/29/19 1400 04/29/19 1415  BP: 114/90  129/84 (!) 125/92  Pulse: (!) 122 (!) 101 (!) 105 (!) 107  Resp: (!) 24 (!) 21  Temp: 98 F (36.7 C)     TempSrc: Oral     SpO2: 98% 95% 97% 98%  Weight: 96.2 kg     Height: 6\' 2"  (1.88 m)      Eyes: PERRL, lids and conjunctivae normal ENMT: Mucous membranes are dry. Posterior pharynx clear of any exudate or lesions.Normal dentition.  Neck: normal, supple, no masses, no thyromegaly Respiratory: clear to auscultation bilaterally, no wheezing, no crackles. Normal respiratory effort. No accessory muscle use.  Cardiovascular: Regular rate and rhythm, no murmurs / rubs / gallops. No extremity edema. 2+ pedal pulses. No carotid bruits.  Abdomen: no tenderness, no masses palpated. No hepatosplenomegaly. Bowel sounds positive.    Musculoskeletal: no clubbing / cyanosis. No joint deformity upper and lower extremities. Good ROM, no contractures. Normal muscle tone.  Skin: no rashes, lesions, ulcers. No induration Neurologic: CN 2-12 grossly intact. Sensation intact, DTR normal. Strength 5/5 in all 4.  Psychiatric: Normal judgment and insight. Alert and oriented x 3. Normal mood.     Labs on Admission: I have personally reviewed following labs and imaging studies  CBC: Recent Labs  Lab 04/29/19 1032  WBC 6.2  HGB 13.3  HCT 39.1  MCV 91.1  PLT [id number]   Basic Metabolic Panel: Recent Labs  Lab 04/29/19 1032  NA 130*  K 4.5  CL 96*  CO2 20*  GLUCOSE 315*  BUN 25*  CREATININE 1.60*  CALCIUM 8.3*   GFR: Estimated Creatinine Clearance: 44.2 mL/min (A) (by C-G formula based on SCr of 1.6 mg/dL (H)). Liver Function Tests: Recent Labs  Lab 04/29/19 1439  AST 30  ALT 25  ALKPHOS 52  BILITOT 0.9  PROT 7.9  ALBUMIN 3.6   No results for input(s): LIPASE, AMYLASE in the last 168 hours. No results for input(s): AMMONIA in the last 168 hours. Coagulation Profile: No results for input(s): INR, PROTIME in the last 168 hours. Cardiac Enzymes: No results for input(s): CKTOTAL, CKMB, CKMBINDEX, TROPONINI in the last 168 hours. BNP (last 3 results) No results for input(s): PROBNP in the last 8760 hours. HbA1C: No results for input(s): HGBA1C in the last 72 hours. CBG: Recent Labs  Lab 04/29/19 1217 04/29/19 1658  GLUCAP 285* 248*   Lipid Profile: Recent Labs    04/29/19 1439  TRIG 93   Thyroid Function Tests: No results for input(s): TSH, T4TOTAL, FREET4, T3FREE, THYROIDAB in the last 72 hours. Anemia Panel: Recent Labs    04/29/19 1439  FERRITIN 354*   Urine analysis:    Component Value Date/Time   COLORURINE YELLOW 08/09/2013 1150   APPEARANCEUR CLEAR 08/09/2013 1150   LABSPEC 1.014 08/09/2013 1150   PHURINE 6.0 08/09/2013 1150   GLUCOSEU 250 (A) 08/09/2013 1150   HGBUR NEGATIVE  08/09/2013 1150   BILIRUBINUR NEGATIVE 08/09/2013 1150   KETONESUR NEGATIVE 08/09/2013 1150   PROTEINUR NEGATIVE 08/09/2013 1150   UROBILINOGEN 0.2 08/09/2013 1150   NITRITE NEGATIVE 08/09/2013 [place] 08/09/2013 1150    Radiological Exams on Admission: DG Chest Port 1 View  Result Date: 04/29/2019 CLINICAL DATA:  Fever EXAM: PORTABLE CHEST 1 VIEW COMPARISON:  Portable exam 1320 hours compared to 04/19/2017 FINDINGS: Normal heart size, mediastinal contours, and pulmonary vascularity. Minimal LEFT basilar atelectasis. Lungs otherwise clear. No acute infiltrate, pleural effusion or pneumothorax. Scattered endplate spur formation thoracic spine. IMPRESSION: Minimal LEFT basilar atelectasis. Electronically Signed   By: Lavonia Dana M.D.   On: 04/29/2019 13:28    EKG: Independently reviewed.  Assessment/Plan Active Problems:   COVID-19 virus infection   COVID-19   Acute hypoxic respiratory failure, secondary to COVID-19 infection. COVID-19 antigen was tested 3 times last 2 weeks negative, today in the ED rapid test was positive, will send a RT-PCR confirmation test. Start remdesivir and steroid.  AKI from dehydration/oral intake, slow hydration for today and watch breathing status.  Recheck BMP in the morning.  Hyponatremia, with hypovolemia from dehydration, as above.  IDDM, hold p.o. diabetic medications for AKI, start sliding scale.  HTN, blood pressure borderline, hold BP meds for today.  As needed labetalol.    DVT prophylaxis: Heparin subcu Code Status: Full code code Family Communication: Patient's wife over the phone Disposition Plan: Likely home Consults called: None Admission status: Telemetry admission   Lequita Halt MD [facility] Pager [phone]  If 7PM-7AM, please contact night-coverage [email or web address] Password [facility]  04/29/2019, 5:31 PM

## 2019-04-29 NOTE — ED Triage Notes (Addendum)
Pt arrives via POV from [place] sent for further eval of SOB with exertion, fever to 100.6, dehydration, loss of taste/smell. Pt reports 2 negative COVID tests. NAD at present. Pt has xrays from today's visit at [facility] that showed pneumonia.

## 2019-04-29 NOTE — ED Notes (Signed)
Luke Edwards [phone]) called/would like to be called for updates/Waiting outside of ED. Thank you

## 2019-04-30 LAB — CBC WITH DIFFERENTIAL/PLATELET
Abs Immature Granulocytes: 0.04 10*3/uL (ref 0.00–0.07)
Basophils Absolute: 0 10*3/uL (ref 0.0–0.1)
Basophils Relative: 0 %
Eosinophils Absolute: 0 10*3/uL (ref 0.0–0.5)
Eosinophils Relative: 0 %
HCT: 37.3 % — ABNORMAL LOW (ref 39.0–52.0)
Hemoglobin: 12.9 g/dL — ABNORMAL LOW (ref 13.0–17.0)
Immature Granulocytes: 1 %
Lymphocytes Relative: 15 %
Lymphs Abs: 1 10*3/uL (ref 0.7–4.0)
MCH: 31 pg (ref 26.0–34.0)
MCHC: 34.6 g/dL (ref 30.0–36.0)
MCV: 89.7 fL (ref 80.0–100.0)
Monocytes Absolute: 0.3 10*3/uL (ref 0.1–1.0)
Monocytes Relative: 4 %
Neutro Abs: 5.3 10*3/uL (ref 1.7–7.7)
Neutrophils Relative %: 80 %
Platelets: 268 10*3/uL (ref 150–400)
RBC: 4.16 MIL/uL — ABNORMAL LOW (ref 4.22–5.81)
RDW: 12.7 % (ref 11.5–15.5)
WBC: 6.6 10*3/uL (ref 4.0–10.5)
nRBC: 0 % (ref 0.0–0.2)

## 2019-04-30 LAB — COMPREHENSIVE METABOLIC PANEL
ALT: 27 U/L (ref 0–44)
AST: 29 U/L (ref 15–41)
Albumin: 3 g/dL — ABNORMAL LOW (ref 3.5–5.0)
Alkaline Phosphatase: 48 U/L (ref 38–126)
Anion gap: 14 (ref 5–15)
BUN: 30 mg/dL — ABNORMAL HIGH (ref 8–23)
CO2: 19 mmol/L — ABNORMAL LOW (ref 22–32)
Calcium: 8.2 mg/dL — ABNORMAL LOW (ref 8.9–10.3)
Chloride: 99 mmol/L (ref 98–111)
Creatinine, Ser: 1.61 mg/dL — ABNORMAL HIGH (ref 0.61–1.24)
GFR calc Af Amer: 47 mL/min — ABNORMAL LOW (ref >60)
GFR calc non Af Amer: 40 mL/min — ABNORMAL LOW (ref >60)
Glucose, Bld: 359 mg/dL — ABNORMAL HIGH (ref 70–99)
Potassium: 3.9 mmol/L (ref 3.5–5.1)
Sodium: 132 mmol/L — ABNORMAL LOW (ref 135–145)
Total Bilirubin: 0.7 mg/dL (ref 0.3–1.2)
Total Protein: 7.2 g/dL (ref 6.5–8.1)

## 2019-04-30 LAB — D-DIMER, QUANTITATIVE: D-Dimer, Quant: 0.66 ug/mL-FEU — ABNORMAL HIGH (ref 0.00–0.50)

## 2019-04-30 LAB — GLUCOSE, CAPILLARY
Glucose-Capillary: 340 mg/dL — ABNORMAL HIGH (ref 70–99)
Glucose-Capillary: 336 mg/dL — ABNORMAL HIGH (ref 70–99)
Glucose-Capillary: 260 mg/dL — ABNORMAL HIGH (ref 70–99)
Glucose-Capillary: 313 mg/dL — ABNORMAL HIGH (ref 70–99)

## 2019-04-30 LAB — MAGNESIUM: Magnesium: 1.8 mg/dL (ref 1.7–2.4)

## 2019-04-30 LAB — C-REACTIVE PROTEIN: CRP: 14.3 mg/dL — ABNORMAL HIGH (ref <1.0)

## 2019-04-30 LAB — PHOSPHORUS: Phosphorus: 4.5 mg/dL (ref 2.5–4.6)

## 2019-04-30 LAB — FERRITIN: Ferritin: 368 ng/mL — ABNORMAL HIGH (ref 24–336)

## 2019-04-30 MED ORDER — SODIUM CHLORIDE 0.9 % IV SOLN: INTRAVENOUS | Status: DC

## 2019-04-30 MED ORDER — INSULIN DETEMIR 100 UNIT/ML ~~LOC~~ SOLN
10.0000 [IU] | Freq: Two times a day (BID) | SUBCUTANEOUS | Status: DC
  Administered 2019-04-30 (×2): 10 [IU] via SUBCUTANEOUS
  Filled 2019-04-30 (×4): qty 0.1

## 2019-04-30 MED ORDER — TAMSULOSIN HCL 0.4 MG PO CAPS
0.4000 mg | ORAL_CAPSULE | Freq: Two times a day (BID) | ORAL | Status: DC
  Administered 2019-04-30 – 2019-05-03 (×7): 0.4 mg via ORAL
  Filled 2019-04-30 (×7): qty 1

## 2019-04-30 MED ORDER — LINAGLIPTIN 5 MG PO TABS
5.0000 mg | ORAL_TABLET | Freq: Every day | ORAL | Status: DC
  Administered 2019-04-30 – 2019-05-03 (×4): 5 mg via ORAL
  Filled 2019-04-30 (×4): qty 1

## 2019-04-30 MED ORDER — FINASTERIDE 5 MG PO TABS
5.0000 mg | ORAL_TABLET | Freq: Every day | ORAL | Status: DC
  Administered 2019-04-30 – 2019-05-03 (×4): 5 mg via ORAL
  Filled 2019-04-30 (×4): qty 1

## 2019-04-30 NOTE — Progress Notes (Signed)
PROGRESS NOTE    Luke Edwards  [id number] DOB: Mar 20, 1941 DOA: 04/29/2019 PCP: Bonnita Nasuti, MD   Brief Narrative:  Luke Edwards is a 79 y.o. male with medical history significant of diabetes, GERD, hyperlipidemia, and hypertension who presents to the ED due togradual onset of worseningshortness of breath, fever, loss of taste and smell x2 weeks with intermittent shortness of breath on exertion, and occasional dry cough or chest pains. He also admits to intermittent fevers x 2 week. T. Max 100.6 yesterday. Patient has been tested COVID negative x3 in last two weeks, and last test was on last Thursday. Rapid Covid test came back positive.  Subjective: Was feeling better when seen this morning.  Denies any new complaints.  Assessment & Plan:   Active Problems:   COVID-19 virus infection   Acute respiratory distress syndrome (ARDS) due to COVID-19 virus (HCC)  Acute hypoxic respiratory failure, secondary to COVID-19 infection. COVID-19 Labs  Recent Labs    04/29/19 1439 04/30/19 0628  DDIMER 0.80* 0.66*  FERRITIN 354* 368*  LDH 241*  --   CRP 13.3* 14.3*    No results found for: SARSCOV2NAA   SpO2: 93 % O2 Flow Rate (L/min): 3 L/min   Inflammatory markers are elevated.  Clinically feeling better. Blood cultures remain negative.  PCT normal. -Continue remdesivir and Decadron. -Can consider Actemra if oxygen requirement increases as CRP rising. -Continue to monitor inflammatory markers.  AKI.  Likely secondary to dehydration and poor p.o. intake. Creatinine was between 1.17-1.22 in 2018. -Creatinine remained stable today at 1.61. -Gently hydrate with normal saline at 75 mL/h for 12 hours. -Monitor renal functions. -Avoid nephrotoxic.  Type 2 diabetes.  Controlled with A1c of 9.0 CBG elevated-patient is on steroid -Start him on Levemir 10 units twice daily. -Tradjenta 5 mg daily. -Continue with sliding scale.  Hyponatremia.  Mild hyponatremia with  sodium of 130 on presentation, most likely secondary to poor p.o. intake and dehydration.  Improved to 132 today. -Gentle hydration and monitor.  Hypertension.  Holding home dose of lisinopril-HCTZ due to AKI. -Labetalol as needed for systolic above 0000000.  BPH.  -Continue home dose of Flomax and finasteride.  Objective: Vitals:   04/29/19 2100 04/29/19 2200 04/30/19 0011 04/30/19 0425  BP: 129/80  135/84 133/88  Pulse: (!) 102  97   Resp: 17  20   Temp: 100.3 F (37.9 C) 98.5 F (36.9 C) 97.8 F (36.6 C) 97.7 F (36.5 C)  TempSrc: Oral Oral Oral Oral  SpO2: 95%  95% 93%  Weight:      Height:        Intake/Output Summary (Last 24 hours) at 04/30/2019 1700 Last data filed at 04/30/2019 1500 Gross per 24 hour  Intake 1485.92 ml  Output 1000 ml  Net 485.92 ml   Filed Weights   04/29/19 1029 04/29/19 1751  Weight: 96.2 kg 99.9 kg    Examination:  General exam: Well-developed elderly man, appears calm and comfortable  Respiratory system: Clear to auscultation. Respiratory effort normal. Cardiovascular system: S1 & S2 heard, RRR. No JVD, murmurs, rubs, gallops or clicks. Gastrointestinal system: Soft, nontender, nondistended, bowel sounds positive. Central nervous system: Alert and oriented. No focal neurological deficits.Symmetric 5 x 5 power. Extremities: No edema, no cyanosis, pulses intact and symmetrical. Skin: No rashes, lesions or ulcers Psychiatry: Judgement and insight appear normal. Mood & affect appropriate.   DVT prophylaxis: Heparin Code Status: Full Family Communication: No family at bedside Disposition Plan: Pending improvement and completion of  remdesivir.  Consultants:   None  Procedures:  Antimicrobials:   Data Reviewed: I have personally reviewed following labs and imaging studies  CBC: Recent Labs  Lab 04/29/19 1032 04/30/19 0628  WBC 6.2 6.6  NEUTROABS  --  5.3  HGB 13.3 12.9*  HCT 39.1 37.3*  MCV 91.1 89.7  PLT 245 [id number]   Basic  Metabolic Panel: Recent Labs  Lab 04/29/19 1032 04/30/19 0628  NA 130* 132*  K 4.5 3.9  CL 96* 99  CO2 20* 19*  GLUCOSE 315* 359*  BUN 25* 30*  CREATININE 1.60* 1.61*  CALCIUM 8.3* 8.2*  MG  --  1.8  PHOS  --  4.5   GFR: Estimated Creatinine Clearance: 47.8 mL/min (A) (by C-G formula based on SCr of 1.61 mg/dL (H)). Liver Function Tests: Recent Labs  Lab 04/29/19 1439 04/30/19 0628  AST 30 29  ALT 25 27  ALKPHOS 52 48  BILITOT 0.9 0.7  PROT 7.9 7.2  ALBUMIN 3.6 3.0*   No results for input(s): LIPASE, AMYLASE in the last 168 hours. No results for input(s): AMMONIA in the last 168 hours. Coagulation Profile: No results for input(s): INR, PROTIME in the last 168 hours. Cardiac Enzymes: No results for input(s): CKTOTAL, CKMB, CKMBINDEX, TROPONINI in the last 168 hours. BNP (last 3 results) No results for input(s): PROBNP in the last 8760 hours. HbA1C: Recent Labs    04/29/19 1812  HGBA1C 9.0*   CBG: Recent Labs  Lab 04/29/19 1217 04/29/19 1658 04/29/19 2102 04/30/19 0801 04/30/19 1229  GLUCAP 285* 248* 262* 340* 336*   Lipid Profile: Recent Labs    04/29/19 1439  TRIG 93   Thyroid Function Tests: No results for input(s): TSH, T4TOTAL, FREET4, T3FREE, THYROIDAB in the last 72 hours. Anemia Panel: Recent Labs    04/29/19 1439 04/30/19 0628  FERRITIN 354* 368*   Sepsis Labs: Recent Labs  Lab 04/29/19 1439 04/29/19 1812  PROCALCITON <0.10  --   LATICACIDVEN 1.9 1.4    Recent Results (from the past 240 hour(s))  Blood Culture (routine x 2)     Status: None (Preliminary result)   Collection Time: 04/29/19  3:04 PM   Specimen: BLOOD  Result Value Ref Range Status   Specimen Description BLOOD LEFT ANTECUBITAL  Final   Special Requests   Final    BOTTLES DRAWN AEROBIC AND ANAEROBIC Blood Culture adequate volume   Culture   Final    NO GROWTH < 24 HOURS Performed at [facility], [place] [address] [place], [place] [address]    Report  Status PENDING  Incomplete  Blood Culture (routine x 2)     Status: None (Preliminary result)   Collection Time: 04/29/19  4:06 PM   Specimen: BLOOD  Result Value Ref Range Status   Specimen Description BLOOD RIGHT ANTECUBITAL  Final   Special Requests   Final    BOTTLES DRAWN AEROBIC AND ANAEROBIC Blood Culture adequate volume   Culture   Final    NO GROWTH < 24 HOURS Performed at [facility], [place] [address] [place], [place] [address]    Report Status PENDING  Incomplete     Radiology Studies: DG Chest Port 1 View  Result Date: 04/29/2019 CLINICAL DATA:  Fever EXAM: PORTABLE CHEST 1 VIEW COMPARISON:  Portable exam 1320 hours compared to 04/19/2017 FINDINGS: Normal heart size, mediastinal contours, and pulmonary vascularity. Minimal LEFT basilar atelectasis. Lungs otherwise clear. No acute infiltrate, pleural effusion or pneumothorax. Scattered endplate  spur formation thoracic spine. IMPRESSION: Minimal LEFT basilar atelectasis. Electronically Signed   By: Lavonia Dana M.D.   On: 04/29/2019 13:28    Scheduled Meds: . albuterol  2 puff Inhalation BID  . aspirin  81 mg Oral Daily  . dexamethasone  6 mg Oral Q24H  . famotidine  20 mg Oral Daily  . finasteride  5 mg Oral Daily  . heparin  5,000 Units Subcutaneous Q8H  . insulin aspart  0-15 Units Subcutaneous TID WC  . insulin detemir  10 Units Subcutaneous BID  . linagliptin  5 mg Oral Daily  . tamsulosin  0.4 mg Oral BID   Continuous Infusions: . remdesivir 100 mg in NS 100 mL 100 mg (04/30/19 0827)     LOS: 1 day   Time spent: 40 minutes.  Lorella Nimrod, MD [facility] Pager [phone]  If 7PM-7AM, please contact night-coverage [email or web address] Password [facility] 04/30/2019, 5:00 PM   This record has been created using Systems analyst. Errors have been sought and corrected,but may not always be located. Such creation errors do not reflect on the standard of care.

## 2019-04-30 NOTE — Progress Notes (Signed)
Inpatient Diabetes Program Recommendations  AACE/ADA: New Consensus Statement on Inpatient Glycemic Control (2015)  Target Ranges:  Prepandial:   less than 140 mg/dL      Peak postprandial:   less than 180 mg/dL (1-2 hours)      Critically ill patients:  140 - 180 mg/dL   Lab Results  Component Value Date   GLUCAP 340 (H) 04/30/2019   HGBA1C 9.0 (H) 04/29/2019    Review of Glycemic Control Results for Luke Edwards, Luke Edwards (MRN [id number]) as of 04/30/2019 12:02  Ref. Range 04/29/2019 12:17 04/29/2019 16:58 04/29/2019 21:02 04/30/2019 08:01  Glucose-Capillary Latest Ref Range: 70 - 99 mg/dL 285 (H) 248 (H) 262 (H) 340 (H)   Diabetes history: Type 2 Dm Outpatient Diabetes medications: Glipizide 10 mg QD, Metformin 1000 mg BID Current orders for Inpatient glycemic control: Novolog 0-15 units TID Decadron 6 mg QD  Inpatient Diabetes Program Recommendations:    Consider starting Levemir 10 units BID and adding Tradjenta 5 mg QD (DPP4 use in this population has been shown to decrease risk of mortality in Type 2 DM).  Thanks, Bronson Curb, MSN, RNC-OB Diabetes Coordinator [phone] (8a-5p)

## 2019-05-01 ENCOUNTER — Other Ambulatory Visit: Payer: Self-pay

## 2019-05-01 ENCOUNTER — Encounter (HOSPITAL_COMMUNITY): Payer: Self-pay | Admitting: Internal Medicine

## 2019-05-01 DIAGNOSIS — J9601 Acute respiratory failure with hypoxia: Secondary | ICD-10-CM

## 2019-05-01 DIAGNOSIS — I10 Essential (primary) hypertension: Secondary | ICD-10-CM

## 2019-05-01 DIAGNOSIS — E785 Hyperlipidemia, unspecified: Secondary | ICD-10-CM

## 2019-05-01 HISTORY — DX: Acute respiratory failure with hypoxia: J96.01

## 2019-05-01 HISTORY — DX: Essential (primary) hypertension: I10

## 2019-05-01 LAB — CBC WITH DIFFERENTIAL/PLATELET
Abs Immature Granulocytes: 0.08 10*3/uL — ABNORMAL HIGH (ref 0.00–0.07)
Basophils Absolute: 0 10*3/uL (ref 0.0–0.1)
Basophils Relative: 0 %
Eosinophils Absolute: 0 10*3/uL (ref 0.0–0.5)
Eosinophils Relative: 0 %
HCT: 36 % — ABNORMAL LOW (ref 39.0–52.0)
Hemoglobin: 12.2 g/dL — ABNORMAL LOW (ref 13.0–17.0)
Immature Granulocytes: 1 %
Lymphocytes Relative: 8 %
Lymphs Abs: 0.9 10*3/uL (ref 0.7–4.0)
MCH: 30.9 pg (ref 26.0–34.0)
MCHC: 33.9 g/dL (ref 30.0–36.0)
MCV: 91.1 fL (ref 80.0–100.0)
Monocytes Absolute: 0.6 10*3/uL (ref 0.1–1.0)
Monocytes Relative: 5 %
Neutro Abs: 9.6 10*3/uL — ABNORMAL HIGH (ref 1.7–7.7)
Neutrophils Relative %: 86 %
Platelets: 304 10*3/uL (ref 150–400)
RBC: 3.95 MIL/uL — ABNORMAL LOW (ref 4.22–5.81)
RDW: 12.7 % (ref 11.5–15.5)
WBC: 11.1 10*3/uL — ABNORMAL HIGH (ref 4.0–10.5)
nRBC: 0 % (ref 0.0–0.2)

## 2019-05-01 LAB — D-DIMER, QUANTITATIVE: D-Dimer, Quant: 0.72 ug/mL-FEU — ABNORMAL HIGH (ref 0.00–0.50)

## 2019-05-01 LAB — COMPREHENSIVE METABOLIC PANEL
ALT: 25 U/L (ref 0–44)
AST: 25 U/L (ref 15–41)
Albumin: 2.7 g/dL — ABNORMAL LOW (ref 3.5–5.0)
Alkaline Phosphatase: 49 U/L (ref 38–126)
Anion gap: 10 (ref 5–15)
BUN: 39 mg/dL — ABNORMAL HIGH (ref 8–23)
CO2: 18 mmol/L — ABNORMAL LOW (ref 22–32)
Calcium: 8.1 mg/dL — ABNORMAL LOW (ref 8.9–10.3)
Chloride: 102 mmol/L (ref 98–111)
Creatinine, Ser: 1.36 mg/dL — ABNORMAL HIGH (ref 0.61–1.24)
GFR calc Af Amer: 57 mL/min — ABNORMAL LOW (ref >60)
GFR calc non Af Amer: 49 mL/min — ABNORMAL LOW (ref >60)
Glucose, Bld: 393 mg/dL — ABNORMAL HIGH (ref 70–99)
Potassium: 4.8 mmol/L (ref 3.5–5.1)
Sodium: 130 mmol/L — ABNORMAL LOW (ref 135–145)
Total Bilirubin: 0.6 mg/dL (ref 0.3–1.2)
Total Protein: 6.4 g/dL — ABNORMAL LOW (ref 6.5–8.1)

## 2019-05-01 LAB — C-REACTIVE PROTEIN: CRP: 10 mg/dL — ABNORMAL HIGH (ref <1.0)

## 2019-05-01 LAB — GLUCOSE, CAPILLARY
Glucose-Capillary: 356 mg/dL — ABNORMAL HIGH (ref 70–99)
Glucose-Capillary: 371 mg/dL — ABNORMAL HIGH (ref 70–99)
Glucose-Capillary: 350 mg/dL — ABNORMAL HIGH (ref 70–99)
Glucose-Capillary: 261 mg/dL — ABNORMAL HIGH (ref 70–99)

## 2019-05-01 LAB — FERRITIN: Ferritin: 377 ng/mL — ABNORMAL HIGH (ref 24–336)

## 2019-05-01 LAB — MAGNESIUM: Magnesium: 1.9 mg/dL (ref 1.7–2.4)

## 2019-05-01 LAB — BRAIN NATRIURETIC PEPTIDE: B Natriuretic Peptide: 25.9 pg/mL (ref 0.0–100.0)

## 2019-05-01 LAB — PHOSPHORUS: Phosphorus: 3.1 mg/dL (ref 2.5–4.6)

## 2019-05-01 MED ORDER — IPRATROPIUM-ALBUTEROL 20-100 MCG/ACT IN AERS
1.0000 | INHALATION_SPRAY | Freq: Four times a day (QID) | RESPIRATORY_TRACT | Status: DC
  Administered 2019-05-01 – 2019-05-03 (×7): 1 via RESPIRATORY_TRACT
  Filled 2019-05-01 (×2): qty 4

## 2019-05-01 MED ORDER — SENNOSIDES-DOCUSATE SODIUM 8.6-50 MG PO TABS
2.0000 | ORAL_TABLET | Freq: Every evening | ORAL | Status: DC | PRN
  Administered 2019-05-02: 2 via ORAL
  Filled 2019-05-01: qty 2

## 2019-05-01 MED ORDER — ACETAMINOPHEN 325 MG PO TABS: 650.0000 mg | ORAL_TABLET | Freq: Four times a day (QID) | ORAL | Status: DC | PRN

## 2019-05-01 MED ORDER — ZINC SULFATE 220 (50 ZN) MG PO CAPS
220.0000 mg | ORAL_CAPSULE | Freq: Every day | ORAL | Status: DC
  Administered 2019-05-01 – 2019-05-03 (×3): 220 mg via ORAL
  Filled 2019-05-01 (×3): qty 1

## 2019-05-01 MED ORDER — ENOXAPARIN SODIUM 40 MG/0.4ML ~~LOC~~ SOLN
40.0000 mg | Freq: Every day | SUBCUTANEOUS | Status: DC
  Administered 2019-05-01 – 2019-05-03 (×3): 40 mg via SUBCUTANEOUS
  Filled 2019-05-01 (×3): qty 0.4

## 2019-05-01 MED ORDER — METHYLPREDNISOLONE SODIUM SUCC 40 MG IJ SOLR
40.0000 mg | Freq: Three times a day (TID) | INTRAMUSCULAR | Status: DC
  Administered 2019-05-01 – 2019-05-02 (×4): 40 mg via INTRAVENOUS
  Filled 2019-05-01 (×4): qty 1

## 2019-05-01 MED ORDER — ASCORBIC ACID 500 MG PO TABS
500.0000 mg | ORAL_TABLET | Freq: Every day | ORAL | Status: DC
  Administered 2019-05-01 – 2019-05-03 (×3): 500 mg via ORAL
  Filled 2019-05-01 (×3): qty 1

## 2019-05-01 MED ORDER — INSULIN ASPART 100 UNIT/ML ~~LOC~~ SOLN
0.0000 [IU] | Freq: Three times a day (TID) | SUBCUTANEOUS | Status: DC
  Administered 2019-05-01 – 2019-05-02 (×2): 15 [IU] via SUBCUTANEOUS
  Administered 2019-05-02: 20 [IU] via SUBCUTANEOUS
  Administered 2019-05-02: 15 [IU] via SUBCUTANEOUS
  Administered 2019-05-03: 11 [IU] via SUBCUTANEOUS

## 2019-05-01 MED ORDER — INSULIN ASPART 100 UNIT/ML ~~LOC~~ SOLN
0.0000 [IU] | Freq: Every day | SUBCUTANEOUS | Status: DC
  Administered 2019-05-01: 3 [IU] via SUBCUTANEOUS
  Administered 2019-05-02: 4 [IU] via SUBCUTANEOUS

## 2019-05-01 MED ORDER — INSULIN ASPART 100 UNIT/ML ~~LOC~~ SOLN
4.0000 [IU] | Freq: Three times a day (TID) | SUBCUTANEOUS | Status: DC
  Administered 2019-05-01 (×3): 4 [IU] via SUBCUTANEOUS

## 2019-05-01 MED ORDER — POLYETHYLENE GLYCOL 3350 17 G PO PACK: 17.0000 g | PACK | Freq: Every day | ORAL | Status: DC | PRN

## 2019-05-01 MED ORDER — INSULIN DETEMIR 100 UNIT/ML ~~LOC~~ SOLN
15.0000 [IU] | Freq: Two times a day (BID) | SUBCUTANEOUS | Status: DC
  Administered 2019-05-01 (×2): 15 [IU] via SUBCUTANEOUS
  Filled 2019-05-01 (×5): qty 0.15

## 2019-05-01 NOTE — Progress Notes (Signed)
Results for ZAE, STONES (MRN [id number]) as of 05/01/2019 13:54  Ref. Range 04/30/2019 12:29 04/30/2019 17:06 04/30/2019 20:47 05/01/2019 08:03 05/01/2019 12:22  Glucose-Capillary Latest Ref Range: 70 - 99 mg/dL 336 (H) 260 (H) 313 (H) 356 (H) 371 (H)  Noted that blood sugars continue to be greater than 250 mg/dl.   Recommend increasing Novolog correction scale to RESISTANT TID and add HS correction scale if blood sugars continue to be elevated.   Harvel Ricks RN BSN CDE Diabetes Coordinator Pager: [phone]  8am-5pm

## 2019-05-01 NOTE — Progress Notes (Signed)
PROGRESS NOTE    Danyael Yi  [id number] DOB: 13-Apr-1940 DOA: 04/29/2019 PCP: Bonnita Nasuti, MD   Brief Narrative:  79 y.o.malewith medical history significant ofdiabetes, GERD, hyperlipidemia, and hypertension who presents to the ED due togradual onset of worseningshortness of breath, fever, loss of taste and smell x2 weekswithintermittent shortness of breathonexertion, and occasional dry cough or chest pains. He also admits to intermittent fevers x 2 week. T. Max 100.6 yesterday. Patient has been tested COVIDnegative x3 in last two weeks, and last test was onlast Thursday. Rapid Covid test came back positive.  During the hospitalization received IV fluids which resolved his acute kidney injury.  Was started on Solu-Medrol and remdesivir along with routine supportive care.   Assessment & Plan:   Principal Problem:   Acute respiratory failure with hypoxia (HCC) Active Problems:   COVID-19 virus infection   Acute respiratory distress syndrome (ARDS) due to COVID-19 virus (HCC)   HTN (hypertension)   HLD (hyperlipidemia)  Acute hypoxic respiratory failure secondary to COVID-19 pneumonia -Oxygen levels-3 L nasal cannula -Remdesivir-day 3 -Solu-Medrol-day 3 -Routine: Labs have been reviewed including ferritin, LDH, CRP, d-dimer, fibrinogen.  Will need to trend this lab daily. -Vitamin C & Zinc. Prone >16hrs/day.  -procalcitonin-negative, BNP 25 -Chest x-ray-minimal left basilar atelectasis -Supportive care-antitussive, inhalers, I-S/flutter -CODE STATUS confirmed  Acute kidney injury -Suspect prerenal in the setting of dehydration and poor oral intake  Diabetes mellitus type 2, uncontrolled steroid-induced hyperglycemia -A1c 9.0.  Glipizide and Metformin on hold  Essential hypertension -Labetalol as needed.  Lisinopril and HCTZ on hold due to AKI  BPH -Continue Flomax and finasteride    DVT prophylaxis: Lovenox Code Status: Full code Family  Communication: None at bedside Disposition Plan: Maintain hospital stay until his hypoxia has resolved.  In the meantime complete treatment for remdesivir and steroids  Consultants:   None  Procedures:   None  Antimicrobials:   None   Subjective: Feels slightly better than yesterday but still having hypoxia at rest and with minimal ambulation.  Currently on 3 L nasal cannula.  Review of Systems Otherwise negative except as per HPI, including: General: Denies fever, chills, night sweats or unintended weight loss. Resp: Denies cough, wheezing,  Cardiac: Denies chest pain, palpitations, orthopnea, paroxysmal nocturnal dyspnea. GI: Denies abdominal pain, nausea, vomiting, diarrhea or constipation GU: Denies dysuria, frequency, hesitancy or incontinence MS: Denies muscle aches, joint pain or swelling Neuro: Denies headache, neurologic deficits (focal weakness, numbness, tingling), abnormal gait Psych: Denies anxiety, depression, SI/HI/AVH Skin: Denies new rashes or lesions ID: Denies sick contacts, exotic exposures, travel  Objective: Vitals:   04/30/19 1927 04/30/19 2100 05/01/19 0435 05/01/19 0851  BP:   (!) 149/79   Pulse:  89  98  Resp:  19  (!) 21  Temp: 98.3 F (36.8 C)  98.3 F (36.8 C)   TempSrc:      SpO2:  93%  95%  Weight:      Height:        Intake/Output Summary (Last 24 hours) at 05/01/2019 1204 Last data filed at 05/01/2019 0300 Gross per 24 hour  Intake 1154.84 ml  Output 1550 ml  Net -395.16 ml   Filed Weights   04/29/19 1029 04/29/19 1751  Weight: 96.2 kg 99.9 kg    Examination:  General exam: Appears calm and comfortable, 3 L nasal cannula Respiratory system: Some bibasilar crackles Cardiovascular system: S1 & S2 heard, RRR. No JVD, murmurs, rubs, gallops or clicks. No pedal edema. Gastrointestinal system: Abdomen  is nondistended, soft and nontender. No organomegaly or masses felt. Normal bowel sounds heard. Central nervous system: Alert  and oriented. No focal neurological deficits. Extremities: Symmetric 5 x 5 power. Skin: No rashes, lesions or ulcers Psychiatry: Judgement and insight appear normal. Mood & affect appropriate.     Data Reviewed:   CBC: Recent Labs  Lab 04/29/19 1032 04/30/19 0628 05/01/19 0507  WBC 6.2 6.6 11.1*  NEUTROABS  --  5.3 9.6*  HGB 13.3 12.9* 12.2*  HCT 39.1 37.3* 36.0*  MCV 91.1 89.7 91.1  PLT 245 268 [id number]   Basic Metabolic Panel: Recent Labs  Lab 04/29/19 1032 04/30/19 0628 05/01/19 0507  NA 130* 132* 130*  K 4.5 3.9 4.8  CL 96* 99 102  CO2 20* 19* 18*  GLUCOSE 315* 359* 393*  BUN 25* 30* 39*  CREATININE 1.60* 1.61* 1.36*  CALCIUM 8.3* 8.2* 8.1*  MG  --  1.8 1.9  PHOS  --  4.5 3.1   GFR: Estimated Creatinine Clearance: 56.5 mL/min (A) (by C-G formula based on SCr of 1.36 mg/dL (H)). Liver Function Tests: Recent Labs  Lab 04/29/19 1439 04/30/19 0628 05/01/19 0507  AST 30 29 25   ALT 25 27 25   ALKPHOS 52 48 49  BILITOT 0.9 0.7 0.6  PROT 7.9 7.2 6.4*  ALBUMIN 3.6 3.0* 2.7*   No results for input(s): LIPASE, AMYLASE in the last 168 hours. No results for input(s): AMMONIA in the last 168 hours. Coagulation Profile: No results for input(s): INR, PROTIME in the last 168 hours. Cardiac Enzymes: No results for input(s): CKTOTAL, CKMB, CKMBINDEX, TROPONINI in the last 168 hours. BNP (last 3 results) No results for input(s): PROBNP in the last 8760 hours. HbA1C: Recent Labs    04/29/19 1812  HGBA1C 9.0*   CBG: Recent Labs  Lab 04/30/19 0801 04/30/19 1229 04/30/19 1706 04/30/19 2047 05/01/19 0803  GLUCAP 340* 336* 260* 313* 356*   Lipid Profile: Recent Labs    04/29/19 1439  TRIG 93   Thyroid Function Tests: No results for input(s): TSH, T4TOTAL, FREET4, T3FREE, THYROIDAB in the last 72 hours. Anemia Panel: Recent Labs    04/30/19 0628 05/01/19 0507  FERRITIN 368* 377*   Sepsis Labs: Recent Labs  Lab 04/29/19 1439 04/29/19 1812    PROCALCITON <0.10  --   LATICACIDVEN 1.9 1.4    Recent Results (from the past 240 hour(s))  Blood Culture (routine x 2)     Status: None (Preliminary result)   Collection Time: 04/29/19  3:04 PM   Specimen: BLOOD  Result Value Ref Range Status   Specimen Description BLOOD LEFT ANTECUBITAL  Final   Special Requests   Final    BOTTLES DRAWN AEROBIC AND ANAEROBIC Blood Culture adequate volume   Culture   Final    NO GROWTH 2 DAYS Performed at [facility], [place] [address], [place], [place] [address]    Report Status PENDING  Incomplete  Blood Culture (routine x 2)     Status: None (Preliminary result)   Collection Time: 04/29/19  4:06 PM   Specimen: BLOOD  Result Value Ref Range Status   Specimen Description BLOOD RIGHT ANTECUBITAL  Final   Special Requests   Final    BOTTLES DRAWN AEROBIC AND ANAEROBIC Blood Culture adequate volume   Culture   Final    NO GROWTH 2 DAYS Performed at [facility], [place] [address] [place], [place] [address]    Report Status PENDING  Incomplete  Radiology Studies: DG Chest Port 1 View  Result Date: 04/29/2019 CLINICAL DATA:  Fever EXAM: PORTABLE CHEST 1 VIEW COMPARISON:  Portable exam 1320 hours compared to 04/19/2017 FINDINGS: Normal heart size, mediastinal contours, and pulmonary vascularity. Minimal LEFT basilar atelectasis. Lungs otherwise clear. No acute infiltrate, pleural effusion or pneumothorax. Scattered endplate spur formation thoracic spine. IMPRESSION: Minimal LEFT basilar atelectasis. Electronically Signed   By: Lavonia Dana M.D.   On: 04/29/2019 13:28        Scheduled Meds: . vitamin C  500 mg Oral Daily  . aspirin  81 mg Oral Daily  . enoxaparin (LOVENOX) injection  40 mg Subcutaneous Daily  . famotidine  20 mg Oral Daily  . finasteride  5 mg Oral Daily  . insulin aspart  0-15 Units Subcutaneous TID WC  . insulin aspart  4 Units Subcutaneous TID WC  . insulin detemir  15 Units Subcutaneous BID  .  Ipratropium-Albuterol  1 puff Inhalation Q6H  . linagliptin  5 mg Oral Daily  . methylPREDNISolone (SOLU-MEDROL) injection  40 mg Intravenous Q8H  . tamsulosin  0.4 mg Oral BID  . zinc sulfate  220 mg Oral Daily   Continuous Infusions: . remdesivir 100 mg in NS 100 mL 100 mg (05/01/19 1042)     LOS: 2 days   Time spent= 35 mins    Aleyssa Pike Arsenio Loader, MD [facility]  If 7PM-7AM, please contact night-coverage  05/01/2019, 12:04 PM

## 2019-05-01 NOTE — TOC Initial Note (Addendum)
Transition of Care [facility]) - Initial/Assessment Note    Patient Details  Name: Luke Edwards MRN: [id number] Date of Birth: 08-06-1940  Transition of [facility]) CM/SW Contact:    Maryclare Labrador, RN Phone Number: 05/01/2019, 1:41 PM  Clinical Narrative:     PTA completely independent from home with wife.  Pt has PCP and denied barriers with paying for discharge medications.  CM reached out to Remote at home program to see if they could accept a pt outside of [facility] but less than 24/27 miles from [place] depending on route.  Program will accept pt if referral is approved by attending.  CM reached out to attending for program consideration  Expected Discharge Plan: Home/Self Care Barriers to Discharge: Continued Medical Work up   Patient Goals and CMS Choice        Expected Discharge Plan and Services Expected Discharge Plan: Home/Self Care       Living arrangements for the past 2 months: Single Family Home                                      Prior Living Arrangements/Services Living arrangements for the past 2 months: Single Family Home Lives with:: Spouse Patient language and need for interpreter reviewed:: Yes Do you feel safe going back to the place where you live?: Yes      Need for Family Participation in Patient Care: No (Comment) Care giver support system in place?: Yes (comment)   Criminal Activity/Legal Involvement Pertinent to Current Situation/Hospitalization: No - Comment as needed  Activities of Daily Living      Permission Sought/Granted                  Emotional Assessment   Attitude/Demeanor/Rapport: Gracious, Self-Confident, Engaged Affect (typically observed): Accepting Orientation: : Oriented to Self, Oriented to Place, Oriented to  Time, Oriented to Situation   Psych Involvement: No (comment)  Admission diagnosis:  Acute respiratory distress syndrome (ARDS) due to COVID-19 virus ([place]) [U07.1, J80] COVID-19  [U07.1] Patient Active Problem List   Diagnosis Date Noted  . HTN (hypertension) 05/01/2019  . HLD (hyperlipidemia) 05/01/2019  . Acute respiratory failure with hypoxia ([place]) 05/01/2019  . COVID-19 virus infection 04/29/2019  . Acute respiratory distress syndrome (ARDS) due to COVID-19 virus ([place]) 04/29/2019  . Chest pain 03/05/2017   PCP:  Bonnita Nasuti, MD Pharmacy:   [facility], [place] [address] Phone: [phone] Fax: [facility] [place], [place] [address] Phone: [phone] Fax: [phone]     Social Determinants of Health (SDOH) Interventions    Readmission Risk Interventions No flowsheet data found.

## 2019-05-02 LAB — CBC WITH DIFFERENTIAL/PLATELET
Abs Immature Granulocytes: 0.1 10*3/uL — ABNORMAL HIGH (ref 0.00–0.07)
Basophils Absolute: 0 10*3/uL (ref 0.0–0.1)
Basophils Relative: 0 %
Eosinophils Absolute: 0 10*3/uL (ref 0.0–0.5)
Eosinophils Relative: 0 %
HCT: 33.6 % — ABNORMAL LOW (ref 39.0–52.0)
Hemoglobin: 11.5 g/dL — ABNORMAL LOW (ref 13.0–17.0)
Immature Granulocytes: 1 %
Lymphocytes Relative: 6 %
Lymphs Abs: 0.8 10*3/uL (ref 0.7–4.0)
MCH: 31 pg (ref 26.0–34.0)
MCHC: 34.2 g/dL (ref 30.0–36.0)
MCV: 90.6 fL (ref 80.0–100.0)
Monocytes Absolute: 0.7 10*3/uL (ref 0.1–1.0)
Monocytes Relative: 5 %
Neutro Abs: 11.5 10*3/uL — ABNORMAL HIGH (ref 1.7–7.7)
Neutrophils Relative %: 88 %
Platelets: 337 10*3/uL (ref 150–400)
RBC: 3.71 MIL/uL — ABNORMAL LOW (ref 4.22–5.81)
RDW: 12.8 % (ref 11.5–15.5)
WBC: 13.1 10*3/uL — ABNORMAL HIGH (ref 4.0–10.5)
nRBC: 0 % (ref 0.0–0.2)

## 2019-05-02 LAB — COMPREHENSIVE METABOLIC PANEL
ALT: 22 U/L (ref 0–44)
AST: 20 U/L (ref 15–41)
Albumin: 2.6 g/dL — ABNORMAL LOW (ref 3.5–5.0)
Alkaline Phosphatase: 46 U/L (ref 38–126)
Anion gap: 10 (ref 5–15)
BUN: 36 mg/dL — ABNORMAL HIGH (ref 8–23)
CO2: 20 mmol/L — ABNORMAL LOW (ref 22–32)
Calcium: 8.4 mg/dL — ABNORMAL LOW (ref 8.9–10.3)
Chloride: 105 mmol/L (ref 98–111)
Creatinine, Ser: 1.15 mg/dL (ref 0.61–1.24)
GFR calc Af Amer: >60 mL/min (ref >60)
GFR calc non Af Amer: >60 mL/min (ref >60)
Glucose, Bld: 293 mg/dL — ABNORMAL HIGH (ref 70–99)
Potassium: 4.9 mmol/L (ref 3.5–5.1)
Sodium: 135 mmol/L (ref 135–145)
Total Bilirubin: 0.5 mg/dL (ref 0.3–1.2)
Total Protein: 6 g/dL — ABNORMAL LOW (ref 6.5–8.1)

## 2019-05-02 LAB — FERRITIN: Ferritin: 351 ng/mL — ABNORMAL HIGH (ref 24–336)

## 2019-05-02 LAB — GLUCOSE, CAPILLARY
Glucose-Capillary: 316 mg/dL — ABNORMAL HIGH (ref 70–99)
Glucose-Capillary: 425 mg/dL — ABNORMAL HIGH (ref 70–99)
Glucose-Capillary: 325 mg/dL — ABNORMAL HIGH (ref 70–99)
Glucose-Capillary: 346 mg/dL — ABNORMAL HIGH (ref 70–99)

## 2019-05-02 LAB — C-REACTIVE PROTEIN: CRP: 5.4 mg/dL — ABNORMAL HIGH (ref <1.0)

## 2019-05-02 LAB — D-DIMER, QUANTITATIVE: D-Dimer, Quant: 0.56 ug/mL-FEU — ABNORMAL HIGH (ref 0.00–0.50)

## 2019-05-02 LAB — PHOSPHORUS: Phosphorus: 2.6 mg/dL (ref 2.5–4.6)

## 2019-05-02 LAB — MAGNESIUM: Magnesium: 1.9 mg/dL (ref 1.7–2.4)

## 2019-05-02 MED ORDER — METHYLPREDNISOLONE SODIUM SUCC 40 MG IJ SOLR
40.0000 mg | Freq: Two times a day (BID) | INTRAMUSCULAR | Status: DC
  Administered 2019-05-02 – 2019-05-03 (×2): 40 mg via INTRAVENOUS
  Filled 2019-05-02: qty 1

## 2019-05-02 MED ORDER — INSULIN DETEMIR 100 UNIT/ML ~~LOC~~ SOLN
20.0000 [IU] | Freq: Two times a day (BID) | SUBCUTANEOUS | Status: DC
  Administered 2019-05-02 – 2019-05-03 (×3): 20 [IU] via SUBCUTANEOUS
  Filled 2019-05-02 (×4): qty 0.2

## 2019-05-02 MED ORDER — INSULIN ASPART 100 UNIT/ML ~~LOC~~ SOLN
6.0000 [IU] | Freq: Three times a day (TID) | SUBCUTANEOUS | Status: DC
  Administered 2019-05-02 – 2019-05-03 (×4): 6 [IU] via SUBCUTANEOUS

## 2019-05-02 NOTE — Progress Notes (Signed)
PROGRESS NOTE    Luke Edwards  [id number] DOB: 24-Sep-1940 DOA: 04/29/2019 PCP: Bonnita Nasuti, MD   Brief Narrative:  79 y.o.malewith medical history significant ofdiabetes, GERD, hyperlipidemia, and hypertension who presents to the ED due togradual onset of worseningshortness of breath, fever, loss of taste and smell x2 weekswithintermittent shortness of breathonexertion, and occasional dry cough or chest pains. He also admits to intermittent fevers x 2 week. T. Max 100.6 yesterday. Patient has been tested COVIDnegative x3 in last two weeks, and last test was onlast Thursday. Rapid Covid test came back positive.  During the hospitalization received IV fluids which resolved his acute kidney injury.  Was started on Solu-Medrol and remdesivir along with routine supportive care.   Assessment & Plan:   Principal Problem:   Acute respiratory failure with hypoxia (HCC) Active Problems:   COVID-19 virus infection   Acute respiratory distress syndrome (ARDS) due to COVID-19 virus (HCC)   HTN (hypertension)   HLD (hyperlipidemia)  Acute hypoxic respiratory failure secondary to COVID-19 pneumonia -Oxygen levels-down to 2 L nasal cannula -Remdesivir-day 4 -Solu-Medrol-day 4.  Frequency reduced to every 12 hours -Routine: Labs have been reviewed including ferritin, LDH, CRP, d-dimer, fibrinogen.  Will need to trend this lab daily. -Vitamin C & Zinc. Prone >16hrs/day.  -procalcitonin-negative, BNP 25 -Chest x-ray-minimal left basilar atelectasis -Supportive care-antitussive, inhalers, I-S/flutter -CODE STATUS confirmed  Acute kidney injury, admission creatinine 1.6 -Resolved with IV fluids.  Diabetes mellitus type 2, uncontrolled steroid-induced hyperglycemia -A1c 9.0.  Glipizide and Metformin on hold -Lantus increased to 20 units -NovoLog increased to 6 units  Essential hypertension -Labetalol as needed.  Lisinopril and HCTZ on hold due to AKI  BPH -Continue Flomax  and finasteride    DVT prophylaxis: Lovenox Code Status: Full code Family Communication: None at bedside Disposition Plan: Still requiring 2 L of nasal cannula and dyspnea with minimal exertion.  Expect improvement over next 24 hours and should be able to discharge him at that point.  Consultants:   None  Procedures:   None  Antimicrobials:   None   Subjective: Overall feels better than yesterday.  Have been sitting in the chair all day.  Still having exertional dyspnea and mild coughing.  Review of Systems Otherwise negative except as per HPI, including: General = no fevers, chills, dizziness, malaise, fatigue HEENT/EYES = negative for pain, redness, loss of vision, double vision, blurred vision, loss of hearing, sore throat, hoarseness, dysphagia Cardiovascular= negative for chest pain, palpitation, murmurs, lower extremity swelling Respiratory/lungs= negative for hemoptysis, wheezing, mucus production Gastrointestinal= negative for nausea, vomiting,, abdominal pain, melena, hematemesis Genitourinary= negative for Dysuria, Hematuria, Change in Urinary Frequency MSK = Negative for arthralgia, myalgias, Back Pain, Joint swelling  Neurology= Negative for headache, seizures, numbness, tingling  Psychiatry= Negative for anxiety, depression, suicidal and homocidal ideation Allergy/Immunology= Medication/Food allergy as listed  Skin= Negative for Rash, lesions, ulcers, itching   Objective: Vitals:   05/01/19 1700 05/01/19 2013 05/02/19 0300 05/02/19 0531  BP:  136/78 129/83 (!) 144/84  Pulse: 86 92 78 88  Resp: 19 20 18 20   Temp: 98 F (36.7 C) 98 F (36.7 C) 98.2 F (36.8 C) 97.8 F (36.6 C)  TempSrc: Axillary Oral Oral   SpO2: (!) 82% 93% 93% 96%  Weight:      Height:        Intake/Output Summary (Last 24 hours) at 05/02/2019 1147 Last data filed at 05/02/2019 0600 Gross per 24 hour  Intake 1300 ml  Output 1450 ml  Net -150 ml   Filed Weights   04/29/19  1029 04/29/19 1751  Weight: 96.2 kg 99.9 kg    Examination:  Constitutional: Not in acute distress, 2 L nasal cannula Respiratory: Minimal bibasilar crackles Cardiovascular: Normal sinus rhythm, no rubs Abdomen: Nontender nondistended good bowel sounds Musculoskeletal: No edema noted Skin: No rashes seen Neurologic: CN 2-12 grossly intact.  And nonfocal Psychiatric: Normal judgment and insight. Alert and oriented x 3. Normal mood.      Data Reviewed:   CBC: Recent Labs  Lab 04/29/19 1032 04/30/19 0628 05/01/19 0507 05/02/19 0437  WBC 6.2 6.6 11.1* 13.1*  NEUTROABS  --  5.3 9.6* 11.5*  HGB 13.3 12.9* 12.2* 11.5*  HCT 39.1 37.3* 36.0* 33.6*  MCV 91.1 89.7 91.1 90.6  PLT 245 268 304 [id number]   Basic Metabolic Panel: Recent Labs  Lab 04/29/19 1032 04/30/19 0628 05/01/19 0507 05/02/19 0437  NA 130* 132* 130* 135  K 4.5 3.9 4.8 4.9  CL 96* 99 102 105  CO2 20* 19* 18* 20*  GLUCOSE 315* 359* 393* 293*  BUN 25* 30* 39* 36*  CREATININE 1.60* 1.61* 1.36* 1.15  CALCIUM 8.3* 8.2* 8.1* 8.4*  MG  --  1.8 1.9 1.9  PHOS  --  4.5 3.1 2.6   GFR: Estimated Creatinine Clearance: 66.9 mL/min (by C-G formula based on SCr of 1.15 mg/dL). Liver Function Tests: Recent Labs  Lab 04/29/19 1439 04/30/19 0628 05/01/19 0507 05/02/19 0437  AST 30 29 25 20   ALT 25 27 25 22   ALKPHOS 52 48 49 46  BILITOT 0.9 0.7 0.6 0.5  PROT 7.9 7.2 6.4* 6.0*  ALBUMIN 3.6 3.0* 2.7* 2.6*   No results for input(s): LIPASE, AMYLASE in the last 168 hours. No results for input(s): AMMONIA in the last 168 hours. Coagulation Profile: No results for input(s): INR, PROTIME in the last 168 hours. Cardiac Enzymes: No results for input(s): CKTOTAL, CKMB, CKMBINDEX, TROPONINI in the last 168 hours. BNP (last 3 results) No results for input(s): PROBNP in the last 8760 hours. HbA1C: Recent Labs    04/29/19 1812  HGBA1C 9.0*   CBG: Recent Labs  Lab 05/01/19 0803 05/01/19 1222 05/01/19 1648  05/01/19 2147 05/02/19 0756  GLUCAP 356* 371* 350* 261* 316*   Lipid Profile: Recent Labs    04/29/19 1439  TRIG 93   Thyroid Function Tests: No results for input(s): TSH, T4TOTAL, FREET4, T3FREE, THYROIDAB in the last 72 hours. Anemia Panel: Recent Labs    05/01/19 0507 05/02/19 0437  FERRITIN 377* 351*   Sepsis Labs: Recent Labs  Lab 04/29/19 1439 04/29/19 1812  PROCALCITON <0.10  --   LATICACIDVEN 1.9 1.4    Recent Results (from the past 240 hour(s))  Blood Culture (routine x 2)     Status: None (Preliminary result)   Collection Time: 04/29/19  3:04 PM   Specimen: BLOOD  Result Value Ref Range Status   Specimen Description BLOOD LEFT ANTECUBITAL  Final   Special Requests   Final    BOTTLES DRAWN AEROBIC AND ANAEROBIC Blood Culture adequate volume   Culture   Final    NO GROWTH 3 DAYS Performed at [facility], [place] [address] [place], [place] [address]    Report Status PENDING  Incomplete  Blood Culture (routine x 2)     Status: None (Preliminary result)   Collection Time: 04/29/19  4:06 PM   Specimen: BLOOD  Result Value Ref Range Status   Specimen Description BLOOD RIGHT  ANTECUBITAL  Final   Special Requests   Final    BOTTLES DRAWN AEROBIC AND ANAEROBIC Blood Culture adequate volume   Culture   Final    NO GROWTH 3 DAYS Performed at [facility], [address] [place], [place] [address]    Report Status PENDING  Incomplete         Radiology Studies: No results found.      Scheduled Meds: . vitamin C  500 mg Oral Daily  . aspirin  81 mg Oral Daily  . enoxaparin (LOVENOX) injection  40 mg Subcutaneous Daily  . famotidine  20 mg Oral Daily  . finasteride  5 mg Oral Daily  . insulin aspart  0-20 Units Subcutaneous TID WC  . insulin aspart  0-5 Units Subcutaneous QHS  . insulin aspart  6 Units Subcutaneous TID WC  . insulin detemir  20 Units Subcutaneous BID  . Ipratropium-Albuterol  1 puff Inhalation Q6H  . linagliptin  5  mg Oral Daily  . methylPREDNISolone (SOLU-MEDROL) injection  40 mg Intravenous Q12H  . tamsulosin  0.4 mg Oral BID  . zinc sulfate  220 mg Oral Daily   Continuous Infusions: . remdesivir 100 mg in NS 100 mL 100 mg (05/02/19 0842)     LOS: 3 days   Time spent= 25 mins    Shirl Weir Arsenio Loader, MD [facility]  If 7PM-7AM, please contact night-coverage  05/02/2019, 11:47 AM

## 2019-05-02 NOTE — Plan of Care (Signed)

## 2019-05-02 NOTE — Progress Notes (Signed)
SATURATION QUALIFICATIONS: (This note is used to comply with regulatory documentation for home oxygen)  Patient Saturations on Room Air at Rest = 87%  Patient Saturations on Room Air while Ambulating = 85-87%  Patient Saturations on 2 Liters of oxygen while Ambulating = 95%  Please briefly explain why patient needs home oxygen:  Pt. At this point seems to need oxygen at rest and especially during exertion.  Took him off oxygen while walking in the room and while he cleaned himself up during ADLs.  At this time the patient stopped talking his face started to change colors and his breathing became labored.  So I put him back on oxygen, his color came back, he started to look more comfortable and he started talking about how much he loved former president Trump.  So I know he was feeling better.  Will continue to monitor patient.

## 2019-05-03 LAB — CBC WITH DIFFERENTIAL/PLATELET
Abs Immature Granulocytes: 0.12 10*3/uL — ABNORMAL HIGH (ref 0.00–0.07)
Basophils Absolute: 0 10*3/uL (ref 0.0–0.1)
Basophils Relative: 0 %
Eosinophils Absolute: 0 10*3/uL (ref 0.0–0.5)
Eosinophils Relative: 0 %
HCT: 31.7 % — ABNORMAL LOW (ref 39.0–52.0)
Hemoglobin: 11.1 g/dL — ABNORMAL LOW (ref 13.0–17.0)
Immature Granulocytes: 1 %
Lymphocytes Relative: 7 %
Lymphs Abs: 0.8 10*3/uL (ref 0.7–4.0)
MCH: 31.4 pg (ref 26.0–34.0)
MCHC: 35 g/dL (ref 30.0–36.0)
MCV: 89.5 fL (ref 80.0–100.0)
Monocytes Absolute: 1.1 10*3/uL — ABNORMAL HIGH (ref 0.1–1.0)
Monocytes Relative: 9 %
Neutro Abs: 10.2 10*3/uL — ABNORMAL HIGH (ref 1.7–7.7)
Neutrophils Relative %: 83 %
Platelets: 361 10*3/uL (ref 150–400)
RBC: 3.54 MIL/uL — ABNORMAL LOW (ref 4.22–5.81)
RDW: 12.7 % (ref 11.5–15.5)
WBC: 12.2 10*3/uL — ABNORMAL HIGH (ref 4.0–10.5)
nRBC: 0 % (ref 0.0–0.2)

## 2019-05-03 LAB — COMPREHENSIVE METABOLIC PANEL
ALT: 23 U/L (ref 0–44)
AST: 21 U/L (ref 15–41)
Albumin: 2.6 g/dL — ABNORMAL LOW (ref 3.5–5.0)
Alkaline Phosphatase: 44 U/L (ref 38–126)
Anion gap: 9 (ref 5–15)
BUN: 39 mg/dL — ABNORMAL HIGH (ref 8–23)
CO2: 18 mmol/L — ABNORMAL LOW (ref 22–32)
Calcium: 8.1 mg/dL — ABNORMAL LOW (ref 8.9–10.3)
Chloride: 103 mmol/L (ref 98–111)
Creatinine, Ser: 1.14 mg/dL (ref 0.61–1.24)
GFR calc Af Amer: >60 mL/min (ref >60)
GFR calc non Af Amer: >60 mL/min (ref >60)
Glucose, Bld: 345 mg/dL — ABNORMAL HIGH (ref 70–99)
Potassium: 4.3 mmol/L (ref 3.5–5.1)
Sodium: 130 mmol/L — ABNORMAL LOW (ref 135–145)
Total Bilirubin: 0.5 mg/dL (ref 0.3–1.2)
Total Protein: 5.7 g/dL — ABNORMAL LOW (ref 6.5–8.1)

## 2019-05-03 LAB — MAGNESIUM: Magnesium: 1.8 mg/dL (ref 1.7–2.4)

## 2019-05-03 LAB — D-DIMER, QUANTITATIVE: D-Dimer, Quant: 0.69 ug/mL-FEU — ABNORMAL HIGH (ref 0.00–0.50)

## 2019-05-03 LAB — C-REACTIVE PROTEIN: CRP: 3 mg/dL — ABNORMAL HIGH (ref <1.0)

## 2019-05-03 LAB — GLUCOSE, CAPILLARY
Glucose-Capillary: 266 mg/dL — ABNORMAL HIGH (ref 70–99)
Glucose-Capillary: 394 mg/dL — ABNORMAL HIGH (ref 70–99)

## 2019-05-03 LAB — FERRITIN: Ferritin: 281 ng/mL (ref 24–336)

## 2019-05-03 LAB — PHOSPHORUS: Phosphorus: 3.2 mg/dL (ref 2.5–4.6)

## 2019-05-03 MED ORDER — DEXAMETHASONE 6 MG PO TABS: 6.0000 mg | ORAL_TABLET | Freq: Two times a day (BID) | ORAL | 0 refills | Status: AC

## 2019-05-03 MED ORDER — ALBUTEROL SULFATE HFA 108 (90 BASE) MCG/ACT IN AERS: 2.0000 | INHALATION_SPRAY | RESPIRATORY_TRACT | 0 refills | Status: DC | PRN

## 2019-05-03 MED ORDER — IPRATROPIUM-ALBUTEROL 20-100 MCG/ACT IN AERS
1.0000 | INHALATION_SPRAY | Freq: Three times a day (TID) | RESPIRATORY_TRACT | Status: DC
  Administered 2019-05-03: 1 via RESPIRATORY_TRACT

## 2019-05-03 MED ORDER — IPRATROPIUM BROMIDE HFA 17 MCG/ACT IN AERS: 1.0000 | INHALATION_SPRAY | Freq: Three times a day (TID) | RESPIRATORY_TRACT | 0 refills | Status: DC

## 2019-05-03 MED FILL — DEXAMETHASONE 2 MG TABLET: 2 | 5 days supply | Qty: 30 | Fill #0

## 2019-05-03 NOTE — Discharge Summary (Signed)
Physician Discharge Summary  Renier Chaddock [id number] DOB: [birth year] DOA: 04/29/2019  PCP: Bonnita Nasuti, MD  Admit date: 04/29/2019 Discharge date: 05/03/2019  Admitted From: Home Disposition: Home  Recommendations for Outpatient Follow-up:  1. Follow up with PCP in 1-2 weeks 2. Please obtain BMP/CBC in one week your next doctors visit.  3. Oral Decadron for 5 days.  Bronchodilator inhalers prescribed.   Discharge Condition: Stable CODE STATUS: Full code Diet recommendation: 2 g salt  Brief/Interim Summary: 79 y.o.malewith medical history significant ofdiabetes, GERD, hyperlipidemia, and hypertension who presents to the ED due togradual onset of worseningshortness of breath, fever, loss of taste and smell x2 weekswithintermittent shortness of breathonexertion, and occasional dry cough or chest pains. He also admits to intermittent fevers x 2 week. T. Max 100.6 yesterday.Patient has been tested COVIDnegative x3 in last two weeks, and last test was onlast Thursday. Rapid Covid test came back positive.  During the hospitalization received IV fluids which resolved his acute kidney injury.  Was started on Solu-Medrol and remdesivir along with routine supportive care.  He completed 5-day course and was able to be weaned off oxygen.  His ambulatory pulse ox remained greater than 91%.  He was transitioned home with recommendations as stated above.   Acute hypoxic respiratory failure secondary to COVID-19 pneumonia -Currently patient on room air even with ambulation.  Completed 5-day course of remdesivir and IV steroids will be transitioned to oral dexamethasone.  Inflammatory markers improved.  Procalcitonin negative, BNP normal.  Acute kidney injury, admission creatinine 1.6 -Resolved with IV fluids.  Diabetes mellitus type 2, uncontrolled steroid-induced hyperglycemia -A1c 9.0.  Glipizide and Metformin can be resumed  Essential hypertension -Resume hydrochlorothiazide  and lisinopril  BPH -Continue Flomax and finasteride    Discharge Diagnoses:  Principal Problem:   Acute respiratory failure with hypoxia ([place]) Active Problems:   COVID-19 virus infection   Acute respiratory distress syndrome (ARDS) due to COVID-19 virus ([facility])   HTN (hypertension)   HLD (hyperlipidemia)    Consultations:  None  Subjective: Feels great, no complaints.  Ambulating in the hallway without any evidence of desaturations.  Discharge Exam: Vitals:   05/02/19 2000 05/03/19 0514  BP:  (!) 150/86  Pulse:  84  Resp: (!) 22   Temp:  97.6 F (36.4 C)  SpO2: 93%    Vitals:   05/02/19 1900 05/02/19 1930 05/02/19 2000 05/03/19 0514  BP:    (!) 150/86  Pulse: 97 97  84  Resp: (!) 22 20 (!) 22   Temp:    97.6 F (36.4 C)  TempSrc:    Oral  SpO2: 95% 96% 93%   Weight:      Height:        General: Pt is alert, awake, not in acute distress Cardiovascular: RRR, S1/S2 +, no rubs, no gallops Respiratory: CTA bilaterally, no wheezing, no rhonchi Abdominal: Soft, NT, ND, bowel sounds + Extremities: no edema, no cyanosis  Discharge Instructions   Allergies as of 05/03/2019   No Known Allergies     Medication List    STOP taking these medications   sulfamethoxazole-trimethoprim 800-160 MG tablet Commonly known as: BACTRIM DS   traMADol 50 MG tablet Commonly known as: ULTRAM     TAKE these medications   albuterol 108 (90 Base) MCG/ACT inhaler Commonly known as: VENTOLIN HFA Inhale 2 puffs into the lungs every 4 (four) hours as needed for wheezing or shortness of breath.   aspirin 81 MG chewable tablet Chew 1 tablet (  81 mg total) by mouth daily.   dexamethasone 6 MG tablet Commonly known as: DECADRON Take 1 tablet (6 mg total) by mouth 2 (two) times daily with a meal for 5 days.   famotidine 20 MG tablet Commonly known as: PEPCID Take 20 mg by mouth daily.   finasteride 5 MG tablet Commonly known as: PROSCAR Take 5 mg by mouth daily.    glipiZIDE 10 MG 24 hr tablet Commonly known as: GLUCOTROL XL Take 10 mg by mouth daily.   ipratropium 17 MCG/ACT inhaler Commonly known as: ATROVENT HFA Inhale 1 puff into the lungs every 8 (eight) hours.   lisinopril-hydrochlorothiazide 20-25 MG tablet Commonly known as: ZESTORETIC Take 1 tablet by mouth daily.   metFORMIN 1000 MG tablet Commonly known as: GLUCOPHAGE Take 1,000 mg by mouth 2 (two) times daily.   promethazine 25 MG tablet Commonly known as: PHENERGAN Take 25 mg by mouth 2 (two) times daily as needed for nausea.   simvastatin 40 MG tablet Commonly known as: ZOCOR Take 40 mg by mouth at bedtime.   tamsulosin 0.4 MG Caps capsule Commonly known as: FLOMAX Take 0.4 mg by mouth 2 (two) times daily.      Follow-up Information    [name], Rosalyn Charters, MD. Schedule an appointment as soon as possible for a visit in 1 week(s).   Specialty: Internal Medicine Contact information: [address] [place] [address] [phone]          No Known Allergies  You were cared for by a hospitalist during your hospital stay. If you have any questions about your discharge medications or the care you received while you were in the hospital after you are discharged, you can call the unit and asked to speak with the hospitalist on call if the hospitalist that took care of you is not available. Once you are discharged, your primary care physician will handle any further medical issues. Please note that no refills for any discharge medications will be authorized once you are discharged, as it is imperative that you return to your primary care physician (or establish a relationship with a primary care physician if you do not have one) for your aftercare needs so that they can reassess your need for medications and monitor your lab values.   Procedures/Studies: DG Chest Port 1 View  Result Date: 04/29/2019 CLINICAL DATA:  Fever EXAM: PORTABLE CHEST 1 VIEW COMPARISON:   Portable exam 1320 hours compared to 04/19/2017 FINDINGS: Normal heart size, mediastinal contours, and pulmonary vascularity. Minimal LEFT basilar atelectasis. Lungs otherwise clear. No acute infiltrate, pleural effusion or pneumothorax. Scattered endplate spur formation thoracic spine. IMPRESSION: Minimal LEFT basilar atelectasis. Electronically Signed   By: Lavonia Dana M.D.   On: 04/29/2019 13:28      The results of significant diagnostics from this hospitalization (including imaging, microbiology, ancillary and laboratory) are listed below for reference.     Microbiology: Recent Results (from the past 240 hour(s))  Blood Culture (routine x 2)     Status: None (Preliminary result)   Collection Time: 04/29/19  3:04 PM   Specimen: BLOOD  Result Value Ref Range Status   Specimen Description BLOOD LEFT ANTECUBITAL  Final   Special Requests   Final    BOTTLES DRAWN AEROBIC AND ANAEROBIC Blood Culture adequate volume   Culture   Final    NO GROWTH 4 DAYS Performed at [facility], [address] [place],  [address]    Report Status PENDING  Incomplete  Blood Culture (routine x 2)     Status: None (Preliminary result)   Collection Time: 04/29/19  4:06 PM   Specimen: BLOOD  Result Value Ref Range Status   Specimen Description BLOOD RIGHT ANTECUBITAL  Final   Special Requests   Final    BOTTLES DRAWN AEROBIC AND ANAEROBIC Blood Culture adequate volume   Culture   Final    NO GROWTH 4 DAYS Performed at [facility], [place] [address], [place], [place] [address]    Report Status PENDING  Incomplete     Labs: BNP (last 3 results) Recent Labs    05/01/19 0507  BNP 99991111   Basic Metabolic Panel: Recent Labs  Lab 04/29/19 1032 04/30/19 0628 05/01/19 0507 05/02/19 0437 05/03/19 0247  NA 130* 132* 130* 135 130*  K 4.5 3.9 4.8 4.9 4.3  CL 96* 99 102 105 103  CO2 20* 19* 18* 20* 18*  GLUCOSE 315* 359* 393* 293* 345*  BUN 25* 30* 39* 36* 39*  CREATININE 1.60*  1.61* 1.36* 1.15 1.14  CALCIUM 8.3* 8.2* 8.1* 8.4* 8.1*  MG  --  1.8 1.9 1.9 1.8  PHOS  --  4.5 3.1 2.6 3.2   Liver Function Tests: Recent Labs  Lab 04/29/19 1439 04/30/19 0628 05/01/19 0507 05/02/19 0437 05/03/19 0247  AST 30 29 25 20 21   ALT 25 27 25 22 23   ALKPHOS 52 48 49 46 44  BILITOT 0.9 0.7 0.6 0.5 0.5  PROT 7.9 7.2 6.4* 6.0* 5.7*  ALBUMIN 3.6 3.0* 2.7* 2.6* 2.6*   No results for input(s): LIPASE, AMYLASE in the last 168 hours. No results for input(s): AMMONIA in the last 168 hours. CBC: Recent Labs  Lab 04/29/19 1032 04/30/19 0628 05/01/19 0507 05/02/19 0437 05/03/19 0247  WBC 6.2 6.6 11.1* 13.1* 12.2*  NEUTROABS  --  5.3 9.6* 11.5* 10.2*  HGB 13.3 12.9* 12.2* 11.5* 11.1*  HCT 39.1 37.3* 36.0* 33.6* 31.7*  MCV 91.1 89.7 91.1 90.6 89.5  PLT 245 268 304 337 361   Cardiac Enzymes: No results for input(s): CKTOTAL, CKMB, CKMBINDEX, TROPONINI in the last 168 hours. BNP: Invalid input(s): POCBNP CBG: Recent Labs  Lab 05/02/19 0756 05/02/19 1146 05/02/19 1637 05/02/19 2154 05/03/19 0745  GLUCAP 316* 425* 325* 346* 266*   D-Dimer Recent Labs    05/02/19 0437 05/03/19 0247  DDIMER 0.56* 0.69*   Hgb A1c No results for input(s): HGBA1C in the last 72 hours. Lipid Profile No results for input(s): CHOL, HDL, LDLCALC, TRIG, CHOLHDL, LDLDIRECT in the last 72 hours. Thyroid function studies No results for input(s): TSH, T4TOTAL, T3FREE, THYROIDAB in the last 72 hours.  Invalid input(s): FREET3 Anemia work up Recent Labs    05/02/19 0437 05/03/19 0247  FERRITIN 351* 281   Urinalysis    Component Value Date/Time   COLORURINE YELLOW 08/09/2013 1150   APPEARANCEUR CLEAR 08/09/2013 1150   LABSPEC 1.014 08/09/2013 1150   PHURINE 6.0 08/09/2013 1150   GLUCOSEU 250 (A) 08/09/2013 1150   HGBUR NEGATIVE 08/09/2013 [place] 08/09/2013 1150   KETONESUR NEGATIVE 08/09/2013 1150   PROTEINUR NEGATIVE 08/09/2013 1150   UROBILINOGEN 0.2  08/09/2013 1150   NITRITE NEGATIVE 08/09/2013 [place] 08/09/2013 1150   Sepsis Labs Invalid input(s): PROCALCITONIN,  WBC,  LACTICIDVEN Microbiology Recent Results (from the past 240 hour(s))  Blood Culture (routine x 2)     Status: None (Preliminary result)   Collection Time: 04/29/19  3:04  PM   Specimen: BLOOD  Result Value Ref Range Status   Specimen Description BLOOD LEFT ANTECUBITAL  Final   Special Requests   Final    BOTTLES DRAWN AEROBIC AND ANAEROBIC Blood Culture adequate volume   Culture   Final    NO GROWTH 4 DAYS Performed at [facility], [address], [place], [place] [address]    Report Status PENDING  Incomplete  Blood Culture (routine x 2)     Status: None (Preliminary result)   Collection Time: 04/29/19  4:06 PM   Specimen: BLOOD  Result Value Ref Range Status   Specimen Description BLOOD RIGHT ANTECUBITAL  Final   Special Requests   Final    BOTTLES DRAWN AEROBIC AND ANAEROBIC Blood Culture adequate volume   Culture   Final    NO GROWTH 4 DAYS Performed at [facility], [place] [address] [place], [place] [address]    Report Status PENDING  Incomplete     Time coordinating discharge:  I have spent 35 minutes face to face with the patient and on the ward discussing the patients care, assessment, plan and disposition with other care givers. >50% of the time was devoted counseling the patient about the risks and benefits of treatment/Discharge disposition and coordinating care.   SIGNED:   Damita Lack, MD  [facility] 05/03/2019, 11:31 AM   If 7PM-7AM, please contact night-coverage

## 2019-05-03 NOTE — Care Management (Signed)
Pt to discharge home today.  CM did not receive referral for COVID at home program.  No CM needs determined - No consults written - discharge orders signed.  CM signing off

## 2019-05-03 NOTE — Progress Notes (Signed)
Inpatient Diabetes Program Recommendations  AACE/ADA: New Consensus Statement on Inpatient Glycemic Control (2015)  Target Ranges:  Prepandial:   less than 140 mg/dL      Peak postprandial:   less than 180 mg/dL (1-2 hours)      Critically ill patients:  140 - 180 mg/dL   Lab Results  Component Value Date   GLUCAP 266 (H) 05/03/2019   HGBA1C 9.0 (H) 04/29/2019    Review of Glycemic Control Results for Luke Edwards, Luke Edwards (MRN [id number]) as of 05/03/2019 09:22  Ref. Range 05/02/2019 07:56 05/02/2019 11:46 05/02/2019 16:37 05/02/2019 21:54 05/03/2019 07:45  Glucose-Capillary Latest Ref Range: 70 - 99 mg/dL 316 (H) 425 (H) 325 (H) 346 (H) 266 (H)   Diabetes history: DM 2 Outpatient Diabetes medications: Glipizide 10 mg Daily, Metformin 1000 mg bid Current orders for Inpatient glycemic control:  Levemir 20 units bid Novolog 0-20 units tid + hs Novolog 6 units tid Tradjenta 5 mg Daily  Solumedrol 40 mg Q12 hours  Inpatient Diabetes Program Recommendations:    Noted solumedrol frequency decreased to 40 mg Q12 hours  Fasting glucose 260's this am. If steroid dose remains at current dose consider: -  Levemir 25 units bid -  Novolog 10 units tid meal coverage.  Thanks,  Tama Headings RN, MSN, BC-ADM Inpatient Diabetes Coordinator Team Pager [phone] (8a-5p)

## 2019-05-03 NOTE — Progress Notes (Signed)
SATURATION QUALIFICATIONS: (This note is used to comply with regulatory documentation for home oxygen)  Patient Saturations on Room Air at Rest = 94%  Patient Saturations on Room Air while Ambulating = 91%  Patient Saturations on 2 Liters of oxygen while Ambulating = 96%  Please briefly explain why patient needs home oxygen:  Pt saturations stayed good today while he ambulated.  He walked 280 feet.  After walk he did look a little winded. His Saturations were 91% HR up 120's  And his RR were elevated about 25.  But overall he did well.

## 2019-05-03 NOTE — Discharge Instructions (Signed)
Person Under Monitoring Name: Luke Edwards  Location: [place] [address]   Infection Prevention Recommendations for Individuals Confirmed to have, or Being Evaluated for, 2019 Novel Coronavirus (COVID-19) Infection Who Receive Care at Home  Individuals who are confirmed to have, or are being evaluated for, COVID-19 should follow the prevention steps below until a healthcare provider or local or state health department says they can return to normal activities.  Stay home except to get medical care You should restrict activities outside your home, except for getting medical care. Do not go to work, school, or public areas, and do not use public transportation or taxis.  Call ahead before visiting your doctor Before your medical appointment, call the healthcare provider and tell them that you have, or are being evaluated for, COVID-19 infection. This will help the healthcare provider's office take steps to keep other people from getting infected. Ask your healthcare provider to call the local or state health department.  Monitor your symptoms Seek prompt medical attention if your illness is worsening (e.g., difficulty breathing). Before going to your medical appointment, call the healthcare provider and tell them that you have, or are being evaluated for, COVID-19 infection. Ask your healthcare provider to call the local or state health department.  Wear a facemask You should wear a facemask that covers your nose and mouth when you are in the same room with other people and when you visit a healthcare provider. People who live with or visit you should also wear a facemask while they are in the same room with you.  Separate yourself from other people in your home As much as possible, you should stay in a different room from other people in your home. Also, you should use a separate bathroom, if available.  Avoid sharing household items You should not share  dishes, drinking glasses, cups, eating utensils, towels, bedding, or other items with other people in your home. After using these items, you should wash them thoroughly with soap and water.  Cover your coughs and sneezes Cover your mouth and nose with a tissue when you cough or sneeze, or you can cough or sneeze into your sleeve. Throw used tissues in a lined trash can, and immediately wash your hands with soap and water for at least 20 seconds or use an alcohol-based hand rub.  Wash your [facility] your hands often and thoroughly with soap and water for at least 20 seconds. You can use an alcohol-based hand sanitizer if soap and water are not available and if your hands are not visibly dirty. Avoid touching your eyes, nose, and mouth with unwashed hands.   Prevention Steps for Caregivers and Household Members of Individuals Confirmed to have, or Being Evaluated for, COVID-19 Infection Being Cared for in the Home  If you live with, or provide care at home for, a person confirmed to have, or being evaluated for, COVID-19 infection please follow these guidelines to prevent infection:  Follow healthcare provider's instructions Make sure that you understand and can help the patient follow any healthcare provider instructions for all care.  Provide for the patient's basic needs You should help the patient with basic needs in the home and provide support for getting groceries, prescriptions, and other personal needs.  Monitor the patient's symptoms If they are getting sicker, call his or her medical provider and tell them that the patient has, or is being evaluated for, COVID-19 infection. This will help the healthcare provider's office take  steps to keep other people from getting infected. Ask the healthcare provider to call the local or state health department.  Limit the number of people who have contact with the patient  If possible, have only one caregiver for the patient.  Other  household members should stay in another home or place of residence. If this is not possible, they should stay  in another room, or be separated from the patient as much as possible. Use a separate bathroom, if available.  Restrict visitors who do not have an essential need to be in the home.  Keep older adults, very young children, and other sick people away from the patient Keep older adults, very young children, and those who have compromised immune systems or chronic health conditions away from the patient. This includes people with chronic heart, lung, or kidney conditions, diabetes, and cancer.  Ensure good ventilation Make sure that shared spaces in the home have good air flow, such as from an air conditioner or an opened window, weather permitting.  Wash your hands often  Wash your hands often and thoroughly with soap and water for at least 20 seconds. You can use an alcohol based hand sanitizer if soap and water are not available and if your hands are not visibly dirty.  Avoid touching your eyes, nose, and mouth with unwashed hands.  Use disposable paper towels to dry your hands. If not available, use dedicated cloth towels and replace them when they become wet.  Wear a facemask and gloves  Wear a disposable facemask at all times in the room and gloves when you touch or have contact with the patient's blood, body fluids, and/or secretions or excretions, such as sweat, saliva, sputum, nasal mucus, vomit, urine, or feces.  Ensure the mask fits over your nose and mouth tightly, and do not touch it during use.  Throw out disposable facemasks and gloves after using them. Do not reuse.  Wash your hands immediately after removing your facemask and gloves.  If your personal clothing becomes contaminated, carefully remove clothing and launder. Wash your hands after handling contaminated clothing.  Place all used disposable facemasks, gloves, and other waste in a lined container before  disposing them with other household waste.  Remove gloves and wash your hands immediately after handling these items.  Do not share dishes, glasses, or other household items with the patient  Avoid sharing household items. You should not share dishes, drinking glasses, cups, eating utensils, towels, bedding, or other items with a patient who is confirmed to have, or being evaluated for, COVID-19 infection.  After the person uses these items, you should wash them thoroughly with soap and water.  Wash laundry thoroughly  Immediately remove and wash clothes or bedding that have blood, body fluids, and/or secretions or excretions, such as sweat, saliva, sputum, nasal mucus, vomit, urine, or feces, on them.  Wear gloves when handling laundry from the patient.  Read and follow directions on labels of laundry or clothing items and detergent. In general, wash and dry with the warmest temperatures recommended on the label.  Clean all areas the individual has used often  Clean all touchable surfaces, such as counters, tabletops, doorknobs, bathroom fixtures, toilets, phones, keyboards, tablets, and bedside tables, every day. Also, clean any surfaces that may have blood, body fluids, and/or secretions or excretions on them.  Wear gloves when cleaning surfaces the patient has come in contact with.  Use a diluted bleach solution (e.g., dilute bleach with 1 part bleach  and 10 parts water) or a household disinfectant with a label that says EPA-registered for coronaviruses. To make a bleach solution at home, add 1 tablespoon of bleach to 1 quart (4 cups) of water. For a larger supply, add  cup of bleach to 1 gallon (16 cups) of water.  Read labels of cleaning products and follow recommendations provided on product labels. Labels contain instructions for safe and effective use of the cleaning product including precautions you should take when applying the product, such as wearing gloves or eye protection  and making sure you have good ventilation during use of the product.  Remove gloves and wash hands immediately after cleaning.  Monitor yourself for signs and symptoms of illness Caregivers and household members are considered close contacts, should monitor their health, and will be asked to limit movement outside of the home to the extent possible. Follow the monitoring steps for close contacts listed on the symptom monitoring form.   ? If you have additional questions, contact your local health department or call the epidemiologist on call at [phone] (available 24/7). ? This guidance is subject to change. For the most up-to-date guidance from [facility], please refer to their website: [email or web address]

## 2019-05-04 LAB — CULTURE, BLOOD (ROUTINE X 2)
Culture: NO GROWTH
Culture: NO GROWTH
Special Requests: ADEQUATE
Special Requests: ADEQUATE

## 2019-05-13 ENCOUNTER — Other Ambulatory Visit: Payer: Self-pay

## 2019-05-13 ENCOUNTER — Emergency Department (HOSPITAL_COMMUNITY)
Admission: EM | Admit: 2019-05-13 | Discharge: 2019-05-13 | Disposition: A | Discharge status: Home / Self Care | Payer: Medicare HMO | Attending: Emergency Medicine | Admitting: Emergency Medicine

## 2019-05-13 ENCOUNTER — Emergency Department (HOSPITAL_COMMUNITY): Payer: Medicare HMO

## 2019-05-13 ENCOUNTER — Encounter (HOSPITAL_COMMUNITY): Payer: Self-pay | Admitting: Emergency Medicine

## 2019-05-13 DIAGNOSIS — Z7982 Long term (current) use of aspirin: Secondary | ICD-10-CM | POA: Diagnosis not present

## 2019-05-13 DIAGNOSIS — J45909 Unspecified asthma, uncomplicated: Secondary | ICD-10-CM | POA: Insufficient documentation

## 2019-05-13 DIAGNOSIS — R0602 Shortness of breath: Secondary | ICD-10-CM | POA: Insufficient documentation

## 2019-05-13 DIAGNOSIS — Z79899 Other long term (current) drug therapy: Secondary | ICD-10-CM | POA: Insufficient documentation

## 2019-05-13 DIAGNOSIS — U071 COVID-19: Secondary | ICD-10-CM | POA: Diagnosis not present

## 2019-05-13 DIAGNOSIS — I10 Essential (primary) hypertension: Secondary | ICD-10-CM | POA: Diagnosis not present

## 2019-05-13 DIAGNOSIS — R531 Weakness: Secondary | ICD-10-CM | POA: Diagnosis present

## 2019-05-13 LAB — URINALYSIS, ROUTINE W REFLEX MICROSCOPIC
Bilirubin Urine: NEGATIVE
Glucose, UA: 50 mg/dL — AB
Hgb urine dipstick: NEGATIVE
Ketones, ur: 5 mg/dL — AB
Leukocytes,Ua: NEGATIVE
Nitrite: NEGATIVE
Protein, ur: NEGATIVE mg/dL
Specific Gravity, Urine: 1.013 (ref 1.005–1.030)
pH: 5 (ref 5.0–8.0)

## 2019-05-13 LAB — CBC WITH DIFFERENTIAL/PLATELET
Abs Immature Granulocytes: 0.11 10*3/uL — ABNORMAL HIGH (ref 0.00–0.07)
Basophils Absolute: 0 10*3/uL (ref 0.0–0.1)
Basophils Relative: 0 %
Eosinophils Absolute: 0.2 10*3/uL (ref 0.0–0.5)
Eosinophils Relative: 2 %
HCT: 33.9 % — ABNORMAL LOW (ref 39.0–52.0)
Hemoglobin: 11.2 g/dL — ABNORMAL LOW (ref 13.0–17.0)
Immature Granulocytes: 1 %
Lymphocytes Relative: 12 %
Lymphs Abs: 1.3 10*3/uL (ref 0.7–4.0)
MCH: 30.9 pg (ref 26.0–34.0)
MCHC: 33 g/dL (ref 30.0–36.0)
MCV: 93.6 fL (ref 80.0–100.0)
Monocytes Absolute: 1.1 10*3/uL — ABNORMAL HIGH (ref 0.1–1.0)
Monocytes Relative: 10 %
Neutro Abs: 8.2 10*3/uL — ABNORMAL HIGH (ref 1.7–7.7)
Neutrophils Relative %: 75 %
Platelets: 282 10*3/uL (ref 150–400)
RBC: 3.62 MIL/uL — ABNORMAL LOW (ref 4.22–5.81)
RDW: 12.7 % (ref 11.5–15.5)
WBC: 10.9 10*3/uL — ABNORMAL HIGH (ref 4.0–10.5)
nRBC: 0 % (ref 0.0–0.2)

## 2019-05-13 LAB — COMPREHENSIVE METABOLIC PANEL
ALT: 21 U/L (ref 0–44)
AST: 15 U/L (ref 15–41)
Albumin: 2.6 g/dL — ABNORMAL LOW (ref 3.5–5.0)
Alkaline Phosphatase: 52 U/L (ref 38–126)
Anion gap: 12 (ref 5–15)
BUN: 29 mg/dL — ABNORMAL HIGH (ref 8–23)
CO2: 21 mmol/L — ABNORMAL LOW (ref 22–32)
Calcium: 8.8 mg/dL — ABNORMAL LOW (ref 8.9–10.3)
Chloride: 97 mmol/L — ABNORMAL LOW (ref 98–111)
Creatinine, Ser: 1.74 mg/dL — ABNORMAL HIGH (ref 0.61–1.24)
GFR calc Af Amer: 43 mL/min — ABNORMAL LOW (ref >60)
GFR calc non Af Amer: 37 mL/min — ABNORMAL LOW (ref >60)
Glucose, Bld: 239 mg/dL — ABNORMAL HIGH (ref 70–99)
Potassium: 4.5 mmol/L (ref 3.5–5.1)
Sodium: 130 mmol/L — ABNORMAL LOW (ref 135–145)
Total Bilirubin: 0.8 mg/dL (ref 0.3–1.2)
Total Protein: 5.8 g/dL — ABNORMAL LOW (ref 6.5–8.1)

## 2019-05-13 LAB — D-DIMER, QUANTITATIVE: D-Dimer, Quant: 1.04 ug/mL-FEU — ABNORMAL HIGH (ref 0.00–0.50)

## 2019-05-13 LAB — TROPONIN I (HIGH SENSITIVITY): Troponin I (High Sensitivity): 7 ng/L (ref <18)

## 2019-05-13 LAB — BRAIN NATRIURETIC PEPTIDE: B Natriuretic Peptide: 20.9 pg/mL (ref 0.0–100.0)

## 2019-05-13 MED ORDER — SODIUM CHLORIDE 0.9 % IV BOLUS
1000.0000 mL | Freq: Once | INTRAVENOUS | Status: AC
  Administered 2019-05-13: 08:00:00 1000 mL via INTRAVENOUS

## 2019-05-13 NOTE — Discharge Instructions (Signed)
Return if any problems.  See your Physician for recheck  °

## 2019-05-13 NOTE — ED Notes (Addendum)
Patient verbalizes understanding of discharge instructions. Opportunity for questioning and answers were provided. Armband removed by staff, pt discharged from ED to wait in waiting room for oxygen tank. Cameilla, RN to follow up with patient in waiting area.

## 2019-05-13 NOTE — Discharge Planning (Signed)
[facility] consulted regarding qualifying pt for home oxygen in ED.  EDCM confirmed with DME company that hospital admission is waived for Covid+ patients at this time, and they MAY qualify in the ED.  EDCM communicated to RN to obtain Saturation Qualification information.

## 2019-05-13 NOTE — ED Notes (Addendum)
SATURATION QUALIFICATIONS: (This note is used to comply with regulatory documentation for home oxygen)  Patient Saturations on Room Air at Rest = 88%  Patient Saturations on Room Air while Ambulating = 86%  Patient Saturations on 2L Liters of oxygen while Ambulating = 98%  Please briefly explain why patient needs home oxygen: PMH of COVID+.

## 2019-05-13 NOTE — ED Provider Notes (Signed)
[facility] EMERGENCY DEPARTMENT Provider Note   CSN: [id number] Arrival date & time: 05/13/19  [id number]     History Chief Complaint  Patient presents with  . Weakness  . COVID    Luke Edwards is a 79 y.o. male.  The history is provided by the patient. No language interpreter was used.  Weakness Severity:  Moderate Onset quality:  Gradual Timing:  Constant Progression:  Worsening Chronicity:  New Relieved by:  Nothing Worsened by:  Nothing Ineffective treatments:  None tried Associated symptoms: shortness of breath   Associated symptoms: no abdominal pain   Risk factors: no congestive heart failure and no coronary artery disease   Shortness of Breath Associated symptoms: no abdominal pain      Pt was discharged from the hospital on 1/22 after being admitted with covid.  Pt reports he is using 2 liters of 02.  Pt reports oxygen is his daughters.   Past Medical History:  Diagnosis Date  . Diabetes mellitus without complication ([place])   . GERD (gastroesophageal reflux disease)   . HLD (hyperlipidemia)   . Hypertension     Patient Active Problem List   Diagnosis Date Noted  . HTN (hypertension) 05/01/2019  . HLD (hyperlipidemia) 05/01/2019  . Acute respiratory failure with hypoxia ([place]) 05/01/2019  . COVID-19 virus infection 04/29/2019  . Acute respiratory distress syndrome (ARDS) due to COVID-19 virus ([place]) 04/29/2019  . Chest pain 03/05/2017    Past Surgical History:  Procedure Laterality Date  . CHOLECYSTECTOMY         Family History  Problem Relation Age of Onset  . Valvular heart disease Mother   . Heart attack Father        54's    Social History   Tobacco Use  . Smoking status: Never Smoker  . Smokeless tobacco: Never Used  Substance Use Topics  . Alcohol use: Yes    Alcohol/week: 2.0 standard drinks    Types: 2 Cans of beer per week    Comment: Once during the week   . Drug use: No    Home Medications Prior to  Admission medications   Medication Sig Start Date End Date Taking? Authorizing Provider  albuterol (VENTOLIN HFA) 108 (90 Base) MCG/ACT inhaler Inhale 2 puffs into the lungs every 4 (four) hours as needed for wheezing or shortness of breath. 05/03/19  Yes Amin, Jeanella Flattery, MD  aspirin 81 MG chewable tablet Chew 1 tablet (81 mg total) by mouth daily. 03/07/17  Yes Santos-Sanchez, Merlene Morse, MD  cholecalciferol (VITAMIN D3) 25 MCG (1000 UNIT) tablet Take 1,000 Units by mouth daily.   Yes [provider]  famotidine (PEPCID) 20 MG tablet Take 20 mg by mouth daily. 03/04/19  Yes [provider]  finasteride (PROSCAR) 5 MG tablet Take 5 mg by mouth every evening.  12/11/18  Yes [provider]  glipiZIDE (GLUCOTROL XL) 10 MG 24 hr tablet Take 10 mg by mouth daily.  03/10/17  Yes [provider]  insulin regular (NOVOLIN R) 100 units/mL injection Inject 2-10 Units into the skin See admin instructions. Inject per sliding scale three times daily: 150-200=2u, 201-250=4u, 251-300=6u, 301-350=8u, 351-400=10   Yes [provider]  ipratropium (ATROVENT HFA) 17 MCG/ACT inhaler Inhale 1 puff into the lungs every 8 (eight) hours. 05/03/19 05/02/20 Yes Amin, Jeanella Flattery, MD  lisinopril-hydrochlorothiazide (PRINZIDE,ZESTORETIC) 20-25 MG per tablet Take 1 tablet by mouth daily.   Yes [provider]  metFORMIN (GLUCOPHAGE) 1000 MG tablet Take 1,000  mg by mouth 2 (two) times daily.  02/26/17  Yes [provider]  Multiple Vitamin (MULTIVITAMIN WITH MINERALS) TABS tablet Take 1 tablet by mouth daily.   Yes [provider]  Multiple Vitamins-Minerals (ZINC PO) Take 1 tablet by mouth daily.   Yes [provider]  promethazine (PHENERGAN) 25 MG tablet Take 25 mg by mouth 2 (two) times daily as needed for nausea. 04/24/19  Yes [provider]  simvastatin (ZOCOR) 40 MG tablet Take 40 mg by mouth at bedtime. 04/25/19  Yes [provider]  sulfamethoxazole-trimethoprim (BACTRIM DS) 800-160 MG tablet Take 1 tablet by mouth 2 (two) times daily. 05/06/19  Yes [provider]  tamsulosin (FLOMAX) 0.4 MG CAPS capsule Take 0.4 mg by mouth daily after supper.  02/20/19  Yes [provider]  vitamin B-12 (CYANOCOBALAMIN) 500 MCG tablet Take 500 mcg by mouth daily.   Yes [provider]    Allergies    Patient has no known allergies.  Review of Systems   Review of Systems  Respiratory: Positive for shortness of breath.   Gastrointestinal: Negative for abdominal pain.  Neurological: Positive for weakness.  All other systems reviewed and are negative.   Physical Exam Updated Vital Signs BP 115/77   Pulse 81   Temp 98.2 F (36.8 C) (Oral)   Resp 14   SpO2 100%   Physical Exam Vitals and nursing note reviewed.  Constitutional:      Appearance: He is well-developed.  HENT:     Head: Normocephalic and atraumatic.  Eyes:     Conjunctiva/sclera: Conjunctivae normal.  Cardiovascular:     Rate and Rhythm: Normal rate and regular rhythm.     Heart sounds: No murmur.  Pulmonary:     Effort: Pulmonary effort is normal. No respiratory distress.     Breath sounds: Normal breath sounds.  Abdominal:     Palpations: Abdomen is soft.     Tenderness: There is no abdominal tenderness.  Musculoskeletal:        General: Normal range of motion.     Cervical back: Neck supple.  Skin:    General: Skin is warm and dry.  Neurological:     General: No focal deficit present.     Mental Status: He is alert.  Psychiatric:        Mood and Affect: Mood normal.     ED Results / Procedures / Treatments   Labs (all labs ordered are listed, but only abnormal results are displayed) Labs Reviewed  CBC WITH DIFFERENTIAL/PLATELET - Abnormal; Notable for the following components:      Result Value   WBC 10.9 (*)    RBC 3.62 (*)    Hemoglobin 11.2 (*)    HCT 33.9 (*)    Neutro Abs 8.2 (*)    Monocytes  Absolute 1.1 (*)    Abs Immature Granulocytes 0.11 (*)    All other components within normal limits  COMPREHENSIVE METABOLIC PANEL - Abnormal; Notable for the following components:   Sodium 130 (*)    Chloride 97 (*)    CO2 21 (*)    Glucose, Bld 239 (*)    BUN 29 (*)    Creatinine, Ser 1.74 (*)    Calcium 8.8 (*)    Total Protein 5.8 (*)    Albumin 2.6 (*)    GFR calc non Af Amer 37 (*)    GFR calc Af Amer 43 (*)    All other components within normal  limits  URINALYSIS, ROUTINE W REFLEX MICROSCOPIC - Abnormal; Notable for the following components:   Glucose, UA 50 (*)    Ketones, ur 5 (*)    All other components within normal limits  D-DIMER, QUANTITATIVE (NOT AT [facility]) - Abnormal; Notable for the following components:   D-Dimer, Quant 1.04 (*)    All other components within normal limits  BRAIN NATRIURETIC PEPTIDE  TROPONIN I (HIGH SENSITIVITY)  TROPONIN I (HIGH SENSITIVITY)    EKG EKG Interpretation  Date/Time:  Monday May 13 2019 07:50:36 EST Ventricular Rate:  87 PR Interval:    QRS Duration: 94 QT Interval:  374 QTC Calculation: 450 R Axis:   -21 Text Interpretation: Sinus rhythm Ventricular premature complex Borderline left axis deviation Abnormal R-wave progression, early transition normal except PVC, no change from previous Confirmed by Charlesetta Shanks [phone]) on 05/13/2019 8:33:17 AM   Radiology DG Chest Port 1 View  Result Date: 05/13/2019 CLINICAL DATA:  Weakness. Shortness of breath. COVID-19 diagnosed on 04/29/2019. EXAM: PORTABLE CHEST 1 VIEW COMPARISON:  04/29/2019 FINDINGS: The heart size and pulmonary vascularity are normal. There are small areas of atelectasis at both lung bases, new on the right and minimally more prominent on the left. The lungs are otherwise clear. No effusions. No bone abnormality. IMPRESSION: Slight bibasilar atelectasis. Electronically Signed   By: Lorriane Shire M.D.   On: 05/13/2019 08:08    Procedures Procedures (including  critical care time)  Medications Ordered in ED Medications  sodium chloride 0.9 % bolus 1,000 mL (0 mLs Intravenous Stopped 05/13/19 1058)    ED Course  I have reviewed the triage vital signs and the nursing notes.  Pertinent labs & imaging results that were available during my care of the patient were reviewed by me and considered in my medical decision making (see chart for details).  Clinical Course as of May 12 1454  Mon May 13, 2019  1350 Comprehensive metabolic panel(!) [LS]    Clinical Course User Index [LS] Sidney Ace   MDM Rules/Calculators/A&P                      MDM:  Chest xray no pneumonia.  Labs reviewed, no cahnge since discharge.  Pt feels better after IV fluids  Pt request home 02.  Camilla RN will attempt to get delivered to pt. Final Clinical Impression(s) / ED Diagnoses Final diagnoses:  [id number]    Rx / DC Orders ED Discharge Orders    None    An After Visit Summary was printed and given to the patient.    Fransico Meadow, [place] 05/13/19 Daniels, MD 05/20/19 1428

## 2019-05-13 NOTE — ED Triage Notes (Signed)
Pt arrives to ED from home with complaints of on going weakness since his COVID+ test 3 weeks ago. Patient states that he has no energy and is having trouble getting around his house, eating, and drinking.

## 2019-05-13 NOTE — Discharge Summary (Signed)
Fuller Mandril, RN, BSN, [place] [phone] Pt qualifies for DME oxygen.  DME  ordered through [employer].  [facility] notified to deliver oxygen to pt room D/C home.

## 2019-05-13 NOTE — ED Provider Notes (Signed)
Medical screening examination/treatment/procedure(s) were conducted as a shared visit with non-physician practitioner(s) and myself.  I personally evaluated the patient during the encounter.  EKG Interpretation  Date/Time:  Monday May 13 2019 07:50:36 EST Ventricular Rate:  87 PR Interval:    QRS Duration: 94 QT Interval:  374 QTC Calculation: 450 R Axis:   -21 Text Interpretation: Sinus rhythm Ventricular premature complex Borderline left axis deviation Abnormal R-wave progression, early transition normal except PVC, no change from previous Confirmed by Charlesetta Shanks [phone]) on 05/13/2019 8:33:17 AM  Patient reports he is been suffering from generalized weakness.  He reports before contracting Covid he was very well and active.  He tested +3 weeks ago.  He denies that he is having problems with any chest pain or significant shortness of breath.  He is not coughing much.  He denies he is having any vomiting or diarrhea but reports that he is not eating or drinking much because he has no appetite.  He reports that his daughters have been encouraging him to take Pedialyte and boost.  He reports they talked his a doctor today and were hoping to get a home health nurse to come by and give him fluids with the thought that he is dehydrated.  They were unable to get additional in-home care and presented to the emergency department.  Patient is alert and appropriate.  No confusion.  Heart is regular.  Sinus rhythm on the monitor in the 80s to low 90s.  Lungs are grossly clear.  Patient shows no respiratory distress and is not coughing.  His abdomen is soft and nontender to palpation.  Lower extremities have trace pitting edema.  The calves are soft and nontender to palpation.  Skin condition is good.  No cellulitis.  Patient felt significantly improved after 1 L IV bolus fluids.  Labs are stable in appearance.  Patient does not have hypotension.  He has been wearing 2 L nasal cannula oxygen since his  diagnosis and treatment for Covid.  At this time, patient is stable for continued outpatient management.  Instructions to return with any worsening or changing symptoms.   Charlesetta Shanks, MD 05/13/19 [phone]

## 2019-06-22 ENCOUNTER — Other Ambulatory Visit: Payer: Self-pay

## 2019-06-22 ENCOUNTER — Inpatient Hospital Stay (HOSPITAL_COMMUNITY)
Admission: EM | Admit: 2019-06-22 | Discharge: 2019-06-27 | DRG: 057 | Disposition: A | Discharge status: Home Health | Payer: Medicare HMO | Attending: Internal Medicine | Admitting: Internal Medicine

## 2019-06-22 ENCOUNTER — Inpatient Hospital Stay (HOSPITAL_COMMUNITY): Payer: Medicare HMO

## 2019-06-22 ENCOUNTER — Encounter (HOSPITAL_COMMUNITY): Payer: Self-pay | Admitting: Emergency Medicine

## 2019-06-22 ENCOUNTER — Emergency Department (HOSPITAL_COMMUNITY): Payer: Medicare HMO

## 2019-06-22 DIAGNOSIS — G255 Other chorea: Principal | ICD-10-CM | POA: Diagnosis present

## 2019-06-22 DIAGNOSIS — E1169 Type 2 diabetes mellitus with other specified complication: Secondary | ICD-10-CM | POA: Diagnosis present

## 2019-06-22 DIAGNOSIS — Z8249 Family history of ischemic heart disease and other diseases of the circulatory system: Secondary | ICD-10-CM | POA: Diagnosis not present

## 2019-06-22 DIAGNOSIS — Z8673 Personal history of transient ischemic attack (TIA), and cerebral infarction without residual deficits: Secondary | ICD-10-CM

## 2019-06-22 DIAGNOSIS — Z7982 Long term (current) use of aspirin: Secondary | ICD-10-CM

## 2019-06-22 DIAGNOSIS — E1165 Type 2 diabetes mellitus with hyperglycemia: Secondary | ICD-10-CM | POA: Diagnosis present

## 2019-06-22 DIAGNOSIS — D649 Anemia, unspecified: Secondary | ICD-10-CM

## 2019-06-22 DIAGNOSIS — Z8701 Personal history of pneumonia (recurrent): Secondary | ICD-10-CM

## 2019-06-22 DIAGNOSIS — E785 Hyperlipidemia, unspecified: Secondary | ICD-10-CM | POA: Diagnosis present

## 2019-06-22 DIAGNOSIS — R258 Other abnormal involuntary movements: Secondary | ICD-10-CM

## 2019-06-22 DIAGNOSIS — Z79899 Other long term (current) drug therapy: Secondary | ICD-10-CM | POA: Diagnosis not present

## 2019-06-22 DIAGNOSIS — U071 COVID-19: Secondary | ICD-10-CM | POA: Diagnosis present

## 2019-06-22 DIAGNOSIS — Z794 Long term (current) use of insulin: Secondary | ICD-10-CM | POA: Diagnosis not present

## 2019-06-22 DIAGNOSIS — E119 Type 2 diabetes mellitus without complications: Secondary | ICD-10-CM

## 2019-06-22 DIAGNOSIS — I10 Essential (primary) hypertension: Secondary | ICD-10-CM | POA: Diagnosis present

## 2019-06-22 DIAGNOSIS — K219 Gastro-esophageal reflux disease without esophagitis: Secondary | ICD-10-CM | POA: Diagnosis present

## 2019-06-22 DIAGNOSIS — N400 Enlarged prostate without lower urinary tract symptoms: Secondary | ICD-10-CM | POA: Diagnosis present

## 2019-06-22 DIAGNOSIS — R4182 Altered mental status, unspecified: Secondary | ICD-10-CM | POA: Diagnosis present

## 2019-06-22 DIAGNOSIS — Z8616 Personal history of COVID-19: Secondary | ICD-10-CM

## 2019-06-22 DIAGNOSIS — E871 Hypo-osmolality and hyponatremia: Secondary | ICD-10-CM

## 2019-06-22 HISTORY — DX: Hypo-osmolality and hyponatremia: E87.1

## 2019-06-22 HISTORY — DX: Anemia, unspecified: D64.9

## 2019-06-22 HISTORY — DX: Other chorea: G25.5

## 2019-06-22 HISTORY — DX: COVID-19: U07.1

## 2019-06-22 HISTORY — DX: Type 2 diabetes mellitus without complications: E11.9

## 2019-06-22 LAB — CBC WITH DIFFERENTIAL/PLATELET
Abs Immature Granulocytes: 0.05 10*3/uL (ref 0.00–0.07)
Basophils Absolute: 0.1 10*3/uL (ref 0.0–0.1)
Basophils Relative: 1 %
Eosinophils Absolute: 0.1 10*3/uL (ref 0.0–0.5)
Eosinophils Relative: 1 %
HCT: 33.1 % — ABNORMAL LOW (ref 39.0–52.0)
Hemoglobin: 11.7 g/dL — ABNORMAL LOW (ref 13.0–17.0)
Immature Granulocytes: 1 %
Lymphocytes Relative: 26 %
Lymphs Abs: 2.3 10*3/uL (ref 0.7–4.0)
MCH: 31.7 pg (ref 26.0–34.0)
MCHC: 35.3 g/dL (ref 30.0–36.0)
MCV: 89.7 fL (ref 80.0–100.0)
Monocytes Absolute: 1 10*3/uL (ref 0.1–1.0)
Monocytes Relative: 11 %
Neutro Abs: 5.6 10*3/uL (ref 1.7–7.7)
Neutrophils Relative %: 60 %
Platelets: 270 10*3/uL (ref 150–400)
RBC: 3.69 MIL/uL — ABNORMAL LOW (ref 4.22–5.81)
RDW: 13.5 % (ref 11.5–15.5)
WBC: 9.1 10*3/uL (ref 4.0–10.5)
nRBC: 0 % (ref 0.0–0.2)

## 2019-06-22 LAB — URINALYSIS, ROUTINE W REFLEX MICROSCOPIC
Bilirubin Urine: NEGATIVE
Glucose, UA: 50 mg/dL — AB
Hgb urine dipstick: NEGATIVE
Ketones, ur: NEGATIVE mg/dL
Leukocytes,Ua: NEGATIVE
Nitrite: NEGATIVE
Protein, ur: NEGATIVE mg/dL
Specific Gravity, Urine: 1.01 (ref 1.005–1.030)
pH: 6 (ref 5.0–8.0)

## 2019-06-22 LAB — COMPREHENSIVE METABOLIC PANEL
ALT: 17 U/L (ref 0–44)
AST: 21 U/L (ref 15–41)
Albumin: 3.4 g/dL — ABNORMAL LOW (ref 3.5–5.0)
Alkaline Phosphatase: 55 U/L (ref 38–126)
Anion gap: 13 (ref 5–15)
BUN: 9 mg/dL (ref 8–23)
CO2: 23 mmol/L (ref 22–32)
Calcium: 8.7 mg/dL — ABNORMAL LOW (ref 8.9–10.3)
Chloride: 88 mmol/L — ABNORMAL LOW (ref 98–111)
Creatinine, Ser: 1.02 mg/dL (ref 0.61–1.24)
GFR calc Af Amer: >60 mL/min (ref >60)
GFR calc non Af Amer: >60 mL/min (ref >60)
Glucose, Bld: 133 mg/dL — ABNORMAL HIGH (ref 70–99)
Potassium: 3.7 mmol/L (ref 3.5–5.1)
Sodium: 124 mmol/L — ABNORMAL LOW (ref 135–145)
Total Bilirubin: 0.8 mg/dL (ref 0.3–1.2)
Total Protein: 6.2 g/dL — ABNORMAL LOW (ref 6.5–8.1)

## 2019-06-22 LAB — CSF CELL COUNT WITH DIFFERENTIAL
RBC Count, CSF: 0 /mm3
RBC Count, CSF: 0 /mm3
Tube #: 1
Tube #: 4
WBC, CSF: 1 /mm3 (ref 0–5)
WBC, CSF: 1 /mm3 (ref 0–5)

## 2019-06-22 LAB — RAPID URINE DRUG SCREEN, HOSP PERFORMED
Amphetamines: NOT DETECTED
Barbiturates: NOT DETECTED
Benzodiazepines: POSITIVE — AB
Cocaine: NOT DETECTED
Opiates: NOT DETECTED
Tetrahydrocannabinol: NOT DETECTED

## 2019-06-22 LAB — GLUCOSE, CSF: Glucose, CSF: 56 mg/dL (ref 40–70)

## 2019-06-22 LAB — SEDIMENTATION RATE: Sed Rate: 20 mm/hr — ABNORMAL HIGH (ref 0–16)

## 2019-06-22 LAB — PROTEIN, CSF: Total  Protein, CSF: 35 mg/dL (ref 15–45)

## 2019-06-22 LAB — ETHANOL: Alcohol, Ethyl (B): <10 mg/dL (ref <10)

## 2019-06-22 MED ORDER — ENOXAPARIN SODIUM 40 MG/0.4ML ~~LOC~~ SOLN
40.0000 mg | SUBCUTANEOUS | Status: DC
  Administered 2019-06-22 – 2019-06-26 (×5): 40 mg via SUBCUTANEOUS
  Filled 2019-06-22 (×5): qty 0.4

## 2019-06-22 MED ORDER — IOHEXOL 300 MG/ML  SOLN
100.0000 mL | Freq: Once | INTRAMUSCULAR | Status: AC | PRN
  Administered 2019-06-22: 100 mL via INTRAVENOUS

## 2019-06-22 MED ORDER — VITAMIN B-12 2500 MCG SL SUBL: 2500.0000 ug | SUBLINGUAL_TABLET | Freq: Every day | SUBLINGUAL | Status: DC

## 2019-06-22 MED ORDER — LIDOCAINE HCL 2 % IJ SOLN
10.0000 mL | Freq: Once | INTRAMUSCULAR | Status: AC
  Administered 2019-06-22: 15:00:00 200 mg via INTRADERMAL
  Filled 2019-06-22: qty 20

## 2019-06-22 MED ORDER — FAMOTIDINE 20 MG PO TABS
20.0000 mg | ORAL_TABLET | Freq: Every day | ORAL | Status: DC
  Administered 2019-06-23 – 2019-06-27 (×5): 20 mg via ORAL
  Filled 2019-06-22: qty 2
  Filled 2019-06-22 (×4): qty 1

## 2019-06-22 MED ORDER — ASPIRIN EC 81 MG PO TBEC
81.0000 mg | DELAYED_RELEASE_TABLET | Freq: Every day | ORAL | Status: DC
  Administered 2019-06-23 – 2019-06-27 (×5): 81 mg via ORAL
  Filled 2019-06-22 (×5): qty 1

## 2019-06-22 MED ORDER — FINASTERIDE 5 MG PO TABS
5.0000 mg | ORAL_TABLET | Freq: Every day | ORAL | Status: DC
  Administered 2019-06-22 – 2019-06-26 (×5): 5 mg via ORAL
  Filled 2019-06-22 (×5): qty 1

## 2019-06-22 MED ORDER — LISINOPRIL-HYDROCHLOROTHIAZIDE 20-25 MG PO TABS: 1.0000 | ORAL_TABLET | Freq: Every day | ORAL | Status: DC

## 2019-06-22 MED ORDER — DIPHENHYDRAMINE HCL 50 MG/ML IJ SOLN
50.0000 mg | Freq: Once | INTRAMUSCULAR | Status: AC
  Administered 2019-06-22: 10:00:00 50 mg via INTRAVENOUS
  Filled 2019-06-22: qty 1

## 2019-06-22 MED ORDER — HYDROCHLOROTHIAZIDE 25 MG PO TABS
25.0000 mg | ORAL_TABLET | Freq: Every day | ORAL | Status: DC
  Administered 2019-06-23 – 2019-06-27 (×5): 25 mg via ORAL
  Filled 2019-06-22 (×6): qty 1

## 2019-06-22 MED ORDER — SODIUM CHLORIDE 0.9 % IV BOLUS
1000.0000 mL | Freq: Once | INTRAVENOUS | Status: AC
  Administered 2019-06-22: 1000 mL via INTRAVENOUS

## 2019-06-22 MED ORDER — LISINOPRIL 20 MG PO TABS
20.0000 mg | ORAL_TABLET | Freq: Every day | ORAL | Status: DC
  Administered 2019-06-22 – 2019-06-27 (×6): 20 mg via ORAL
  Filled 2019-06-22 (×6): qty 1

## 2019-06-22 MED ORDER — TAMSULOSIN HCL 0.4 MG PO CAPS
0.4000 mg | ORAL_CAPSULE | Freq: Every day | ORAL | Status: DC
  Administered 2019-06-23 – 2019-06-26 (×4): 0.4 mg via ORAL
  Filled 2019-06-22 (×4): qty 1

## 2019-06-22 MED ORDER — GADOBUTROL 1 MMOL/ML IV SOLN
10.0000 mL | Freq: Once | INTRAVENOUS | Status: AC | PRN
  Administered 2019-06-22: 10 mL via INTRAVENOUS

## 2019-06-22 MED ORDER — INSULIN ASPART 100 UNIT/ML ~~LOC~~ SOLN
0.0000 [IU] | Freq: Three times a day (TID) | SUBCUTANEOUS | Status: DC
  Administered 2019-06-23 (×3): 5 [IU] via SUBCUTANEOUS
  Administered 2019-06-24: 11 [IU] via SUBCUTANEOUS
  Administered 2019-06-24: 5 [IU] via SUBCUTANEOUS
  Administered 2019-06-24: 15 [IU] via SUBCUTANEOUS
  Administered 2019-06-25 (×3): 8 [IU] via SUBCUTANEOUS
  Administered 2019-06-26: 11 [IU] via SUBCUTANEOUS
  Administered 2019-06-26: 5 [IU] via SUBCUTANEOUS
  Administered 2019-06-26: 13:00:00 3 [IU] via SUBCUTANEOUS
  Administered 2019-06-27: 5 [IU] via SUBCUTANEOUS

## 2019-06-22 NOTE — ED Provider Notes (Signed)
[facility] EMERGENCY DEPARTMENT Provider Note   CSN: [id number] Arrival date & time: 06/22/19  0745     History Chief Complaint  Patient presents with  . Altered Mental Status    Luke Edwards is a 79 y.o. male.  The history is provided by the patient, a relative and medical records. No language interpreter was used.  Altered Mental Status    79 year old male with history of diabetes, GERD, hypertension, previously diagnosed with COVID-19 in January presenting for evaluation of involuntary movement.  For the past 2 days patient has had progressive worsening involuntary movement involving his body most significantly on the right side.  He is having increased fidgety and jittery and is more talkative than usual according to his daughter who is at bedside.  He does not complain of any active pain no headache no vision changes no trouble swallowing neck pain chest pain trouble breathing abdominal pain back pain or dysuria.  He denies any confusion.  No recent medication changes.  He normally walks with a cane.  No history of Parkinson disease.  No urinary symptoms.  No complaints of depression.  Past Medical History:  Diagnosis Date  . COVID-19   . Diabetes mellitus without complication ([place])   . GERD (gastroesophageal reflux disease)   . HLD (hyperlipidemia)   . Hypertension     Patient Active Problem List   Diagnosis Date Noted  . HTN (hypertension) 05/01/2019  . HLD (hyperlipidemia) 05/01/2019  . Acute respiratory failure with hypoxia ([place]) 05/01/2019  . COVID-19 virus infection 04/29/2019  . Acute respiratory distress syndrome (ARDS) due to COVID-19 virus ([place]) 04/29/2019  . Chest pain 03/05/2017    Past Surgical History:  Procedure Laterality Date  . CHOLECYSTECTOMY         Family History  Problem Relation Age of Onset  . Valvular heart disease Mother   . Heart attack Father        27's    Social History   Tobacco Use  . Smoking status:  Never Smoker  . Smokeless tobacco: Never Used  Substance Use Topics  . Alcohol use: Yes    Alcohol/week: 2.0 standard drinks    Types: 2 Cans of beer per week    Comment: Once during the week   . Drug use: No    Home Medications Prior to Admission medications   Medication Sig Start Date End Date Taking? Authorizing Provider  albuterol (VENTOLIN HFA) 108 (90 Base) MCG/ACT inhaler Inhale 2 puffs into the lungs every 4 (four) hours as needed for wheezing or shortness of breath. 05/03/19   Amin, Jeanella Flattery, MD  aspirin 81 MG chewable tablet Chew 1 tablet (81 mg total) by mouth daily. 03/07/17   Welford Roche, MD  cholecalciferol (VITAMIN D3) 25 MCG (1000 UNIT) tablet Take 1,000 Units by mouth daily.    [provider]  famotidine (PEPCID) 20 MG tablet Take 20 mg by mouth daily. 03/04/19   [provider]  finasteride (PROSCAR) 5 MG tablet Take 5 mg by mouth every evening.  12/11/18   [provider]  glipiZIDE (GLUCOTROL XL) 10 MG 24 hr tablet Take 10 mg by mouth daily.  03/10/17   [provider]  insulin regular (NOVOLIN R) 100 units/mL injection Inject 2-10 Units into the skin See admin instructions. Inject per sliding scale three times daily: 150-200=2u, 201-250=4u, 251-300=6u, 301-350=8u, 351-400=10    [provider]  ipratropium (ATROVENT HFA) 17 MCG/ACT inhaler Inhale 1 puff into the lungs  every 8 (eight) hours. 05/03/19 05/02/20  Amin, Jeanella Flattery, MD  lisinopril-hydrochlorothiazide (PRINZIDE,ZESTORETIC) 20-25 MG per tablet Take 1 tablet by mouth daily.    [provider]  metFORMIN (GLUCOPHAGE) 1000 MG tablet Take 1,000 mg by mouth 2 (two) times daily.  02/26/17   [provider]  Multiple Vitamin (MULTIVITAMIN WITH MINERALS) TABS tablet Take 1 tablet by mouth daily.    [provider]  Multiple Vitamins-Minerals (ZINC PO) Take 1 tablet by mouth daily.    [provider]  promethazine (PHENERGAN) 25  MG tablet Take 25 mg by mouth 2 (two) times daily as needed for nausea. 04/24/19   [provider]  simvastatin (ZOCOR) 40 MG tablet Take 40 mg by mouth at bedtime. 04/25/19   [provider]  sulfamethoxazole-trimethoprim (BACTRIM DS) 800-160 MG tablet Take 1 tablet by mouth 2 (two) times daily. 05/06/19   [provider]  tamsulosin (FLOMAX) 0.4 MG CAPS capsule Take 0.4 mg by mouth daily after supper.  02/20/19   [provider]  vitamin B-12 (CYANOCOBALAMIN) 500 MCG tablet Take 500 mcg by mouth daily.    [provider]    Allergies    Patient has no known allergies.  Review of Systems   Review of Systems  All other systems reviewed and are negative.   Physical Exam Updated Vital Signs BP 137/83 (BP Location: Right Arm)   Pulse (!) 103   Temp 97.8 F (36.6 C) (Oral)   Resp 16   SpO2 100%   Physical Exam Vitals and nursing note reviewed.  Constitutional:      General: He is not in acute distress.    Appearance: He is well-developed.  HENT:     Head: Atraumatic.  Eyes:     General: No visual field deficit.    Conjunctiva/sclera: Conjunctivae normal.  Musculoskeletal:     Cervical back: Neck supple.  Skin:    Findings: No rash.  Neurological:     Mental Status: He is alert and oriented to person, place, and time.     GCS: GCS eye subscore is 4. GCS verbal subscore is 5. GCS motor subscore is 6.     Cranial Nerves: Cranial nerves are intact. No dysarthria or facial asymmetry.     Sensory: Sensation is intact.     Comments: During exam, patient exhibits dystonic reaction including rhythmic pattern twisting motion no significant on his right upper and lower extremity which improves with intentional movement.  No facial involvement.  5/5 strength to all 4 extremities Walks with a cane Normal finger to nose, feel to shin     ED Results / Procedures / Treatments   Labs (all labs ordered are listed, but only abnormal results are  displayed) Labs Reviewed  COMPREHENSIVE METABOLIC PANEL - Abnormal; Notable for the following components:      Result Value   Sodium 124 (*)    Chloride 88 (*)    Glucose, Bld 133 (*)    Calcium 8.7 (*)    Total Protein 6.2 (*)    Albumin 3.4 (*)    All other components within normal limits  CBC WITH DIFFERENTIAL/PLATELET - Abnormal; Notable for the following components:   RBC 3.69 (*)    Hemoglobin 11.7 (*)    HCT 33.1 (*)    All other components within normal limits  URINALYSIS, ROUTINE W REFLEX MICROSCOPIC - Abnormal; Notable for the following components:   Glucose, UA 50 (*)    All other components within  normal limits  RAPID URINE DRUG SCREEN, HOSP PERFORMED - Abnormal; Notable for the following components:   Benzodiazepines POSITIVE (*)    All other components within normal limits  SEDIMENTATION RATE - Abnormal; Notable for the following components:   Sed Rate 20 (*)    All other components within normal limits  CSF CULTURE  ETHANOL  CSF CELL COUNT WITH DIFFERENTIAL  CSF CELL COUNT WITH DIFFERENTIAL  GLUCOSE, CSF  PROTEIN, CSF  VDRL, CSF    EKG EKG Interpretation  Date/Time:  Saturday June 22 2019 11:15:51 EST Ventricular Rate:  85 PR Interval:    QRS Duration: 103 QT Interval:  410 QTC Calculation: 488 R Axis:   -18 Text Interpretation: Sinus rhythm Borderline left axis deviation Abnormal R-wave progression, early transition Borderline prolonged QT interval When compared to prior, less PVC. No STEMI Confirmed by Antony Blackbird [phone]) on 06/22/2019 11:28:42 AM   Radiology CT Head Wo Contrast  Result Date: 06/22/2019 CLINICAL DATA:  Involuntary spasm/contraction of right arm and leg. Began 2 days ago. EXAM: CT HEAD WITHOUT CONTRAST TECHNIQUE: Contiguous axial images were obtained from the base of the skull through the vertex without intravenous contrast. COMPARISON:  Brain MRI, 08/09/2013 FINDINGS: Brain: No evidence of acute infarction, hemorrhage,  hydrocephalus, extra-axial collection or mass lesion/mass effect. Left thalamic lacunar infarct, old. Mild bilateral white matter hypoattenuation consistent with chronic microvascular ischemic change. Vascular: No hyperdense vessel or unexpected calcification. Skull: Normal. Negative for fracture or focal lesion. Sinuses/Orbits: Globes and orbits are unremarkable. Left maxillary sinus mucous retention cyst. Sinuses otherwise clear. Other: None. IMPRESSION: 1. No acute intracranial abnormalities. 2. Old left thalamic lacunar infarct. Mild chronic microvascular ischemic change. Electronically Signed   By: Lajean Manes M.D.   On: 06/22/2019 08:52    Procedures .Lumbar Puncture  Date/Time: 06/22/2019 2:58 PM Performed by: Domenic Moras, PA-C Authorized by: Domenic Moras, PA-C   Consent:    Consent obtained:  Written   Consent given by:  Patient   Risks discussed:  Bleeding, pain, repeat procedure, nerve damage and infection   Alternatives discussed:  Delayed treatment and referral Pre-procedure details:    Procedure purpose:  Diagnostic   Preparation: Patient was prepped and draped in usual sterile fashion   Anesthesia (see MAR for exact dosages):    Anesthesia method:  Local infiltration   Local anesthetic:  Lidocaine 2% w/o epi Procedure details:    Lumbar space:  L3-L4 interspace   Patient position:  Sitting   Needle gauge:  24   Needle type:  Spinal needle - Quincke tip   Needle length (in):  3.5   Ultrasound guidance: no     Number of attempts:  2 Comments:     Unsuccessful LP after 2 attempts   (including critical care time)  Medications Ordered in ED Medications  diphenhydrAMINE (BENADRYL) injection 50 mg (50 mg Intravenous Given 06/22/19 0940)  sodium chloride 0.9 % bolus 1,000 mL (0 mLs Intravenous Stopped 06/22/19 1152)  gadobutrol (GADAVIST) 1 MMOL/ML injection 10 mL (10 mLs Intravenous Contrast Given 06/22/19 1239)  lidocaine (XYLOCAINE) 2 % (with pres) injection 200 mg (200  mg Intradermal Given 06/22/19 1511)    ED Course  I have reviewed the triage vital signs and the nursing notes.  Pertinent labs & imaging results that were available during my care of the patient were reviewed by me and considered in my medical decision making (see chart for details).    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  This is ongoing for the past 2 days.  No recent change in any medication to persist up to date the symptom.  Patient did fell 2 weeks ago and hit his head but without any significant injury from the fall.  I feel it is unrelated to today's complaint.  Patient is not on any antipsychotic, or SSRI medication may contribute to this.  He reportedly rarely ever take Phenergan although it is on his list of medication.  He has had normal eating habits.  He did mention his blood sugar has been fluctuating wildly.  Care discussed with Dr. Sherry Ruffing   9:04 AM Appreciate consultation from neurologist Dr. Cheral Marker who agrees to evaluate pt in the ER.   Subthalamic nuclei infarct?  11:26 AM neurologist has seen and evaluated patient and recommend brain MRI with and without contrast for further evaluation of this choreiform movement affecting the right side greater than left.  Currently he is initial head CT scan shows no acute intracranial abnormalities.  Old left thalamic infarct were noted with mild chronic microvascular ischemic changes.  Electrolytes remarkable for low sodium of 124, low chloride of 88 and low calcium of 8.7.  2:18 PM Discussed finding with neurologist who now recommend obtaining an LP for further evaluation.  I discussed care with patient  and he agrees for LP.  Will have informed consent signed.  3:51 PM 2 attempts for LP without success.  Appreciate consultation from radiology who will perform fluoroscopy guidance LP.  Patient signed out to oncoming team who will follow up on CSF results and will consult neurology for further care.   Domenic Moras, PA-C 06/22/19 1552    Tegeler, Gwenyth Allegra, MD 06/23/19 1700

## 2019-06-22 NOTE — H&P (Signed)
History and Physical    Luke Edwards IHK:[id number] DOB: 1940/08/31 DOA: 06/22/2019  PCP: Bonnita Nasuti, MD  Patient coming from: Home, lives with wife and stepdaughter  I have personally briefly reviewed patient's old medical records in [place]  Chief Complaint: Choreiform movements  HPI: Luke Edwards is a 79 y.o. male with medical history significant for hypertension, hyperlipidemia, insulin-dependent type 2 diabetes, recent COVID pneumonia (positive on 1/18) requiring hospitalization who presents with concerns of acute onset of Chorea like movements.   He had abrupt onset of choreiform movements of the right upper and lower extremity starting 2 days ago.  Wife also told him that he seems to be "acting funny" and was having rapid speech.  He denies any family or personal history of anything similar.  Denies any trauma.  He denies any tobacco or illicit drug use.  Has occasional alcohol use every Wednesday.  He reports about a 30 pound weight loss but that was due to him being hospitalized for Covid.  Denies any night sweats.  He denies any nausea, vomiting or diarrhea.  No changes in his bowel movement and just had one this morning.  He has had low appetite but thinks it is due to his recent Covid infection.  He uses a sliding scale for his insulin and lately has been "up and down" and sometimes it goes as high as 420 and other times down to 96.  Patient is mostly retired but works part-time at [employer].  ED Course:  He was afebrile and normotensive on room air.  CBC showed no leukocytosis but had anemia of 11.7 appears to be chronic.  Sodium of 124, glucose of 133, creatinine 1.02. ESR of 20.   CT head showed old left thalamic lacunar infarct but no acute changes. MRI of the brain showed no acute findings. LP was done recommended by neurology which again had noted acute findings.  Neurology recommends further admission with CT of the chest/abdomen/pelvis to rule out  possible paraneoplastic syndrome.  Review of Systems:  Constitutional: No Weight Change, No Fever ENT/Mouth: No sore throat, No Rhinorrhea Eyes: No Eye Pain, No Vision Changes Cardiovascular: No Chest Pain, no SOB, No PND, No Dyspnea on Exertion, No Orthopnea, No Claudication, No Edema, No Palpitations Respiratory: No Cough, No Sputum, No Wheezing, no Dyspnea  Gastrointestinal: No Nausea, No Vomiting, No Diarrhea, No Constipation, No Pain Genitourinary: no Urinary Incontinence, No Urgency, No Flank Pain Musculoskeletal: No Arthralgias, No Myalgias Skin: No Skin Lesions, No Pruritus, Neuro: no Weakness, No Numbness,  No Loss of Consciousness, No Syncope Psych: No Anxiety/Panic, No Depression, no decrease appetite Heme/Lymph: No Bruising, No Bleeding  Past Medical History:  Diagnosis Date   COVID-19    Diabetes mellitus without complication (HCC)    GERD (gastroesophageal reflux disease)    HLD (hyperlipidemia)    Hypertension     Past Surgical History:  Procedure Laterality Date   CHOLECYSTECTOMY       reports that he has never smoked. He has never used smokeless tobacco. He reports current alcohol use of about 2.0 standard drinks of alcohol per week. He reports that he does not use drugs.  No Known Allergies  Family History  Problem Relation Age of Onset   Valvular heart disease Mother    Heart attack Father        42's     Prior to Admission medications   Medication Sig Start Date End Date Taking? Authorizing Provider  acetaminophen (TYLENOL) 650  MG CR tablet Take 1,300 mg by mouth daily as needed for pain.   Yes [provider]  aspirin EC 81 MG tablet Take 81 mg by mouth daily.   Yes [provider]  Black Elderberry (SAMBUCUS ELDERBERRY PO) Take 1 capsule by mouth daily.   Yes [provider]  Cyanocobalamin (VITAMIN B-12) 2500 MCG SUBL Place 2,500 mcg under the tongue daily.   Yes [provider]  famotidine (PEPCID) 20  MG tablet Take 20 mg by mouth daily. 03/04/19  Yes [provider]  finasteride (PROSCAR) 5 MG tablet Take 5 mg by mouth at bedtime.  9/1/.  Yes [provider]  glipiZIDE (GLUCOTROL XL) 10 MG 24 hr tablet Take 10 mg by mouth daily.  03/10/17  Yes [provider]  insulin regular (NOVOLIN R) 100 units/mL injection Inject 2-10 Units into the skin See admin instructions. Inject 2-10 units per sliding scale three times daily before meals: 150-200=2u, 201-250=4u, 251-300=6u, 301-350=8u, 351-400=10   Yes [provider]  lisinopril-hydrochlorothiazide (PRINZIDE,ZESTORETIC) 20-25 MG per tablet Take 1 tablet by mouth daily.   Yes [provider]  metFORMIN (GLUCOPHAGE) 1000 MG tablet Take 1,000 mg by mouth 2 (two) times daily.  02/26/17  Yes [provider]  Multiple Vitamin (MULTIVITAMIN WITH MINERALS) TABS tablet Take 1 tablet by mouth daily.   Yes [provider]  promethazine (PHENERGAN) 25 MG tablet Take 25 mg by mouth 2 (two) times daily as needed for nausea. 04/24/19  Yes [provider]  tamsulosin (FLOMAX) 0.4 MG CAPS capsule Take 0.4 mg by mouth daily after supper.  02/20/19  Yes [provider]  albuterol (VENTOLIN HFA) 108 (90 Base) MCG/ACT inhaler Inhale 2 puffs into the lungs every 4 (four) hours as needed for wheezing or shortness of breath. Patient not taking: Reported on 06/22/2019 05/03/19   Damita Lack, MD  aspirin 81 MG chewable tablet Chew 1 tablet (81 mg total) by mouth daily. Patient not taking: Reported on 06/22/2019 03/07/17   Welford Roche, MD  ipratropium (ATROVENT HFA) 17 MCG/ACT inhaler Inhale 1 puff into the lungs every 8 (eight) hours. Patient not taking: Reported on 06/22/2019 05/03/19 05/02/20  Damita Lack, MD    Physical Exam: Vitals:   06/22/19 1530 06/22/19 1545 06/22/19 1600 06/22/19 1615  BP: 140/82  132/86   Pulse: 84 83 82 (!) 133  Resp: (!) 21 19 16  (!) 28  Temp:       TempSrc:      SpO2: 97% 95% 95% 100%    Constitutional: NAD, calm, comfortable, laying flat in bed Vitals:   06/22/19 1530 06/22/19 1545 06/22/19 1600 06/22/19 1615  BP: 140/82  132/86   Pulse: 84 83 82 (!) 133  Resp: (!) 21 19 16  (!) 28  Temp:      TempSrc:      SpO2: 97% 95% 95% 100%   Eyes: PERRL, lids and conjunctivae normal ENMT: Mucous membranes are moist. Posterior pharynx clear of any exudate or lesions.Normal dentition.  Neck: normal, supple Respiratory: clear to auscultation bilaterally, no wheezing, no crackles. Normal respiratory effort on room air. No accessory muscle use.  Cardiovascular: Regular rate and rhythm, no murmurs / rubs / gallops. No extremity edema. Abdomen: no tenderness, no masses palpated.  Bowel sounds positive.  Musculoskeletal: no clubbing / cyanosis. No joint deformity upper and lower extremities. Good ROM, no contractures. Normal muscle tone.  Skin: no rashes, lesions, ulcers. No induration Neurologic: CN 2-12 grossly intact.  Sensation intact. Strength 5/5 in all 4. Chorea/threshing movement of the right upper and lower extremity although this significant decreases with intentional movements.  Psychiatric: Normal judgment and insight. Alert and oriented x 3. Normal mood. Pt was talkative and frequently telling jokes.    Labs on Admission: I have personally reviewed following labs and imaging studies  CBC: Recent Labs  Lab 06/22/19 0929  WBC 9.1  NEUTROABS 5.6  HGB 11.7*  HCT 33.1*  MCV 89.7  PLT 008   Basic Metabolic Panel: Recent Labs  Lab 06/22/19 0929  NA 124*  K 3.7  CL 88*  CO2 23  GLUCOSE 133*  BUN 9  CREATININE 1.02  CALCIUM 8.7*   GFR: CrCl cannot be calculated (Unknown ideal weight.). Liver Function Tests: Recent Labs  Lab 06/22/19 0929  AST 21  ALT 17  ALKPHOS 55  BILITOT 0.8  PROT 6.2*  ALBUMIN 3.4*   No results for input(s): LIPASE, AMYLASE in the last 168 hours. No results for input(s): AMMONIA in  the last 168 hours. Coagulation Profile: No results for input(s): INR, PROTIME in the last 168 hours. Cardiac Enzymes: No results for input(s): CKTOTAL, CKMB, CKMBINDEX, TROPONINI in the last 168 hours. BNP (last 3 results) No results for input(s): PROBNP in the last 8760 hours. HbA1C: No results for input(s): HGBA1C in the last 72 hours. CBG: No results for input(s): GLUCAP in the last 168 hours. Lipid Profile: No results for input(s): CHOL, HDL, LDLCALC, TRIG, CHOLHDL, LDLDIRECT in the last 72 hours. Thyroid Function Tests: No results for input(s): TSH, T4TOTAL, FREET4, T3FREE, THYROIDAB in the last 72 hours. Anemia Panel: No results for input(s): VITAMINB12, FOLATE, FERRITIN, TIBC, IRON, RETICCTPCT in the last 72 hours. Urine analysis:    Component Value Date/Time   COLORURINE YELLOW 06/22/2019 0928   APPEARANCEUR CLEAR 06/22/2019 0928   LABSPEC 1.010 06/22/2019 0928   PHURINE 6.0 06/22/2019 0928   GLUCOSEU 50 (A) 06/22/2019 0928   HGBUR NEGATIVE 06/22/2019 0928   BILIRUBINUR NEGATIVE 06/22/2019 0928   KETONESUR NEGATIVE 06/22/2019 0928   PROTEINUR NEGATIVE 06/22/2019 0928   UROBILINOGEN 0.2 08/09/2013 1150   NITRITE NEGATIVE 06/22/2019 0928   LEUKOCYTESUR NEGATIVE 06/22/2019 0928    Radiological Exams on Admission: CT Head Wo Contrast  Result Date: 06/22/2019 CLINICAL DATA:  Involuntary spasm/contraction of right arm and leg. Began 2 days ago. EXAM: CT HEAD WITHOUT CONTRAST TECHNIQUE: Contiguous axial images were obtained from the base of the skull through the vertex without intravenous contrast. COMPARISON:  Brain MRI, 08/09/2013 FINDINGS: Brain: No evidence of acute infarction, hemorrhage, hydrocephalus, extra-axial collection or mass lesion/mass effect. Left thalamic lacunar infarct, old. Mild bilateral white matter hypoattenuation consistent with chronic microvascular ischemic change. Vascular: No hyperdense vessel or unexpected calcification. Skull: Normal. Negative for  fracture or focal lesion. Sinuses/Orbits: Globes and orbits are unremarkable. Left maxillary sinus mucous retention cyst. Sinuses otherwise clear. Other: None. IMPRESSION: 1. No acute intracranial abnormalities. 2. Old left thalamic lacunar infarct. Mild chronic microvascular ischemic change. Electronically Signed   By: Lajean Manes M.D.   On: 06/22/2019 08:52   MR BRAIN W WO CONTRAST  Result Date: 06/22/2019 CLINICAL DATA:  Focal neuro deficit with stroke suspected. EXAM: MRI HEAD WITHOUT AND WITH CONTRAST TECHNIQUE: Multiplanar, multiecho pulse sequences of the brain and surrounding structures were obtained without and with intravenous contrast. CONTRAST:  12m GADAVIST GADOBUTROL 1 MMOL/ML IV SOLN COMPARISON:  Head CT earlier today FINDINGS: Brain: No acute infarction, hemorrhage, hydrocephalus, extra-axial collection or mass lesion.  Cystic intensity in the left thalamus which is very rounded and long-standing. Given the shape and lack of adjacent gliosis suspect this is a dilated perivascular space or neuroglial cyst. Slight enlargement from before is non worrisome. There has been progression of chronic small vessel ischemia in the cerebral white matter and a generalized decrease in cerebral volume. No abnormal intracranial enhancement. Vascular: Normal flow voids and vascular enhancements Skull and upper cervical spine: Normal marrow signal Sinuses/Orbits: Retention cysts in the left maxillary sinus. IMPRESSION: Aging of the brain since 2015 with no acute or reversible finding. Electronically Signed   By: Monte Fantasia M.D.   On: 06/22/2019 12:52   DG FLUORO GUIDE LUMBAR PUNCTURE  Result Date: 06/22/2019 CLINICAL DATA:  79 year old male with choreiform movements. Evaluate CSF. EXAM: DIAGNOSTIC LUMBAR PUNCTURE UNDER FLUOROSCOPIC GUIDANCE FLUOROSCOPY TIME:  Fluoroscopy Time:  0.5 minutes Number of Acquired Spot Images: 1 PROCEDURE: Informed consent was obtained from the patient prior to the procedure,  including potential complications of headache, allergy, and pain. With the patient prone, the lower back was prepped with Betadine. 1% Lidocaine was used for local anesthesia. Lumbar puncture was performed at the L3-4 level using a 20 gauge needle with return of clear colorless CSF with an opening pressure of 16 cm water. Ten ml of CSF were obtained for laboratory studies. The patient tolerated the procedure well and there were no apparent complications. IMPRESSION: Fluoroscopic guided lumbar puncture.  No immediate complication. Electronically Signed   By: Margarette Canada M.D.   On: 06/22/2019 17:33    EKG: Independently reviewed.   Assessment/Plan  Choreiform movements Negative CT head, MRI Brain and LP Neurolog has evaluated and recommends CT chest/abdomen/pelvic to rule out mass and paraneoplastic syndrome  Anti CRMP5 antibody titer and Anti-Hu antibody titer pending to evaluate autoimmune encephalitis Neurology is less concern with seizure  Hyponatremia Na 124- not significant enough to explain choreiform movements stat repeat after 1L NS bolus in the ED   Anemia  Pt was taking sublingual B12- will hold giving new neurological findings and check B12 level  Check Iron panel, folic and FOBT as well  Type 2 diabetes normally on a sliding scale  start moderate SSI   Hypertension  continue Lisinopril-HCTZ  Hx of COVID infection  positive on 1/18 no repeat testing recommend since it is still within 3 months and he is not here for COVID symptoms  DVT prophylaxis:.Lovenox Code Status: Full Family Communication: Plan discussed with patient at bedside  disposition Plan: Home with at least 2 midnight stays  Consults called:  Admission status: inpatient  Geralene Afshar T Amyr Sluder DO [facility]   If 7PM-7AM, please contact night-coverage [email or web address]   06/22/2019, 9:13 PM

## 2019-06-22 NOTE — ED Triage Notes (Addendum)
Family reports pt very talkative and fidgety over the past 2-3 days.  Pt alert and oriented.  Denies pain.  Denies weakness.  No arm drift.  Pt had COVID in January.  Fell in bathroom approx 2 weeks ago and hit his head.  Family concerned pt may have UTI- no history of same.

## 2019-06-22 NOTE — Consult Note (Addendum)
NEURO HOSPITALIST CONSULT NOTE   Requestig physician: Dr. Sherry Ruffing  Reason for Consult: Abnormal movements  History obtained from:  Patient and Daughter HPI:                                                                                                                                          Luke Edwards is an 79 y.o. male with a PMHx of HTN, DM, recent COVID-66 PNA (January 2021) who presented to the [facility] ED for a 2 day history of abnormal movements.  Per patient/family: Abnormal  Involuntary movements of the right arm and leg started about 2 days ago and have increased in intensity over that time period. Patient was + for COVID in January and was hospitalized with Covid PNA. The right arm and leg movements are of a squirming as well as jerking and flinging quality. He has these abnormal involuntary movements even while sleeping. Has some slight movements on the left side, but no where near as prominent as on the right side. The movements improve with active goal-directed motor activity of the right sided limbs, as well as with goal directed movements of the left sided limbs and eyes. Denies any new medications having been started or stopped. No vision changes, fever, CP, SOB or weakness.  ED course: CTH: Old left thalamic hypodensity most consistent with an incidental neuroglial cyst BG: 123  BP: 137/85 UDs: + benzo's  Past Medical History:  Diagnosis Date  . COVID-19   . Diabetes mellitus without complication ([place])   . GERD (gastroesophageal reflux disease)   . HLD (hyperlipidemia)   . Hypertension     Past Surgical History:  Procedure Laterality Date  . CHOLECYSTECTOMY      Family History  Problem Relation Age of Onset  . Valvular heart disease Mother   . Heart attack Father        78's          Social History:  reports that he has never smoked. He has never used smokeless tobacco. He reports current alcohol use of about 2.0 standard drinks of  alcohol per week. He reports that he does not use drugs.  No Known Allergies  MEDICATIONS:  Current Facility-Administered Medications  Medication Dose Route Frequency Provider Last Rate Last Admin  . sodium chloride 0.9 % bolus 1,000 mL  1,000 mL Intravenous Once Domenic Moras, PA-C       Current Outpatient Medications  Medication Sig Dispense Refill  . albuterol (VENTOLIN HFA) 108 (90 Base) MCG/ACT inhaler Inhale 2 puffs into the lungs every 4 (four) hours as needed for wheezing or shortness of breath. 18 g 0  . aspirin 81 MG chewable tablet Chew 1 tablet (81 mg total) by mouth daily. 30 tablet 2  . cholecalciferol (VITAMIN D3) 25 MCG (1000 UNIT) tablet Take 1,000 Units by mouth daily.    . famotidine (PEPCID) 20 MG tablet Take 20 mg by mouth daily.    . finasteride (PROSCAR) 5 MG tablet Take 5 mg by mouth every evening.     Marland Kitchen glipiZIDE (GLUCOTROL XL) 10 MG 24 hr tablet Take 10 mg by mouth daily.     . insulin regular (NOVOLIN R) 100 units/mL injection Inject 2-10 Units into the skin See admin instructions. Inject per sliding scale three times daily: 150-200=2u, 201-250=4u, 251-300=6u, 301-350=8u, 351-400=10    . ipratropium (ATROVENT HFA) 17 MCG/ACT inhaler Inhale 1 puff into the lungs every 8 (eight) hours. 1 Inhaler 0  . lisinopril-hydrochlorothiazide (PRINZIDE,ZESTORETIC) 20-25 MG per tablet Take 1 tablet by mouth daily.    . metFORMIN (GLUCOPHAGE) 1000 MG tablet Take 1,000 mg by mouth 2 (two) times daily.     . Multiple Vitamin (MULTIVITAMIN WITH MINERALS) TABS tablet Take 1 tablet by mouth daily.    . Multiple Vitamins-Minerals (ZINC PO) Take 1 tablet by mouth daily.    . promethazine (PHENERGAN) 25 MG tablet Take 25 mg by mouth 2 (two) times daily as needed for nausea.    . simvastatin (ZOCOR) 40 MG tablet Take 40 mg by mouth at bedtime.    . sulfamethoxazole-trimethoprim  (BACTRIM DS) 800-160 MG tablet Take 1 tablet by mouth 2 (two) times daily.    . tamsulosin (FLOMAX) 0.4 MG CAPS capsule Take 0.4 mg by mouth daily after supper.     . vitamin B-12 (CYANOCOBALAMIN) 500 MCG tablet Take 500 mcg by mouth daily.      ROS:                                                                                                                                       ROS was performed and is negative except as noted in HPI   Blood pressure 137/83, pulse (!) 103, temperature 97.8 F (36.6 C), temperature source Oral, resp. rate 16, SpO2 100 %.   General Examination:  Physical Exam  HEENT-  Normocephalic, no lesions, without obvious abnormality.  Normal external eye and conjunctiva.   Cardiovascular- S1-S2 audible, pulses palpable throughout   Lungs-no rhonchi or wheezing noted, no excessive working breathing.  Saturations within normal limits Abdomen- All 4 quadrants palpated and nontender Extremities- Warm, dry and intact Musculoskeletal-no joint tenderness, deformity or swelling Skin-warm and dry, no hyperpigmentation, vitiligo, or suspicious lesions  Neurological Examination Mental Status: Alert, oriented, thought content appropriate.  Speech fluent without evidence of aphasia.  Able to follow 3 step commands without difficulty. Cranial Nerves: II: VFF, PERRL III,IV, VI: ptosis not present, EOMI  V,VII: smile symmetric, facial light touch sensation normal bilaterally VIII: hearing normal bilaterally IX,X: uvula rises symmetrically XI: bilateral shoulder shrug XII: midline tongue extension Motor: Right : Upper extremity   5/5 Left:     Upper extremity   5/5  Lower extremity   5/5  Lower extremity   5/5 Choreiform  movements of the right arm and leg. When focused on goal-directed movements or either the right-sided or left-sided limbs, as well as during visual  tracking, the movements either stop or significantly decrease in amplitude Sensory: Cool temp and light touch intact throughout, bilaterally Deep Tendon Reflexes: 2+ and symmetric throughout Plantars: Right: downgoing   Left: upgoing Cerebellar: No action tremor. Some dysmetria noted with FNF bilaterally. Gait: Deferred Other: No jerking, twitching or other seizure-like movements noted   Lab Results: Basic Metabolic Panel: Recent Labs  Lab 06/22/19 0929  NA 124*  K 3.7  CL 88*  CO2 23  GLUCOSE 133*  BUN 9  CREATININE 1.02  CALCIUM 8.7*    CBC: Recent Labs  Lab 06/22/19 0929  WBC 9.1  NEUTROABS 5.6  HGB 11.7*  HCT 33.1*  MCV 89.7  PLT 270    Imaging: CT Head Wo Contrast  Result Date: 06/22/2019 CLINICAL DATA:  Involuntary spasm/contraction of right arm and leg. Began 2 days ago. EXAM: CT HEAD WITHOUT CONTRAST TECHNIQUE: Contiguous axial images were obtained from the base of the skull through the vertex without intravenous contrast. COMPARISON:  Brain MRI, 08/09/2013 FINDINGS: Brain: No evidence of acute infarction, hemorrhage, hydrocephalus, extra-axial collection or mass lesion/mass effect. Left thalamic lacunar infarct, old. Mild bilateral white matter hypoattenuation consistent with chronic microvascular ischemic change. Vascular: No hyperdense vessel or unexpected calcification. Skull: Normal. Negative for fracture or focal lesion. Sinuses/Orbits: Globes and orbits are unremarkable. Left maxillary sinus mucous retention cyst. Sinuses otherwise clear. Other: None. IMPRESSION: 1. No acute intracranial abnormalities. 2. Old left thalamic lacunar infarct. Mild chronic microvascular ischemic change. Electronically Signed   By: Lajean Manes M.D.   On: 06/22/2019 08:52   Laurey Morale, MSN, NP-C [facility] [phone]  MRI brain with and without contrast: No acute infarction, hemorrhage, hydrocephalus, extra-axial collection or mass lesion. Cystic intensity  in the left thalamus which is very rounded and long-standing. Given the shape and lack of adjacent gliosis suspect this is a dilated perivascular space or neuroglial cyst. Slight enlargement from before is non worrisome. There has been progression of chronic small vessel ischemia in the cerebral white matter and a generalized decrease in cerebral volume. No abnormal intracranial enhancement.  Assessment: 79 year old male presenting with choreiform movements 1. On exam patient has choreiform movements on the right side > left side. When patient is distracted/performing movements on the left side the right side abnormal movements slow down significantly and even stop for a few seconds.  2. MRI brain does not reveal  a structural etiology for the patient's presentation. No abnormal basal ganglia signal or enhancement seen to explain his presentation. Anatomical locations of the subthalamic nuclei are normal in appearance.  3. Most likely etiology for his presentation is felt to be autoimmune. 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 Of note, his CRP is elevated at 3, but ESR is only mildly elevated at 20. Hyperglycemia can precipitate chorea, but this would be expected to be bilateral rather than unilateral as is the case with this patient; additionally, although per daughter his CBG at home was recently > 500, it is now nearly normal at 133 with no improvement to his chorea. His low sodium of 124 does not provide an explanation for his presentation. His movements are not consistent with seizure. Renal function labs are normal.  4. Of note, review of the literature on paraneoplastic chorea reveals a study citing that the most frequently associated cancer is small cell lung cancer (SCLC), with lymphoma, bowel, and kidney cancers also reported. CV2/CRMP5 was the most frequently associated antibody, followed by Hu.  Hyperintense lesions of the basal ganglia on T2-weighted images were seldom observed.   Recommendations: 1. LP. Send for WBC, protein, glucose, oligoclonal bands and IgG index 2. Will consider treating with IV Solumedrol 1000 mg qd x 5 days, pending CSF results.  3. CT of chest/abdomen/pelvis to rule out mass (to evaluate possible paraneoplastic syndrome) 4. Anti CRMP5 antibody titer (ordered) 5. Anti-Hu antibody titer (ordered)    Electronically signed: Dr. Kerney Elbe 06/22/2019, 9:06 AM

## 2019-06-22 NOTE — Procedures (Signed)
Lumbar Puncture Procedure Note  Pre-operative Diagnosis:Choreoform movements  Post-operative Diagnosis: as above  Indications: Diagnostic  Procedure Details   Consent: Informed consent was obtained. Risks of the procedure were discussed including: infection, bleeding, pain and headache.  The patient was positioned under sterile conditions. Betadine solution and sterile drapes were utilized. A spinal needle was inserted at the L3-4 interspace.  Spinal fluid was obtained and sent to the laboratory.  Findings 15mL of clear spinal fluid was obtained. Opening Pressure:16 cm H2O pressure. .  Complications:  None; patient tolerated the procedure well.        Condition: stable  Plan Bed rest for 3 hours. Tylenol 650 mg for pain. Call office if you develop a severe headache, nausea, vomiting, or fever greater than 100.5 F.

## 2019-06-22 NOTE — ED Provider Notes (Signed)
Patient is a 79 year old gentleman who presents today for evaluation of abnormal movements on the right side of his body that started 2 days ago.   Physical Exam  BP 132/86   Pulse (!) 133   Temp 97.8 F (36.6 C) (Oral)   Resp (!) 28   SpO2 100%   Physical Exam Vitals and nursing note reviewed.  Constitutional:      General: He is not in acute distress.    Appearance: He is well-developed. He is not diaphoretic.  HENT:     Head: Normocephalic and atraumatic.  Eyes:     General: No scleral icterus.       Right eye: No discharge.        Left eye: No discharge.     Conjunctiva/sclera: Conjunctivae normal.  Cardiovascular:     Rate and Rhythm: Normal rate and regular rhythm.  Pulmonary:     Effort: Pulmonary effort is normal. No respiratory distress.     Breath sounds: No stridor.  Abdominal:     General: There is no distension.     Tenderness: There is no abdominal tenderness.  Musculoskeletal:        General: No deformity.     Cervical back: Normal range of motion.  Skin:    General: Skin is warm and dry.  Neurological:     Mental Status: He is alert.     Motor: No abnormal muscle tone.     Comments: Patient has spontaneous, choreiform-like movements of the right arm and leg.  These diminished with intentional movement such as holding his hands out or lifting his leg. Otherwise he is awake and alert, answers questions appropriately.  Psychiatric:        Behavior: Behavior normal.     ED Course/Procedures     Procedures  Labs Reviewed  COMPREHENSIVE METABOLIC PANEL - Abnormal; Notable for the following components:      Result Value   Sodium 124 (*)    Chloride 88 (*)    Glucose, Bld 133 (*)    Calcium 8.7 (*)    Total Protein 6.2 (*)    Albumin 3.4 (*)    All other components within normal limits  CBC WITH DIFFERENTIAL/PLATELET - Abnormal; Notable for the following components:   RBC 3.69 (*)    Hemoglobin 11.7 (*)    HCT 33.1 (*)    All other components  within normal limits  URINALYSIS, ROUTINE W REFLEX MICROSCOPIC - Abnormal; Notable for the following components:   Glucose, UA 50 (*)    All other components within normal limits  RAPID URINE DRUG SCREEN, HOSP PERFORMED - Abnormal; Notable for the following components:   Benzodiazepines POSITIVE (*)    All other components within normal limits  SEDIMENTATION RATE - Abnormal; Notable for the following components:   Sed Rate 20 (*)    All other components within normal limits  BASIC METABOLIC PANEL - Abnormal; Notable for the following components:   Sodium 128 (*)    Potassium 2.8 (*)    CO2 20 (*)    Glucose, Bld 151 (*)    BUN 7 (*)    Calcium 7.3 (*)    All other components within normal limits  CSF CULTURE  ETHANOL  CSF CELL COUNT WITH DIFFERENTIAL  CSF CELL COUNT WITH DIFFERENTIAL  GLUCOSE, CSF  PROTEIN, CSF  VDRL, CSF  MISC [facility] TEST (SEND OUT)  MISC [facility] TEST (SEND OUT)  BASIC METABOLIC PANEL  CBC  TSH  IRON  AND TIBC  VITAMIN B12  FOLATE RBC  OCCULT BLOOD X 1 CARD TO LAB, STOOL    CT Head Wo Contrast  Result Date: 06/22/2019 CLINICAL DATA:  Involuntary spasm/contraction of right arm and leg. Began 2 days ago. EXAM: CT HEAD WITHOUT CONTRAST TECHNIQUE: Contiguous axial images were obtained from the base of the skull through the vertex without intravenous contrast. COMPARISON:  Brain MRI, 08/09/2013 FINDINGS: Brain: No evidence of acute infarction, hemorrhage, hydrocephalus, extra-axial collection or mass lesion/mass effect. Left thalamic lacunar infarct, old. Mild bilateral white matter hypoattenuation consistent with chronic microvascular ischemic change. Vascular: No hyperdense vessel or unexpected calcification. Skull: Normal. Negative for fracture or focal lesion. Sinuses/Orbits: Globes and orbits are unremarkable. Left maxillary sinus mucous retention cyst. Sinuses otherwise clear. Other: None. IMPRESSION: 1. No acute intracranial abnormalities. 2. Old left  thalamic lacunar infarct. Mild chronic microvascular ischemic change. Electronically Signed   By: Lajean Manes M.D.   On: 06/22/2019 08:52   CT CHEST W CONTRAST  Result Date: 06/22/2019 CLINICAL DATA:  Ataxia. EXAM: CT CHEST, ABDOMEN, AND PELVIS WITH CONTRAST TECHNIQUE: Multidetector CT imaging of the chest, abdomen and pelvis was performed following the standard protocol during bolus administration of intravenous contrast. CONTRAST:  169mL OMNIPAQUE IOHEXOL 300 MG/ML  SOLN COMPARISON:  None. FINDINGS: CT CHEST FINDINGS Cardiovascular: No significant vascular findings. Normal heart size. No pericardial effusion. Mild to moderate severity coronary artery calcification is seen. Mediastinum/Nodes: No enlarged mediastinal, hilar, or axillary lymph nodes. Thyroid gland, trachea, and esophagus demonstrate no significant findings. Lungs/Pleura: Mild atelectasis is seen along the posterior aspect of the left upper lobe and posterior aspects of the bilateral lower lobes. There is no evidence of a pleural effusion or pneumothorax. Musculoskeletal: No chest wall mass or suspicious bone lesions identified. CT ABDOMEN PELVIS FINDINGS Hepatobiliary: No focal liver abnormality is seen. Status post cholecystectomy. No biliary dilatation. Pancreas: Unremarkable. No pancreatic ductal dilatation or surrounding inflammatory changes. Spleen: Normal in size without focal abnormality. Adrenals/Urinary Tract: Adrenal glands are unremarkable. Kidneys are normal, without renal calculi, focal lesion, or hydronephrosis. Bladder is unremarkable. Stomach/Bowel: Stomach is within normal limits. The appendix is not identified. No evidence of bowel wall thickening, distention, or inflammatory changes. Noninflamed diverticula are seen within the descending and sigmoid colon. Vascular/Lymphatic: Mild aortic atherosclerosis. No enlarged abdominal or pelvic lymph nodes. Reproductive: There is moderate to marked severity prostate gland enlargement.  Other: No abdominal wall hernia or abnormality. No abdominopelvic ascites. Musculoskeletal: No acute or significant osseous findings. IMPRESSION: 1. No evidence of acute abnormalities within the chest, abdomen or pelvis. 2. Mild to moderate severity coronary artery calcification. 3. Noninflamed diverticula of the descending and sigmoid colon. 4. Moderate to marked severity prostate gland enlargement. Please correlate to PSA values. Aortic Atherosclerosis (ICD10-I70.0). Electronically Signed   By: Virgina Norfolk M.D.   On: 06/22/2019 22:20   MR BRAIN W WO CONTRAST  Result Date: 06/22/2019 CLINICAL DATA:  Focal neuro deficit with stroke suspected. EXAM: MRI HEAD WITHOUT AND WITH CONTRAST TECHNIQUE: Multiplanar, multiecho pulse sequences of the brain and surrounding structures were obtained without and with intravenous contrast. CONTRAST:  7mL GADAVIST GADOBUTROL 1 MMOL/ML IV SOLN COMPARISON:  Head CT earlier today FINDINGS: Brain: No acute infarction, hemorrhage, hydrocephalus, extra-axial collection or mass lesion. Cystic intensity in the left thalamus which is very rounded and long-standing. Given the shape and lack of adjacent gliosis suspect this is a dilated perivascular space or neuroglial cyst. Slight enlargement from before is non worrisome. There has been  progression of chronic small vessel ischemia in the cerebral white matter and a generalized decrease in cerebral volume. No abnormal intracranial enhancement. Vascular: Normal flow voids and vascular enhancements Skull and upper cervical spine: Normal marrow signal Sinuses/Orbits: Retention cysts in the left maxillary sinus. IMPRESSION: Aging of the brain since 2015 with no acute or reversible finding. Electronically Signed   By: Monte Fantasia M.D.   On: 06/22/2019 12:52   CT ABDOMEN PELVIS W CONTRAST  Result Date: 06/22/2019 CLINICAL DATA:  Ataxia EXAM: CT CHEST, ABDOMEN, AND PELVIS WITH CONTRAST TECHNIQUE: Multidetector CT imaging of the  chest, abdomen and pelvis was performed following the standard protocol during bolus administration of intravenous contrast. CONTRAST:  195mL OMNIPAQUE IOHEXOL 300 MG/ML  SOLN COMPARISON:  March 23, 2015 FINDINGS: CT CHEST FINDINGS Cardiovascular: No significant vascular findings. Normal heart size. No pericardial effusion. Mild to moderate severity coronary artery calcification is seen. Mediastinum/Nodes: No enlarged mediastinal, hilar, or axillary lymph nodes. Thyroid gland, trachea, and esophagus demonstrate no significant findings. Lungs/Pleura: Mild atelectasis is seen along the posterior aspect of the left upper lobe and posterior aspects of the bilateral lower lobes. There is no evidence of a pleural effusion or pneumothorax. Musculoskeletal: No chest wall mass or suspicious bone lesions identified. CT ABDOMEN PELVIS FINDINGS Hepatobiliary: No focal liver abnormality is seen. Status post cholecystectomy. No biliary dilatation. Pancreas: Unremarkable. No pancreatic ductal dilatation or surrounding inflammatory changes. Spleen: Normal in size without focal abnormality. Adrenals/Urinary Tract: Adrenal glands are unremarkable. Kidneys are normal, without renal calculi, focal lesion, or hydronephrosis. Bladder is unremarkable. Stomach/Bowel: Stomach is within normal limits. The appendix is not identified. No evidence of bowel wall thickening, distention, or inflammatory changes. Noninflamed diverticula are seen within the descending and sigmoid colon. Vascular/Lymphatic: Mild aortic atherosclerosis. No enlarged abdominal or pelvic lymph nodes. Reproductive: There is moderate to marked severity prostate gland enlargement. Other: No abdominal wall hernia or abnormality. No abdominopelvic ascites. Musculoskeletal: No acute or significant osseous findings. IMPRESSION: 1. No evidence of acute abnormalities within the chest, abdomen or pelvis. 2. Mild to moderate severity coronary artery calcification. 3. Noninflamed  diverticula of the descending and sigmoid colon. 4. Moderate to marked severity prostate gland enlargement. Please correlate to PSA values. Aortic Atherosclerosis (ICD10-I70.0). Electronically Signed   By: Virgina Norfolk M.D.   On: 06/22/2019 22:18   DG FLUORO GUIDE LUMBAR PUNCTURE  Result Date: 06/22/2019 CLINICAL DATA:  79 year old male with choreiform movements. Evaluate CSF. EXAM: DIAGNOSTIC LUMBAR PUNCTURE UNDER FLUOROSCOPIC GUIDANCE FLUOROSCOPY TIME:  Fluoroscopy Time:  0.5 minutes Number of Acquired Spot Images: 1 PROCEDURE: Informed consent was obtained from the patient prior to the procedure, including potential complications of headache, allergy, and pain. With the patient prone, the lower back was prepped with Betadine. 1% Lidocaine was used for local anesthesia. Lumbar puncture was performed at the L3-4 level using a 20 gauge needle with return of clear colorless CSF with an opening pressure of 16 cm water. Ten ml of CSF were obtained for laboratory studies. The patient tolerated the procedure well and there were no apparent complications. IMPRESSION: Fluoroscopic guided lumbar puncture.  No immediate complication. Electronically Signed   By: Margarette Canada M.D.   On: 06/22/2019 17:33     MDM  Plan is to follow-up on LP results, call neuro when results return. LP results returned and reviewed. I spoke with Dr. Malen Gauze of neurology who recommends admission, Dr. Cheral Marker will see patient tomorrow.  I spoke with hospitalist who will see patient for admission.  Note: Portions of this report may have been transcribed using voice recognition software. Every effort was made to ensure accuracy; however, inadvertent computerized transcription errors may be present         Lorin Glass, PA-C 06/23/19 [id number]    Blanchie Dessert, MD 06/24/19 2049

## 2019-06-22 NOTE — ED Notes (Signed)
Pt transported to CT ?

## 2019-06-22 NOTE — ED Notes (Signed)
Pt was transported to IR for LP.

## 2019-06-23 LAB — CBC
HCT: 32.4 % — ABNORMAL LOW (ref 39.0–52.0)
Hemoglobin: 11.5 g/dL — ABNORMAL LOW (ref 13.0–17.0)
MCH: 31.9 pg (ref 26.0–34.0)
MCHC: 35.5 g/dL (ref 30.0–36.0)
MCV: 89.8 fL (ref 80.0–100.0)
Platelets: 294 10*3/uL (ref 150–400)
RBC: 3.61 MIL/uL — ABNORMAL LOW (ref 4.22–5.81)
RDW: 13.5 % (ref 11.5–15.5)
WBC: 8.3 10*3/uL (ref 4.0–10.5)
nRBC: 0 % (ref 0.0–0.2)

## 2019-06-23 LAB — IRON AND TIBC
Iron: 42 ug/dL — ABNORMAL LOW (ref 45–182)
Saturation Ratios: 12 % — ABNORMAL LOW (ref 17.9–39.5)
TIBC: 337 ug/dL (ref 250–450)
UIBC: 295 ug/dL

## 2019-06-23 LAB — BASIC METABOLIC PANEL
Anion gap: 10 (ref 5–15)
Anion gap: 12 (ref 5–15)
BUN: 7 mg/dL — ABNORMAL LOW (ref 8–23)
BUN: 8 mg/dL (ref 8–23)
CO2: 20 mmol/L — ABNORMAL LOW (ref 22–32)
CO2: 25 mmol/L (ref 22–32)
Calcium: 7.3 mg/dL — ABNORMAL LOW (ref 8.9–10.3)
Calcium: 8.8 mg/dL — ABNORMAL LOW (ref 8.9–10.3)
Chloride: 92 mmol/L — ABNORMAL LOW (ref 98–111)
Chloride: 98 mmol/L (ref 98–111)
Creatinine, Ser: 0.75 mg/dL (ref 0.61–1.24)
Creatinine, Ser: 1.07 mg/dL (ref 0.61–1.24)
GFR calc Af Amer: >60 mL/min (ref >60)
GFR calc Af Amer: >60 mL/min (ref >60)
GFR calc non Af Amer: >60 mL/min (ref >60)
GFR calc non Af Amer: >60 mL/min (ref >60)
Glucose, Bld: 151 mg/dL — ABNORMAL HIGH (ref 70–99)
Glucose, Bld: 255 mg/dL — ABNORMAL HIGH (ref 70–99)
Potassium: 2.8 mmol/L — ABNORMAL LOW (ref 3.5–5.1)
Potassium: 3.6 mmol/L (ref 3.5–5.1)
Sodium: 128 mmol/L — ABNORMAL LOW (ref 135–145)
Sodium: 129 mmol/L — ABNORMAL LOW (ref 135–145)

## 2019-06-23 LAB — GLUCOSE, CAPILLARY
Glucose-Capillary: 168 mg/dL — ABNORMAL HIGH (ref 70–99)
Glucose-Capillary: 217 mg/dL — ABNORMAL HIGH (ref 70–99)
Glucose-Capillary: 245 mg/dL — ABNORMAL HIGH (ref 70–99)
Glucose-Capillary: 233 mg/dL — ABNORMAL HIGH (ref 70–99)
Glucose-Capillary: 338 mg/dL — ABNORMAL HIGH (ref 70–99)

## 2019-06-23 LAB — VITAMIN B12: Vitamin B-12: 1343 pg/mL — ABNORMAL HIGH (ref 180–914)

## 2019-06-23 LAB — TSH: TSH: 1.936 u[IU]/mL (ref 0.350–4.500)

## 2019-06-23 MED ORDER — INSULIN GLARGINE 100 UNIT/ML ~~LOC~~ SOLN
15.0000 [IU] | Freq: Every day | SUBCUTANEOUS | Status: DC
  Administered 2019-06-23: 22:00:00 15 [IU] via SUBCUTANEOUS
  Filled 2019-06-23 (×2): qty 0.15

## 2019-06-23 MED ORDER — TRAMADOL HCL 50 MG PO TABS
50.0000 mg | ORAL_TABLET | Freq: Once | ORAL | Status: AC
  Administered 2019-06-23: 50 mg via ORAL
  Filled 2019-06-23: qty 1

## 2019-06-23 MED ORDER — SODIUM CHLORIDE 0.9 % IV SOLN: INTRAVENOUS | Status: DC

## 2019-06-23 MED ORDER — SODIUM CHLORIDE 0.9 % IV SOLN
1000.0000 mg | INTRAVENOUS | Status: AC
  Administered 2019-06-23 – 2019-06-27 (×5): 1000 mg via INTRAVENOUS
  Filled 2019-06-23 (×7): qty 8

## 2019-06-23 NOTE — ED Notes (Signed)
Phoned report to receiving RN. Arranging transport at this time.

## 2019-06-23 NOTE — Evaluation (Signed)
Occupational Therapy Evaluation Patient Details Name: Luke Edwards MRN: [id number] DOB: 04/26/1940 Today's Date: 06/23/2019    History of Present Illness Pt is a 79 yo male with sudden onset of chorea movements only on R side in RUE/RLE. Pt being ruled out for possible basal ganglia auto immune diagnosis causing movements. PMHx: HTN, HLD, DM, COVID PNA, weight loss.   Clinical Impression   Pt PTA: pt living with spouse independently, working. Pt currently limited by R sided choreic movements. Pt displaying good safety awareness with SPC for mobility ~200' in hallway with minguardA and supervisionA overall for ADL tasks in sitting or standing. Pt limited slightly with coordination in RUE as pt unable to completely control movements due to chorea. Pt tolerating session well. Pt advised supervisionA for ADL and mobility; pt advised to perform lower body ADL in sitting for safety. Pt does not require continued OT skilled services as pt supervisionA level. OT signing off.  **Daughter in room; pt hopes to resume HHPT for strength mobility that he had been performing since his covid episodes.     Follow Up Recommendations  No OT follow up;Supervision - Intermittent    Equipment Recommendations  None recommended by OT    Recommendations for Other Services       Precautions / Restrictions Precautions Precautions: Fall Restrictions Weight Bearing Restrictions: No      Mobility Bed Mobility Overal bed mobility: Needs Assistance Bed Mobility: Supine to Sit     Supine to sit: Supervision     General bed mobility comments: for safety  Transfers Overall transfer level: Needs assistance Equipment used: Straight cane Transfers: Sit to/from Stand Sit to Stand: Min guard         General transfer comment: assist for safety    Balance Overall balance assessment: Mild deficits observed, not formally tested                                         ADL either  performed or assessed with clinical judgement   ADL Overall ADL's : At baseline                                       General ADL Comments: SupervisionA level advised. Pt mobilizing well with SPC and mobility iwth supervisionA. no LOB episodes. Pt stood for grooming at sink and fixing own socks at EOB. Pt reports he stood for bathing tasks earlier with daughter present.     Vision Baseline Vision/History: No visual deficits;Wears glasses Wears Glasses: Reading only Patient Visual Report: No change from baseline Vision Assessment?: Yes Eye Alignment: Within Functional Limits Ocular Range of Motion: Within Functional Limits Alignment/Gaze Preference: Within Defined Limits Tracking/Visual Pursuits: Able to track stimulus in all quads without difficulty     Perception     Praxis      Pertinent Vitals/Pain Pain Assessment: No/denies pain     Hand Dominance Right   Extremity/Trunk Assessment Upper Extremity Assessment Upper Extremity Assessment: Overall WFL for tasks assessed;RUE deficits/detail;LUE deficits/detail RUE Deficits / Details: 5/5 strength; choreic movements noted at rest; can stop them with purposeful movements. RUE Coordination: decreased fine motor LUE Deficits / Details: strength 5/5.   Lower Extremity Assessment Lower Extremity Assessment: Overall WFL for tasks assessed;RLE deficits/detail RLE Deficits / Details: Pt with choreic movements   Cervical /  Trunk Assessment Cervical / Trunk Assessment: Normal   Communication Communication Communication: No difficulties   Cognition Arousal/Alertness: Awake/alert Behavior During Therapy: WFL for tasks assessed/performed Overall Cognitive Status: Within Functional Limits for tasks assessed                                     General Comments  Pt ambulating minguardA with SPC ~200' with no LOB episodes. Pt's daughter in room. 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 Pt appears to have an optimistic approach to this hospital stay.    Exercises     Shoulder Instructions      Home Living Family/patient expects to be discharged to:: Private residence Living Arrangements: Spouse/significant other;Children Available Help at Discharge: Family Type of Home: House Home Access: Ramped entrance     [place]: One level     Bathroom Shower/Tub: Walk-in shower;Tub/shower unit   Bathroom Toilet: Handicapped height     Home Equipment: Cane - single point;Shower seat;Walker - 2 wheels          Prior Functioning/Environment Level of Independence: Independent with assistive device(s)        Comments: Using cane for mobility; reports he was reciving HHPT for  progrsesive therapy  due to COVID PNA.        OT Problem List: Decreased coordination      OT Treatment/Interventions:      OT Goals(Current goals can be found in the care plan section) Acute Rehab OT Goals Patient Stated Goal: to go home with a definite diagnosis OT Goal Formulation: All assessment and education complete, DC therapy  OT Frequency:     Barriers to D/C:            Co-evaluation              AM-PAC OT "6 Clicks" Daily Activity     Outcome Measure Help from another person eating meals?: None Help from another person taking care of personal grooming?: A Little Help from another person toileting, which includes using toliet, bedpan, or urinal?: None Help from another person bathing (including washing, rinsing, drying)?: A Little Help from another person to put on and taking off regular upper body clothing?: None Help from another person to put on and taking off regular lower body clothing?: None 6 Click Score: 22   End of Session Equipment Utilized During Treatment: Gait belt Nurse Communication: Mobility status  Activity Tolerance: Patient tolerated treatment well Patient left: in bed;with call bell/phone within reach;with bed alarm set;with  family/visitor present  OT Visit Diagnosis: Unsteadiness on feet (R26.81)                Time: 1210-1240 OT Time Calculation (min): 30 min Charges:  OT General Charges $OT Visit: 1 Visit OT Evaluation $OT Eval Moderate Complexity: 1 Mod OT Treatments $Self Care/Home Management : 8-22 mins  Jefferey Pica, OTR/L Acute Rehabilitation Services Pager: [phone] Office: [phone]   Tiffnay Bossi C 06/23/2019, 3:19 PM

## 2019-06-23 NOTE — Progress Notes (Signed)
PROGRESS NOTE    Sanuel Edwards  [id number] DOB: [birth year] DOA: 06/22/2019 PCP: Bonnita Nasuti, MD     Brief Narrative:  Luke Edwards is a 79 y.o. male with medical history significant for hypertension, hyperlipidemia, insulin-dependent type 2 diabetes, recent COVID pneumonia (positive on 1/18) requiring hospitalization who presents with concerns of acute onset of Chorea like movements.   He had abrupt onset of choreiform movements of the right upper and lower extremity starting 2 days ago.  Wife also told him that he seems to be "acting funny" and was having rapid speech.  He denies any family or personal history of anything similar.  Denies any trauma.  He denies any tobacco or illicit drug use.  Has occasional alcohol use every Wednesday.  He reports about a 30 pound weight loss but that was due to him being hospitalized for Covid.  He has had issues with hyperglycemia post-Covid.   At time of admission, neurology was consulted for chorea. He underwent LP, MRI brain, CT C/A/P without acute findings to explain his symptoms.   New events last 24 hours / Subjective: Continues to have chorea movements of the right upper and lower extremities today.  Otherwise feeling well without any complaints.  Assessment & Plan:   Principal Problem:   Chorea Active Problems:   COVID-19 virus infection   HTN (hypertension)   Hyponatremia   Anemia   Type 2 diabetes mellitus with hyperlipidemia (HCC)    Choreiform movements -Neurology following -CT C/A/P negative  -MRI brain negative -LP negative  -Per neurology, examination is consistent with basal ganglia pathology, does not appear to be psychogenic. Negative MRI does not rule out basal ganglia autoimmune pathology. Most likely etiology felt to be autoimmune in nature. Anti CRMP5 antibody, Anti-Hu antibody titer pending -Solumedrol 1g daily x 5 days   Hyponatremia -IV fluid -Improving. Trend BMP   Type 2 diabetes with  hyperglycemia -Hold home metformin, glipizide -Lantus, SSI. Close monitoring while getting IV solumedrol.   Hypertension  -Continue Lisinopril-HCTZ  Hx of COVID infection  -Tested positive on 1/18. No repeat testing recommend since it is still within 3 months and he is not here for COVID symptoms   BPH -Continue proscar, flomax    DVT prophylaxis: Lovenox  Code Status: Full code Family Communication: Family member at bedside Disposition Plan:  . Patient is from home prior to admission. . Currently in-hospital treatment needed due to continued neurologic work-up, IV Solu-Medrol started today. . Suspect patient will discharge back home once cleared from neurology standpoint. PT OT eval ordered.    Consultants:   Neurology  Procedures:   LP 3/13  Antimicrobials:  Anti-infectives (From admission, onward)   None        Objective: Vitals:   06/22/19 2330 06/22/19 2345 06/23/19 0050 06/23/19 0357  BP: (!) 141/94 133/88 139/64 127/77  Pulse: 89 89 92 (!) 103  Resp:   16 19  Temp:   99 F (37.2 C) 98.4 F (36.9 C)  TempSrc:   Oral   SpO2: 96% 96% 98% 99%  Weight:   85.5 kg   Height:   6\' 2"  (1.88 m)     Intake/Output Summary (Last 24 hours) at 06/23/2019 1045 Last data filed at 06/23/2019 0130 Gross per 24 hour  Intake 240 ml  Output --  Net 240 ml   Filed Weights   06/23/19 0050  Weight: 85.5 kg    Examination:  General exam: Appears calm and comfortable  Respiratory system: Clear  to auscultation. Respiratory effort normal. No respiratory distress. No conversational dyspnea.  Cardiovascular system: S1 & S2 heard, RRR. No murmurs. No pedal edema. Gastrointestinal system: Abdomen is nondistended, soft and nontender. Normal bowel sounds heard. Central nervous system: Alert and oriented. +Spontaneous jerking movements of RUE and RLE  Extremities: Symmetric in appearance  Skin: No rashes, lesions or ulcers on exposed skin  Psychiatry: Judgement and insight  appear normal. Mood & affect appropriate.   Data Reviewed: I have personally reviewed following labs and imaging studies  CBC: Recent Labs  Lab 06/22/19 0929 06/23/19 0456  WBC 9.1 8.3  NEUTROABS 5.6  --   HGB 11.7* 11.5*  HCT 33.1* 32.4*  MCV 89.7 89.8  PLT 270 [id number]   Basic Metabolic Panel: Recent Labs  Lab 06/22/19 0929 06/23/19 0005 06/23/19 0456  NA 124* 128* 129*  K 3.7 2.8* 3.6  CL 88* 98 92*  CO2 23 20* 25  GLUCOSE 133* 151* 255*  BUN 9 7* 8  CREATININE 1.02 0.75 1.07  CALCIUM 8.7* 7.3* 8.8*   GFR: Estimated Creatinine Clearance: 66.2 mL/min (by C-G formula based on SCr of 1.07 mg/dL). Liver Function Tests: Recent Labs  Lab 06/22/19 0929  AST 21  ALT 17  ALKPHOS 55  BILITOT 0.8  PROT 6.2*  ALBUMIN 3.4*   No results for input(s): LIPASE, AMYLASE in the last 168 hours. No results for input(s): AMMONIA in the last 168 hours. Coagulation Profile: No results for input(s): INR, PROTIME in the last 168 hours. Cardiac Enzymes: No results for input(s): CKTOTAL, CKMB, CKMBINDEX, TROPONINI in the last 168 hours. BNP (last 3 results) No results for input(s): PROBNP in the last 8760 hours. HbA1C: No results for input(s): HGBA1C in the last 72 hours. CBG: Recent Labs  Lab 06/23/19 0103 06/23/19 0608  GLUCAP 168* 217*   Lipid Profile: No results for input(s): CHOL, HDL, LDLCALC, TRIG, CHOLHDL, LDLDIRECT in the last 72 hours. Thyroid Function Tests: Recent Labs    06/23/19 0005  TSH 1.936   Anemia Panel: Recent Labs    06/23/19 0456  VITAMINB12 1,343*  TIBC 337  IRON 42*   Sepsis Labs: No results for input(s): PROCALCITON, LATICACIDVEN in the last 168 hours.  Recent Results (from the past 240 hour(s))  CSF culture     Status: None (Preliminary result)   Collection Time: 06/22/19  2:12 PM   Specimen: Back; Cerebrospinal Fluid  Result Value Ref Range Status   Specimen Description BACK  Final   Special Requests NONE  Final   Gram Stain   Final     NO WBC SEEN NO ORGANISMS SEEN CYTOSPIN SMEAR Performed at [facility], [address], [place], [place] [address]    Culture PENDING  Incomplete   Report Status PENDING  Incomplete      Radiology Studies: CT Head Wo Contrast  Result Date: 06/22/2019 CLINICAL DATA:  Involuntary spasm/contraction of right arm and leg. Began 2 days ago. EXAM: CT HEAD WITHOUT CONTRAST TECHNIQUE: Contiguous axial images were obtained from the base of the skull through the vertex without intravenous contrast. COMPARISON:  Brain MRI, 08/09/2013 FINDINGS: Brain: No evidence of acute infarction, hemorrhage, hydrocephalus, extra-axial collection or mass lesion/mass effect. Left thalamic lacunar infarct, old. Mild bilateral white matter hypoattenuation consistent with chronic microvascular ischemic change. Vascular: No hyperdense vessel or unexpected calcification. Skull: Normal. Negative for fracture or focal lesion. Sinuses/Orbits: Globes and orbits are unremarkable. Left maxillary sinus mucous retention cyst. Sinuses otherwise clear. Other: None. IMPRESSION:  1. No acute intracranial abnormalities. 2. Old left thalamic lacunar infarct. Mild chronic microvascular ischemic change. Electronically Signed   By: Lajean Manes M.D.   On: 06/22/2019 08:52   CT CHEST W CONTRAST  Result Date: 06/22/2019 CLINICAL DATA:  Ataxia. EXAM: CT CHEST, ABDOMEN, AND PELVIS WITH CONTRAST TECHNIQUE: Multidetector CT imaging of the chest, abdomen and pelvis was performed following the standard protocol during bolus administration of intravenous contrast. CONTRAST:  170mL OMNIPAQUE IOHEXOL 300 MG/ML  SOLN COMPARISON:  None. FINDINGS: CT CHEST FINDINGS Cardiovascular: No significant vascular findings. Normal heart size. No pericardial effusion. Mild to moderate severity coronary artery calcification is seen. Mediastinum/Nodes: No enlarged mediastinal, hilar, or axillary lymph nodes. Thyroid gland, trachea, and esophagus demonstrate no  significant findings. Lungs/Pleura: Mild atelectasis is seen along the posterior aspect of the left upper lobe and posterior aspects of the bilateral lower lobes. There is no evidence of a pleural effusion or pneumothorax. Musculoskeletal: No chest wall mass or suspicious bone lesions identified. CT ABDOMEN PELVIS FINDINGS Hepatobiliary: No focal liver abnormality is seen. Status post cholecystectomy. No biliary dilatation. Pancreas: Unremarkable. No pancreatic ductal dilatation or surrounding inflammatory changes. Spleen: Normal in size without focal abnormality. Adrenals/Urinary Tract: Adrenal glands are unremarkable. Kidneys are normal, without renal calculi, focal lesion, or hydronephrosis. Bladder is unremarkable. Stomach/Bowel: Stomach is within normal limits. The appendix is not identified. No evidence of bowel wall thickening, distention, or inflammatory changes. Noninflamed diverticula are seen within the descending and sigmoid colon. Vascular/Lymphatic: Mild aortic atherosclerosis. No enlarged abdominal or pelvic lymph nodes. Reproductive: There is moderate to marked severity prostate gland enlargement. Other: No abdominal wall hernia or abnormality. No abdominopelvic ascites. Musculoskeletal: No acute or significant osseous findings. IMPRESSION: 1. No evidence of acute abnormalities within the chest, abdomen or pelvis. 2. Mild to moderate severity coronary artery calcification. 3. Noninflamed diverticula of the descending and sigmoid colon. 4. Moderate to marked severity prostate gland enlargement. Please correlate to PSA values. Aortic Atherosclerosis (ICD10-I70.0). Electronically Signed   By: Virgina Norfolk M.D.   On: 06/22/2019 22:20   MR BRAIN W WO CONTRAST  Result Date: 06/22/2019 CLINICAL DATA:  Focal neuro deficit with stroke suspected. EXAM: MRI HEAD WITHOUT AND WITH CONTRAST TECHNIQUE: Multiplanar, multiecho pulse sequences of the brain and surrounding structures were obtained without and  with intravenous contrast. CONTRAST:  75mL GADAVIST GADOBUTROL 1 MMOL/ML IV SOLN COMPARISON:  Head CT earlier today FINDINGS: Brain: No acute infarction, hemorrhage, hydrocephalus, extra-axial collection or mass lesion. Cystic intensity in the left thalamus which is very rounded and long-standing. Given the shape and lack of adjacent gliosis suspect this is a dilated perivascular space or neuroglial cyst. Slight enlargement from before is non worrisome. There has been progression of chronic small vessel ischemia in the cerebral white matter and a generalized decrease in cerebral volume. No abnormal intracranial enhancement. Vascular: Normal flow voids and vascular enhancements Skull and upper cervical spine: Normal marrow signal Sinuses/Orbits: Retention cysts in the left maxillary sinus. IMPRESSION: Aging of the brain since 2015 with no acute or reversible finding. Electronically Signed   By: Monte Fantasia M.D.   On: 06/22/2019 12:52   CT ABDOMEN PELVIS W CONTRAST  Result Date: 06/22/2019 CLINICAL DATA:  Ataxia EXAM: CT CHEST, ABDOMEN, AND PELVIS WITH CONTRAST TECHNIQUE: Multidetector CT imaging of the chest, abdomen and pelvis was performed following the standard protocol during bolus administration of intravenous contrast. CONTRAST:  163mL OMNIPAQUE IOHEXOL 300 MG/ML  SOLN COMPARISON:  March 23, 2015 FINDINGS: CT CHEST  FINDINGS Cardiovascular: No significant vascular findings. Normal heart size. No pericardial effusion. Mild to moderate severity coronary artery calcification is seen. Mediastinum/Nodes: No enlarged mediastinal, hilar, or axillary lymph nodes. Thyroid gland, trachea, and esophagus demonstrate no significant findings. Lungs/Pleura: Mild atelectasis is seen along the posterior aspect of the left upper lobe and posterior aspects of the bilateral lower lobes. There is no evidence of a pleural effusion or pneumothorax. Musculoskeletal: No chest wall mass or suspicious bone lesions identified.  CT ABDOMEN PELVIS FINDINGS Hepatobiliary: No focal liver abnormality is seen. Status post cholecystectomy. No biliary dilatation. Pancreas: Unremarkable. No pancreatic ductal dilatation or surrounding inflammatory changes. Spleen: Normal in size without focal abnormality. Adrenals/Urinary Tract: Adrenal glands are unremarkable. Kidneys are normal, without renal calculi, focal lesion, or hydronephrosis. Bladder is unremarkable. Stomach/Bowel: Stomach is within normal limits. The appendix is not identified. No evidence of bowel wall thickening, distention, or inflammatory changes. Noninflamed diverticula are seen within the descending and sigmoid colon. Vascular/Lymphatic: Mild aortic atherosclerosis. No enlarged abdominal or pelvic lymph nodes. Reproductive: There is moderate to marked severity prostate gland enlargement. Other: No abdominal wall hernia or abnormality. No abdominopelvic ascites. Musculoskeletal: No acute or significant osseous findings. IMPRESSION: 1. No evidence of acute abnormalities within the chest, abdomen or pelvis. 2. Mild to moderate severity coronary artery calcification. 3. Noninflamed diverticula of the descending and sigmoid colon. 4. Moderate to marked severity prostate gland enlargement. Please correlate to PSA values. Aortic Atherosclerosis (ICD10-I70.0). Electronically Signed   By: Virgina Norfolk M.D.   On: 06/22/2019 22:18   DG FLUORO GUIDE LUMBAR PUNCTURE  Result Date: 06/22/2019 CLINICAL DATA:  79 year old male with choreiform movements. Evaluate CSF. EXAM: DIAGNOSTIC LUMBAR PUNCTURE UNDER FLUOROSCOPIC GUIDANCE FLUOROSCOPY TIME:  Fluoroscopy Time:  0.5 minutes Number of Acquired Spot Images: 1 PROCEDURE: Informed consent was obtained from the patient prior to the procedure, including potential complications of headache, allergy, and pain. With the patient prone, the lower back was prepped with Betadine. 1% Lidocaine was used for local anesthesia. Lumbar puncture was  performed at the L3-4 level using a 20 gauge needle with return of clear colorless CSF with an opening pressure of 16 cm water. Ten ml of CSF were obtained for laboratory studies. The patient tolerated the procedure well and there were no apparent complications. IMPRESSION: Fluoroscopic guided lumbar puncture.  No immediate complication. Electronically Signed   By: Margarette Canada M.D.   On: 06/22/2019 17:33      Scheduled Meds: . aspirin EC  81 mg Oral Daily  . enoxaparin (LOVENOX) injection  40 mg Subcutaneous Q24H  . famotidine  20 mg Oral Daily  . finasteride  5 mg Oral QHS  . lisinopril  20 mg Oral Daily   And  . hydrochlorothiazide  25 mg Oral Daily  . insulin aspart  0-15 Units Subcutaneous TID WC  . tamsulosin  0.4 mg Oral QPC supper   Continuous Infusions: . methylPREDNISolone (SOLU-MEDROL) injection       LOS: 1 day      Time spent: 40 minutes   Dessa Phi, DO [facility] 06/23/2019, 10:45 AM   Available via Epic secure chat 7am-7pm After these hours, please refer to coverage provider listed on [email or web address]

## 2019-06-23 NOTE — Progress Notes (Signed)
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   Objective: Current vital signs: BP 127/77 (BP Location: Right Arm)   Pulse (!) 103   Temp 98.4 F (36.9 C)   Resp 19   Ht 6' 2"  (1.88 m)   Wt 85.5 kg   SpO2 99%   BMI 24.20 kg/m  Vital signs in last 24 hours: Temp:  [98.4 F (36.9 C)-99 F (37.2 C)] 98.4 F (36.9 C) (03/14 0357) Pulse Rate:  [75-133] 103 (03/14 0357) Resp:  [14-31] 19 (03/14 0357) BP: (127-161)/(64-102) 127/77 (03/14 0357) SpO2:  [94 %-100 %] 99 % (03/14 0357) Weight:  [85.5 kg] 85.5 kg (03/14 0050)  Intake/Output from previous day: 03/13 0701 - 03/14 0700 In: 240 [P.O.:240] Out: -  Intake/Output this shift: No intake/output data recorded. Nutritional status:  Diet Order            Diet Heart Room service appropriate? Yes; Fluid consistency: Thin  Diet effective now             HEENT: [place]/AT Lungs: Respirations unlabored  Neurological Examination Mental Status: Alert, oriented, thought content appropriate.  Speech fluent without evidence of aphasia.  Able to follow 3 step commands without difficulty. Cranial Nerves: Unchanged Motor: Choreiform  movements of the right arm and leg continue, and are unchanged since yesterday. When the patient is focused on goal-directed movements with either the right-sided or left-sided limbs, as well as during visual tracking, the movements either stop or significantly decrease in amplitude Gait: Deferred  Lab Results: Results for orders placed or performed during the hospital encounter of 06/22/19 (from the past 48 hour(s))  Urinalysis, Routine w reflex microscopic     Status: Abnormal   Collection Time: 06/22/19  9:28 AM  Result Value Ref Range   Color, Urine YELLOW YELLOW   APPearance CLEAR CLEAR   Specific Gravity, Urine 1.010 1.005 - 1.030   pH 6.0 5.0 - 8.0   Glucose, UA 50 (A) NEGATIVE mg/dL   Hgb urine dipstick NEGATIVE NEGATIVE   Bilirubin Urine NEGATIVE NEGATIVE   Ketones, ur NEGATIVE NEGATIVE  mg/dL   Protein, ur NEGATIVE NEGATIVE mg/dL   Nitrite NEGATIVE NEGATIVE   Leukocytes,Ua NEGATIVE NEGATIVE    Comment: Performed at [facility], [address], [place], [place] [address]  Rapid urine drug screen (hospital performed)     Status: Abnormal   Collection Time: 06/22/19  9:28 AM  Result Value Ref Range   Opiates NONE DETECTED NONE DETECTED   Cocaine NONE DETECTED NONE DETECTED   Benzodiazepines POSITIVE (A) NONE DETECTED   Amphetamines NONE DETECTED NONE DETECTED   Tetrahydrocannabinol NONE DETECTED NONE DETECTED   Barbiturates NONE DETECTED NONE DETECTED    Comment: (NOTE) DRUG SCREEN FOR MEDICAL PURPOSES ONLY.  IF CONFIRMATION IS NEEDED FOR ANY PURPOSE, NOTIFY LAB WITHIN 5 DAYS. LOWEST DETECTABLE LIMITS FOR URINE DRUG SCREEN Drug Class                     Cutoff (ng/mL) Amphetamine and metabolites    1000 Barbiturate and metabolites    200 Benzodiazepine                 572 Tricyclics and metabolites     300 Opiates and metabolites        300 Cocaine and metabolites        300 THC  50 Performed at [facility], [place] [address], [place], [place] [address]   Comprehensive metabolic panel     Status: Abnormal   Collection Time: 06/22/19  9:29 AM  Result Value Ref Range   Sodium 124 (L) 135 - 145 mmol/L   Potassium 3.7 3.5 - 5.1 mmol/L   Chloride 88 (L) 98 - 111 mmol/L   CO2 23 22 - 32 mmol/L   Glucose, Bld 133 (H) 70 - 99 mg/dL    Comment: Glucose reference range applies only to samples taken after fasting for at least 8 hours.   BUN 9 8 - 23 mg/dL   Creatinine, Ser 1.02 0.61 - 1.24 mg/dL   Calcium 8.7 (L) 8.9 - 10.3 mg/dL   Total Protein 6.2 (L) 6.5 - 8.1 g/dL   Albumin 3.4 (L) 3.5 - 5.0 g/dL   AST 21 15 - 41 U/L   ALT 17 0 - 44 U/L   Alkaline Phosphatase 55 38 - 126 U/L   Total Bilirubin 0.8 0.3 - 1.2 mg/dL   GFR calc non Af Amer >60 >60 mL/min   GFR calc Af Amer >60 >60 mL/min   Anion gap 13 5 - 15    Comment:  Performed at [facility] [address] [place], [place] [address]  Ethanol     Status: None   Collection Time: 06/22/19  9:29 AM  Result Value Ref Range   Alcohol, Ethyl (B) <10 <10 mg/dL    Comment: (NOTE) Lowest detectable limit for serum alcohol is 10 mg/dL. For medical purposes only. Performed at [facility], [place] [address], [place], [place] [address]   CBC with Differential     Status: Abnormal   Collection Time: 06/22/19  9:29 AM  Result Value Ref Range   WBC 9.1 4.0 - 10.5 K/uL   RBC 3.69 (L) 4.22 - 5.81 MIL/uL   Hemoglobin 11.7 (L) 13.0 - 17.0 g/dL   HCT 33.1 (L) 39.0 - 52.0 %   MCV 89.7 80.0 - 100.0 fL   MCH 31.7 26.0 - 34.0 pg   MCHC 35.3 30.0 - 36.0 g/dL   RDW 13.5 11.5 - 15.5 %   Platelets 270 150 - 400 K/uL   nRBC 0.0 0.0 - 0.2 %   Neutrophils Relative % 60 %   Neutro Abs 5.6 1.7 - 7.7 K/uL   Lymphocytes Relative 26 %   Lymphs Abs 2.3 0.7 - 4.0 K/uL   Monocytes Relative 11 %   Monocytes Absolute 1.0 0.1 - 1.0 K/uL   Eosinophils Relative 1 %   Eosinophils Absolute 0.1 0.0 - 0.5 K/uL   Basophils Relative 1 %   Basophils Absolute 0.1 0.0 - 0.1 K/uL   Immature Granulocytes 1 %   Abs Immature Granulocytes 0.05 0.00 - 0.07 K/uL    Comment: Performed at [facility] [address] [place], [place] [address]  Sedimentation rate     Status: Abnormal   Collection Time: 06/22/19  9:29 AM  Result Value Ref Range   Sed Rate 20 (H) 0 - 16 mm/hr    Comment: Performed at [facility] [address], [place], [place] [address]  Glucose, CSF     Status: None   Collection Time: 06/22/19  2:05 PM  Result Value Ref Range   Glucose, CSF 56 40 - 70 mg/dL    Comment: Performed at [facility] [address] [place], [place] [address]  Protein,  CSF     Status: None   Collection Time: 06/22/19  2:05 PM  Result Value Ref Range   Total  Protein, CSF 35 15 - 45 mg/dL    Comment: Performed at [facility] [address],  [place], [place] [address]  CSF culture     Status: None (Preliminary result)   Collection Time: 06/22/19  2:12 PM   Specimen: Back; Cerebrospinal Fluid  Result Value Ref Range   Specimen Description BACK    Special Requests NONE    Gram Stain      NO WBC SEEN NO ORGANISMS SEEN CYTOSPIN SMEAR Performed at [facility], [place] [address] [place], [place] [address]    Culture PENDING    Report Status PENDING   CSF cell count with differential collection tube #: 1     Status: None   Collection Time: 06/22/19  3:05 PM  Result Value Ref Range   Tube # 1    Color, CSF COLORLESS COLORLESS   Appearance, CSF CLEAR CLEAR   Supernatant NOT INDICATED    RBC Count, CSF 0 0 /cu mm   WBC, CSF 1 0 - 5 /cu mm   Other Cells, CSF TOO FEW TO COUNT, SMEAR AVAILABLE FOR REVIEW     Comment: FEW LYMPHS, MONOS/MACROS, RARE SEGS Performed at [facility] [address], [place], [place] [address]   CSF cell count with differential collection tube #: 4     Status: None   Collection Time: 06/22/19  3:05 PM  Result Value Ref Range   Tube # 4    Color, CSF COLORLESS COLORLESS   Appearance, CSF CLEAR CLEAR   Supernatant NOT INDICATED    RBC Count, CSF 0 0 /cu mm   WBC, CSF 1 0 - 5 /cu mm   Other Cells, CSF TOO FEW TO COUNT, SMEAR AVAILABLE FOR REVIEW     Comment: FEW LYMPHS, MONO/MACROS Performed at [facility] [address], [place], [place] [address]   Basic metabolic panel     Status: Abnormal   Collection Time: 06/23/19 12:05 AM  Result Value Ref Range   Sodium 128 (L) 135 - 145 mmol/L   Potassium 2.8 (L) 3.5 - 5.1 mmol/L   Chloride 98 98 - 111 mmol/L   CO2 20 (L) 22 - 32 mmol/L   Glucose, Bld 151 (H) 70 - 99 mg/dL    Comment: Glucose reference range applies only to samples taken after fasting for at least 8 hours.   BUN 7 (L) 8 - 23 mg/dL   Creatinine, Ser 0.75 0.61 - 1.24 mg/dL   Calcium 7.3 (L) 8.9 - 10.3 mg/dL   GFR calc non Af Amer >60 >60 mL/min   GFR calc Af Amer >60  >60 mL/min   Anion gap 10 5 - 15    Comment: Performed at [facility] [address], [place], [place] [address]  TSH     Status: None   Collection Time: 06/23/19 12:05 AM  Result Value Ref Range   TSH 1.936 0.350 - 4.500 uIU/mL    Comment: Performed by a 3rd Generation assay with a functional sensitivity of <=0.01 uIU/mL. Performed at [facility], [place] [address], [place], [place] [address]   Glucose, capillary     Status: Abnormal   Collection Time: 06/23/19  1:03 AM  Result Value Ref Range   Glucose-Capillary 168 (H) 70 - 99 mg/dL  Comment: Glucose reference range applies only to samples taken after fasting for at least 8 hours.  Basic metabolic panel     Status: Abnormal   Collection Time: 06/23/19  4:56 AM  Result Value Ref Range   Sodium 129 (L) 135 - 145 mmol/L   Potassium 3.6 3.5 - 5.1 mmol/L   Chloride 92 (L) 98 - 111 mmol/L   CO2 25 22 - 32 mmol/L   Glucose, Bld 255 (H) 70 - 99 mg/dL    Comment: Glucose reference range applies only to samples taken after fasting for at least 8 hours.   BUN 8 8 - 23 mg/dL   Creatinine, Ser 1.07 0.61 - 1.24 mg/dL   Calcium 8.8 (L) 8.9 - 10.3 mg/dL   GFR calc non Af Amer >60 >60 mL/min   GFR calc Af Amer >60 >60 mL/min   Anion gap 12 5 - 15    Comment: Performed at [facility] [address], [place], [place] [address]  CBC     Status: Abnormal   Collection Time: 06/23/19  4:56 AM  Result Value Ref Range   WBC 8.3 4.0 - 10.5 K/uL   RBC 3.61 (L) 4.22 - 5.81 MIL/uL   Hemoglobin 11.5 (L) 13.0 - 17.0 g/dL   HCT 32.4 (L) 39.0 - 52.0 %   MCV 89.8 80.0 - 100.0 fL   MCH 31.9 26.0 - 34.0 pg   MCHC 35.5 30.0 - 36.0 g/dL   RDW 13.5 11.5 - 15.5 %   Platelets 294 150 - 400 K/uL   nRBC 0.0 0.0 - 0.2 %    Comment: Performed at [facility], [place] [address] [place], [place] [address]  Iron and TIBC     Status: Abnormal   Collection Time: 06/23/19  4:56 AM  Result Value Ref Range   Iron 42 (L) 45 - 182 ug/dL   TIBC  337 250 - 450 ug/dL   Saturation Ratios 12 (L) 17.9 - 39.5 %   UIBC 295 ug/dL    Comment: Performed at [facility], [place] [address] [place], [place] [address]  Vitamin B12     Status: Abnormal   Collection Time: 06/23/19  4:56 AM  Result Value Ref Range   Vitamin B-12 1,343 (H) 180 - 914 pg/mL    Comment: (NOTE) This assay is not validated for testing neonatal or myeloproliferative syndrome specimens for Vitamin B12 levels. Performed at [facility], [place] [address] [place], [place] [address]   Glucose, capillary     Status: Abnormal   Collection Time: 06/23/19  6:08 AM  Result Value Ref Range   Glucose-Capillary 217 (H) 70 - 99 mg/dL    Comment: Glucose reference range applies only to samples taken after fasting for at least 8 hours.    Recent Results (from the past 240 hour(s))  CSF culture     Status: None (Preliminary result)   Collection Time: 06/22/19  2:12 PM   Specimen: Back; Cerebrospinal Fluid  Result Value Ref Range Status   Specimen Description BACK  Final   Special Requests NONE  Final   Gram Stain   Final    NO WBC SEEN NO ORGANISMS SEEN CYTOSPIN SMEAR Performed at [facility], [address], [place], [place] [address]    Culture PENDING  Incomplete   Report Status PENDING  Incomplete    Lipid Panel No results for input(s): CHOL, TRIG, HDL, CHOLHDL, VLDL, LDLCALC in the  last 72 hours.  Studies/Results: CT Head Wo Contrast  Result Date: 06/22/2019 CLINICAL DATA:  Involuntary spasm/contraction of right arm and leg. Began 2 days ago. EXAM: CT HEAD WITHOUT CONTRAST TECHNIQUE: Contiguous axial images were obtained from the base of the skull through the vertex without intravenous contrast. COMPARISON:  Brain MRI, 08/09/2013 FINDINGS: Brain: No evidence of acute infarction, hemorrhage, hydrocephalus, extra-axial collection or mass lesion/mass effect. Left thalamic lacunar infarct, old. Mild bilateral white matter hypoattenuation consistent with  chronic microvascular ischemic change. Vascular: No hyperdense vessel or unexpected calcification. Skull: Normal. Negative for fracture or focal lesion. Sinuses/Orbits: Globes and orbits are unremarkable. Left maxillary sinus mucous retention cyst. Sinuses otherwise clear. Other: None. IMPRESSION: 1. No acute intracranial abnormalities. 2. Old left thalamic lacunar infarct. Mild chronic microvascular ischemic change. Electronically Signed   By: Lajean Manes M.D.   On: 06/22/2019 08:52   CT CHEST W CONTRAST  Result Date: 06/22/2019 CLINICAL DATA:  Ataxia. EXAM: CT CHEST, ABDOMEN, AND PELVIS WITH CONTRAST TECHNIQUE: Multidetector CT imaging of the chest, abdomen and pelvis was performed following the standard protocol during bolus administration of intravenous contrast. CONTRAST:  150m OMNIPAQUE IOHEXOL 300 MG/ML  SOLN COMPARISON:  None. FINDINGS: CT CHEST FINDINGS Cardiovascular: No significant vascular findings. Normal heart size. No pericardial effusion. Mild to moderate severity coronary artery calcification is seen. Mediastinum/Nodes: No enlarged mediastinal, hilar, or axillary lymph nodes. Thyroid gland, trachea, and esophagus demonstrate no significant findings. Lungs/Pleura: Mild atelectasis is seen along the posterior aspect of the left upper lobe and posterior aspects of the bilateral lower lobes. There is no evidence of a pleural effusion or pneumothorax. Musculoskeletal: No chest wall mass or suspicious bone lesions identified. CT ABDOMEN PELVIS FINDINGS Hepatobiliary: No focal liver abnormality is seen. Status post cholecystectomy. No biliary dilatation. Pancreas: Unremarkable. No pancreatic ductal dilatation or surrounding inflammatory changes. Spleen: Normal in size without focal abnormality. Adrenals/Urinary Tract: Adrenal glands are unremarkable. Kidneys are normal, without renal calculi, focal lesion, or hydronephrosis. Bladder is unremarkable. Stomach/Bowel: Stomach is within normal limits.  The appendix is not identified. No evidence of bowel wall thickening, distention, or inflammatory changes. Noninflamed diverticula are seen within the descending and sigmoid colon. Vascular/Lymphatic: Mild aortic atherosclerosis. No enlarged abdominal or pelvic lymph nodes. Reproductive: There is moderate to marked severity prostate gland enlargement. Other: No abdominal wall hernia or abnormality. No abdominopelvic ascites. Musculoskeletal: No acute or significant osseous findings. IMPRESSION: 1. No evidence of acute abnormalities within the chest, abdomen or pelvis. 2. Mild to moderate severity coronary artery calcification. 3. Noninflamed diverticula of the descending and sigmoid colon. 4. Moderate to marked severity prostate gland enlargement. Please correlate to PSA values. Aortic Atherosclerosis (ICD10-I70.0). Electronically Signed   By: TVirgina NorfolkM.D.   On: 06/22/2019 22:20   MR BRAIN W WO CONTRAST  Result Date: 06/22/2019 CLINICAL DATA:  Focal neuro deficit with stroke suspected. EXAM: MRI HEAD WITHOUT AND WITH CONTRAST TECHNIQUE: Multiplanar, multiecho pulse sequences of the brain and surrounding structures were obtained without and with intravenous contrast. CONTRAST:  170mGADAVIST GADOBUTROL 1 MMOL/ML IV SOLN COMPARISON:  Head CT earlier today FINDINGS: Brain: No acute infarction, hemorrhage, hydrocephalus, extra-axial collection or mass lesion. Cystic intensity in the left thalamus which is very rounded and long-standing. Given the shape and lack of adjacent gliosis suspect this is a dilated perivascular space or neuroglial cyst. Slight enlargement from before is non worrisome. There has been progression of chronic small vessel ischemia in the cerebral white matter and a generalized decrease  in cerebral volume. No abnormal intracranial enhancement. Vascular: Normal flow voids and vascular enhancements Skull and upper cervical spine: Normal marrow signal Sinuses/Orbits: Retention cysts in the  left maxillary sinus. IMPRESSION: Aging of the brain since 2015 with no acute or reversible finding. Electronically Signed   By: Monte Fantasia M.D.   On: 06/22/2019 12:52   CT ABDOMEN PELVIS W CONTRAST  Result Date: 06/22/2019 CLINICAL DATA:  Ataxia EXAM: CT CHEST, ABDOMEN, AND PELVIS WITH CONTRAST TECHNIQUE: Multidetector CT imaging of the chest, abdomen and pelvis was performed following the standard protocol during bolus administration of intravenous contrast. CONTRAST:  113m OMNIPAQUE IOHEXOL 300 MG/ML  SOLN COMPARISON:  March 23, 2015 FINDINGS: CT CHEST FINDINGS Cardiovascular: No significant vascular findings. Normal heart size. No pericardial effusion. Mild to moderate severity coronary artery calcification is seen. Mediastinum/Nodes: No enlarged mediastinal, hilar, or axillary lymph nodes. Thyroid gland, trachea, and esophagus demonstrate no significant findings. Lungs/Pleura: Mild atelectasis is seen along the posterior aspect of the left upper lobe and posterior aspects of the bilateral lower lobes. There is no evidence of a pleural effusion or pneumothorax. Musculoskeletal: No chest wall mass or suspicious bone lesions identified. CT ABDOMEN PELVIS FINDINGS Hepatobiliary: No focal liver abnormality is seen. Status post cholecystectomy. No biliary dilatation. Pancreas: Unremarkable. No pancreatic ductal dilatation or surrounding inflammatory changes. Spleen: Normal in size without focal abnormality. Adrenals/Urinary Tract: Adrenal glands are unremarkable. Kidneys are normal, without renal calculi, focal lesion, or hydronephrosis. Bladder is unremarkable. Stomach/Bowel: Stomach is within normal limits. The appendix is not identified. No evidence of bowel wall thickening, distention, or inflammatory changes. Noninflamed diverticula are seen within the descending and sigmoid colon. Vascular/Lymphatic: Mild aortic atherosclerosis. No enlarged abdominal or pelvic lymph nodes. Reproductive: There is  moderate to marked severity prostate gland enlargement. Other: No abdominal wall hernia or abnormality. No abdominopelvic ascites. Musculoskeletal: No acute or significant osseous findings. IMPRESSION: 1. No evidence of acute abnormalities within the chest, abdomen or pelvis. 2. Mild to moderate severity coronary artery calcification. 3. Noninflamed diverticula of the descending and sigmoid colon. 4. Moderate to marked severity prostate gland enlargement. Please correlate to PSA values. Aortic Atherosclerosis (ICD10-I70.0). Electronically Signed   By: TVirgina NorfolkM.D.   On: 06/22/2019 22:18   DG FLUORO GUIDE LUMBAR PUNCTURE  Result Date: 06/22/2019 CLINICAL DATA:  79year old male with choreiform movements. Evaluate CSF. EXAM: DIAGNOSTIC LUMBAR PUNCTURE UNDER FLUOROSCOPIC GUIDANCE FLUOROSCOPY TIME:  Fluoroscopy Time:  0.5 minutes Number of Acquired Spot Images: 1 PROCEDURE: Informed consent was obtained from the patient prior to the procedure, including potential complications of headache, allergy, and pain. With the patient prone, the lower back was prepped with Betadine. 1% Lidocaine was used for local anesthesia. Lumbar puncture was performed at the L3-4 level using a 20 gauge needle with return of clear colorless CSF with an opening pressure of 16 cm water. Ten ml of CSF were obtained for laboratory studies. The patient tolerated the procedure well and there were no apparent complications. IMPRESSION: Fluoroscopic guided lumbar puncture.  No immediate complication. Electronically Signed   By: JMargarette CanadaM.D.   On: 06/22/2019 17:33    Medications:  Scheduled: . aspirin EC  81 mg Oral Daily  . enoxaparin (LOVENOX) injection  40 mg Subcutaneous Q24H  . famotidine  20 mg Oral Daily  . finasteride  5 mg Oral QHS  . lisinopril  20 mg Oral Daily   And  . hydrochlorothiazide  25 mg Oral Daily  . insulin aspart  0-15  Units Subcutaneous TID WC  . tamsulosin  0.4 mg Oral QPC supper    Continuous:  Initial LP results:    Assessment: 78 year old male presenting with choreiform movements 1. On exam at the time of ED evaluation on Saturday, patient had choreiform movements on the right side > left side.  The movements are unchanged as of today's examination. Of note, when patient performs movements requiring concentration - either on the left side or the right side of his body - the abnormal movements slow down significantly and even stop for a few seconds. This is consistent with basal ganglia pathology and does not appear to be psychogenic. 2. MRI brain does not reveal a structural etiology for the patient's presentation. No abnormal basal ganglia signal or enhancement seen to explain his presentation. Anatomical locations of the subthalamic nuclei are normal in appearance.  3. Most likely etiology for his presentation is felt to be autoimmune. 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 Of note, his CRP is elevated at 3, but ESR is only mildly elevated at 20. Hyperglycemia can precipitate chorea, but this would be expected to be bilateral rather than unilateral as is the case with this patient; additionally, although per daughter his CBG at home was recently > 500, it is now nearly normal at 133 with no improvement to his chorea. His low sodium of 124 does not provide an explanation for his presentation. His movements are not consistent with seizure. Renal function labs are normal.  4. Of note, review of the literature on paraneoplastic chorea reveals a study citing that the most frequently associated cancer is small cell lung cancer (SCLC), with lymphoma, bowel, and kidney cancers also reported. CV2/CRMP5 was the most frequently associated antibody, followed by Hu. Of note, hyperintense lesions of the basal ganglia on T2-weighted images were seldom observed. Hence, the negative MRI  brain in our patient does not rule out basal ganglia autoimmune pathology.  5. CT chest/abdomen/pelvis: No evidence of acute abnormalities within the chest, abdomen or pelvis.  Although there is moderate to marked severity prostate gland enlargement, no chest, abdominal or pelvic mass is described in the Radiology report.   Recommendations: 1. Anti CRMP5 antibody titer (ordered) 2. Anti-Hu antibody titer (ordered) 3. Starting empiric IV Solumedrol 1000 mg qd x 5 days. 4. Monitor CBG at regular intervals while he is on steroids. Also obtain daily BMP and CBC.    LOS: 1 day   @Electronically  signed: Dr. Kerney Elbe 06/23/2019  7:58 AM

## 2019-06-24 LAB — GLUCOSE, CAPILLARY
Glucose-Capillary: 306 mg/dL — ABNORMAL HIGH (ref 70–99)
Glucose-Capillary: 218 mg/dL — ABNORMAL HIGH (ref 70–99)
Glucose-Capillary: 395 mg/dL — ABNORMAL HIGH (ref 70–99)
Glucose-Capillary: 344 mg/dL — ABNORMAL HIGH (ref 70–99)

## 2019-06-24 LAB — BASIC METABOLIC PANEL
Anion gap: 11 (ref 5–15)
BUN: 17 mg/dL (ref 8–23)
CO2: 23 mmol/L (ref 22–32)
Calcium: 8.6 mg/dL — ABNORMAL LOW (ref 8.9–10.3)
Chloride: 98 mmol/L (ref 98–111)
Creatinine, Ser: 1.16 mg/dL (ref 0.61–1.24)
GFR calc Af Amer: >60 mL/min (ref >60)
GFR calc non Af Amer: 60 mL/min — ABNORMAL LOW (ref >60)
Glucose, Bld: 239 mg/dL — ABNORMAL HIGH (ref 70–99)
Potassium: 3.3 mmol/L — ABNORMAL LOW (ref 3.5–5.1)
Sodium: 132 mmol/L — ABNORMAL LOW (ref 135–145)

## 2019-06-24 LAB — VDRL, CSF: VDRL Quant, CSF: NONREACTIVE

## 2019-06-24 MED ORDER — POTASSIUM CHLORIDE CRYS ER 20 MEQ PO TBCR
40.0000 meq | EXTENDED_RELEASE_TABLET | Freq: Once | ORAL | Status: AC
  Administered 2019-06-24: 40 meq via ORAL
  Filled 2019-06-24: qty 2

## 2019-06-24 MED ORDER — GLUCERNA SHAKE PO LIQD
237.0000 mL | Freq: Three times a day (TID) | ORAL | Status: DC
  Administered 2019-06-24 – 2019-06-27 (×7): 237 mL via ORAL
  Filled 2019-06-24: qty 237

## 2019-06-24 MED ORDER — INSULIN ASPART 100 UNIT/ML ~~LOC~~ SOLN
5.0000 [IU] | Freq: Three times a day (TID) | SUBCUTANEOUS | Status: DC
  Administered 2019-06-24 – 2019-06-27 (×10): 5 [IU] via SUBCUTANEOUS

## 2019-06-24 MED ORDER — INSULIN GLARGINE 100 UNIT/ML ~~LOC~~ SOLN
15.0000 [IU] | Freq: Two times a day (BID) | SUBCUTANEOUS | Status: DC
  Administered 2019-06-24 (×2): 15 [IU] via SUBCUTANEOUS
  Filled 2019-06-24 (×4): qty 0.15

## 2019-06-24 MED ORDER — ADULT MULTIVITAMIN W/MINERALS CH
1.0000 | ORAL_TABLET | Freq: Every day | ORAL | Status: DC
  Administered 2019-06-24 – 2019-06-27 (×4): 1 via ORAL
  Filled 2019-06-24 (×4): qty 1

## 2019-06-24 NOTE — Progress Notes (Signed)
Physical Therapy Evaluation  Nurse cleared patient to participate in PT evaluation. Patient with choreic movements RUE and RLE, most evident at rest. Close supervision to contact guard for mobility using his personal wooden SPC. Education on proper height of SPC and patient notes he also has an adjustable SPC at home. Recommend patient resume home PT services to progress him to PLOF of independent and active. Recommend supervision for mobility with use of SPC initially upon discharge home until home PT clears patient to ambulate on his own. Patient verbalized understanding to PT recommendations.   06/24/19 0950  PT Visit Information  Last PT Received On 06/24/19  Assistance Needed +1  History of Present Illness 79 year old male admitted 06/22/19 with sudden onset choreic movements RUE and RLE. Neurology consult 06/22/19 and question of autoimmune process or basal ganglia pathology. 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 LP negative. CT chest/abd/pelvis negative. Patient with recent COVID illness (positive test 04/29/19). 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    Precautions  Precautions Fall;Other (comment)  Precaution Comments R hemichorea  Restrictions  Weight Bearing Restrictions No  Home Living  Family/patient expects to be discharged to: Private residence  Living Arrangements Spouse/significant other;Children (daughter)  Type of [place] entrance;Stairs to enter  Entrance Stairs-Rails Left  Home Layout One level  Bathroom Shower/Tub  Walk-in shower;Tub/shower unit  [employer] - single point;Shower seat;Walker - 2 wheels  Prior Function  Level of Independence Independent with assistive device(s)  Comments Independent to modI. Since discharge home from hospital with [place] patient reports home PT advised him to use [facility] or RW for mobility. Patient reports he does not use AD in the home. He usually takes the ramp to enter the home and not the stairs. He reports a near fall (caught himself).   Pain Assessment  Pain Assessment No/denies pain  Cognition  Arousal/Alertness Awake/alert  Behavior During Therapy Impulsive  General Comments move quickly, cues for safety  Lower Extremity Assessment  Lower Extremity Assessment RLE deficits/detail;LLE deficits/detail  RLE  (BLE strength grossly 5/5)  RLE Coordination  (impaired heel to shin, alternating foot taps slowed after practice), sensation intact to light touch  LLE  (BLE strength grossly 5/5)  LLE Coordination  (LLE coordination grossly intact but mild deficits noted with heel to shin and alternating foot taps), sensation intact to light touch  Bed Mobility  Overal bed mobility Needs Assistance  Bed Mobility Supine to Sit  Supine to sit Supervision  Transfers  Overall transfer level Needs assistance  Equipment used Straight cane  Transfers Sit to/from Stand  Sit to Stand Supervision;Min guard  General transfer comment Sit>stand from EOB, use of patient's personal wood cane during session, sit<>stand from chair x 2 trials without AD  Ambulation/Gait  Ambulation/Gait assistance Min guard;Supervision  Gait Distance (Feet) 150 Feet  Assistive device Straight cane  Gait Pattern/deviations Step-through pattern  General Gait Details Cues for safety  Stairs Yes  Stairs assistance Min guard  Stair Management One rail Left;With cane;Step to pattern  Number of Stairs 5  General stair  comments Patient typically takes the ramp to enter home.  Balance  Overall  balance assessment Mild deficits observed, not formally tested (no overt LOB)  General Comments  General comments (skin integrity, edema, etc.) Patient moves quickly, cues to decrease speed to increase safety. Choreic movements RUE and RLE most evident at rest.   PT - End of Session  Equipment Utilized During Treatment Gait belt  Activity Tolerance Patient tolerated treatment well  Patient left in chair;with call bell/phone within reach;with chair alarm set  Nurse Communication Mobility status  PT Assessment  PT Recommendation/Assessment Patient needs continued PT services  PT Visit Diagnosis Unsteadiness on feet (R26.81);Other abnormalities of gait and mobility (R26.89);Difficulty in walking, not elsewhere classified (R26.2)  PT Problem List Decreased balance;Decreased mobility;Decreased coordination;Decreased safety awareness  PT Plan  PT Frequency (ACUTE ONLY) Min 3X/week  PT Treatment/Interventions (ACUTE ONLY) DME instruction;Gait training;Stair training;Functional mobility training;Therapeutic activities;Therapeutic exercise;Balance training;Neuromuscular re-education;Patient/family education  AM-PAC PT "6 Clicks" Mobility Outcome Measure (Version 2)  Help needed turning from your back to your side while in a flat bed without using bedrails? 4  Help needed moving from lying on your back to sitting on the side of a flat bed without using bedrails? 4  Help needed moving to and from a bed to a chair (including a wheelchair)? 3  Help needed standing up from a chair using your arms (e.g., wheelchair or bedside chair)? 3  Help needed to walk in hospital room? 3  Help needed climbing 3-5 steps with a railing?  3  6 Click Score 20  Consider Recommendation of Discharge To: Home with no services  PT Recommendation  Follow Up Recommendations Home health PT;Supervision for mobility/OOB  PT equipment  (pt owns SPC and RW)  Individuals Consulted  Consulted and Agree with Results and  Recommendations Patient  Acute Rehab PT Goals  Patient Stated Goal to do everything he did before  PT Goal Formulation With patient  Time For Goal Achievement 06/30/19  Potential to Achieve Goals Good  PT Time Calculation  PT Start Time (ACUTE ONLY) 0950  PT Stop Time (ACUTE ONLY) 1022  PT Time Calculation (min) (ACUTE ONLY) 32 min  PT General Charges  $$ ACUTE PT VISIT 1 Visit  PT Evaluation  $PT Eval Moderate Complexity 1 Mod  Written Expression  Dominant Hand Right   Birdie Hopes, PT, DPT Acute Rehab [phone] office

## 2019-06-24 NOTE — Progress Notes (Signed)
Initial Nutrition Assessment  **RD working remotely**  DOCUMENTATION CODES:   Not applicable  INTERVENTION:   Glucerna Shake po TID, each supplement provides 220 kcal and 10 grams of protein  MVI daily  Magic cup TID with meals, each supplement provides 290 kcal and 9 grams of protein   NUTRITION DIAGNOSIS:   Inadequate oral intake related to poor appetite as evidenced by per patient/family report.   GOAL:   Patient will meet greater than or equal to 90% of their needs   MONITOR:   PO intake, Supplement acceptance, Weight trends, Labs, I & O's  REASON FOR ASSESSMENT:   Malnutrition Screening Tool    ASSESSMENT:   Pt with a PMH significant for HTN, HLD, T2DM, recent COVID PNA (1/18) requiring hospitalization who presented with Chorea  Pt reports a poor appetite since having COVID in January, but states his appetite is improving as of yesterday/today. Pt states he ate 100% of his lunch yesterday. Pt reports drinking Boost PTA and is agreeable to oral nutrition supplements while admitted.   Per wt readings in chart, pt has had a 14.2% wt loss x2 months, which is significant for time frame. Pt endorses this wt loss and states it was due to COVID.  Pt likely malnourished; unable to diagnose at this time without nutrition-focused physical exam.   No PO intake documented  Medications reviewed and include: Pepcid, SSI, Novolog, Lantus, Solu-medrol  Labs reviewed: Na 129 (L) CBGs 218-338  UOP: 476ml x24 hours I/O: +1,791.20ml since admit  NUTRITION - FOCUSED PHYSICAL EXAM:  RD unable to perform at this time.   Diet Order:   Diet Order            Diet Carb Modified Fluid consistency: Thin; Room service appropriate? Yes  Diet effective now              EDUCATION NEEDS:   No education needs have been identified at this time  Skin:  Skin Assessment: Reviewed RN Assessment  Last BM:  3/14  Height:   Ht Readings from Last 1 Encounters:  06/23/19 6\' 2"   (1.88 m)    Weight:   Wt Readings from Last 3 Encounters:  06/23/19 85.5 kg  04/29/19 99.9 kg  08/12/17 97.1 kg     BMI:  Body mass index is 24.2 kg/m.  Estimated Nutritional Needs:   Kcal:  2100-2300  Protein:  105-120 grams  Fluid:  >/= 2L   Larkin Ina, MS, RD, LDN RD pager number and weekend/on-call pager number located in [place].

## 2019-06-24 NOTE — Progress Notes (Addendum)
PROGRESS NOTE    Luke Edwards  [id number] DOB: 13-Jun-1940 DOA: 06/22/2019 PCP: Bonnita Nasuti, MD     Brief Narrative:  Luke Edwards is a 79 y.o. male with medical history significant for hypertension, hyperlipidemia, insulin-dependent type 2 diabetes, recent COVID pneumonia (positive on 1/18) requiring hospitalization who presents with concerns of acute onset of Chorea like movements.   He had abrupt onset of choreiform movements of the right upper and lower extremity starting 2 days ago.  Wife also told him that he seems to be "acting funny" and was having rapid speech.  He denies any family or personal history of anything similar.  Denies any trauma.  He denies any tobacco or illicit drug use.  Has occasional alcohol use every Wednesday.  He reports about a 30 pound weight loss but that was due to him being hospitalized for Covid.  He has had issues with hyperglycemia post-Covid.   At time of admission, neurology was consulted for chorea. He underwent LP, MRI brain, CT C/A/P without acute findings to explain his symptoms.   New events last 24 hours / Subjective: Started on IV solumedrol yesterday. He reports some improvement in choreiform movements overnight, although they still are present. No new complaints otherwise.   Assessment & Plan:   Principal Problem:   Chorea Active Problems:   COVID-19 virus infection   HTN (hypertension)   Hyponatremia   Anemia   Type 2 diabetes mellitus with hyperlipidemia (HCC)    Choreiform movements -Neurology following -CT C/A/P negative  -MRI brain negative -LP negative  -Per neurology, examination is consistent with basal ganglia pathology, does not appear to be psychogenic. Negative MRI does not rule out basal ganglia autoimmune pathology. Most likely etiology felt to be autoimmune in nature. Anti CRMP5 antibody, Anti-Hu antibody titer pending -Solumedrol 1g daily x 5 days, 3/14-3/18  Hyponatremia -IV fluid -Improving. Trend  BMP. BMP pending this morning   Type 2 diabetes with hyperglycemia -Hold home metformin, glipizide -Lantus, SSI. Insulin dose increased today. Close monitoring while getting IV solumedrol.   Hypertension  -Continue Lisinopril-HCTZ  Hx of COVID infection  -Tested positive on 1/18. No repeat testing recommend since it is still within 3 months and he is not here for COVID symptoms   BPH -Continue proscar, flomax    DVT prophylaxis: Lovenox  Code Status: Full code Family Communication: None at bedside; discussed with daughter over the phone  Disposition Plan:  . Patient is from home prior to admission.  . Currently in-hospital treatment needed due to continued neurologic work-up, IV Solu-Medrol started 3/14. . Suspect patient will discharge back home once cleared from neurology standpoint and completed IV solumedrol.     Consultants:   Neurology  Procedures:   LP 3/13  Antimicrobials:  Anti-infectives (From admission, onward)   None       Objective: Vitals:   06/24/19 0048 06/24/19 0421 06/24/19 0500 06/24/19 0908  BP: (!) 144/88 132/64  (!) 151/82  Pulse: 87 88  97  Resp:  18 14 (!) 24  Temp: 97.6 F (36.4 C) (!) 97 F (36.1 C)  98.1 F (36.7 C)  TempSrc: Oral Oral  Oral  SpO2: 98% 97%  99%  Weight:      Height:        Intake/Output Summary (Last 24 hours) at 06/24/2019 1119 Last data filed at 06/24/2019 0600 Gross per 24 hour  Intake 2001.85 ml  Output 450 ml  Net 1551.85 ml   Filed Weights   06/23/19  0050  Weight: 85.5 kg    Examination: General exam: Appears calm and comfortable  Respiratory system: Clear to auscultation. Respiratory effort normal. Cardiovascular system: S1 & S2 heard, RRR. No pedal edema. Gastrointestinal system: Abdomen is nondistended, soft and nontender. Normal bowel sounds heard. Central nervous system: Alert and oriented. +continued jerking movements RUE and RLE during exam  Extremities: Symmetric in appearance  bilaterally  Skin: No rashes, lesions or ulcers on exposed skin  Psychiatry: Judgement and insight appear stable. Mood & affect appropriate.     Data Reviewed: I have personally reviewed following labs and imaging studies  CBC: Recent Labs  Lab 06/22/19 0929 06/23/19 0456  WBC 9.1 8.3  NEUTROABS 5.6  --   HGB 11.7* 11.5*  HCT 33.1* 32.4*  MCV 89.7 89.8  PLT 270 [id number]   Basic Metabolic Panel: Recent Labs  Lab 06/22/19 0929 06/23/19 0005 06/23/19 0456  NA 124* 128* 129*  K 3.7 2.8* 3.6  CL 88* 98 92*  CO2 23 20* 25  GLUCOSE 133* 151* 255*  BUN 9 7* 8  CREATININE 1.02 0.75 1.07  CALCIUM 8.7* 7.3* 8.8*   GFR: Estimated Creatinine Clearance: 66.2 mL/min (by C-G formula based on SCr of 1.07 mg/dL). Liver Function Tests: Recent Labs  Lab 06/22/19 0929  AST 21  ALT 17  ALKPHOS 55  BILITOT 0.8  PROT 6.2*  ALBUMIN 3.4*   No results for input(s): LIPASE, AMYLASE in the last 168 hours. No results for input(s): AMMONIA in the last 168 hours. Coagulation Profile: No results for input(s): INR, PROTIME in the last 168 hours. Cardiac Enzymes: No results for input(s): CKTOTAL, CKMB, CKMBINDEX, TROPONINI in the last 168 hours. BNP (last 3 results) No results for input(s): PROBNP in the last 8760 hours. HbA1C: No results for input(s): HGBA1C in the last 72 hours. CBG: Recent Labs  Lab 06/23/19 0608 06/23/19 1228 06/23/19 1635 06/23/19 2122 06/24/19 0556  GLUCAP 217* 245* 233* 338* 306*   Lipid Profile: No results for input(s): CHOL, HDL, LDLCALC, TRIG, CHOLHDL, LDLDIRECT in the last 72 hours. Thyroid Function Tests: Recent Labs    06/23/19 0005  TSH 1.936   Anemia Panel: Recent Labs    06/23/19 0456  VITAMINB12 1,343*  TIBC 337  IRON 42*   Sepsis Labs: No results for input(s): PROCALCITON, LATICACIDVEN in the last 168 hours.  Recent Results (from the past 240 hour(s))  CSF culture     Status: None (Preliminary result)   Collection Time: 06/22/19   2:12 PM   Specimen: Back; Cerebrospinal Fluid  Result Value Ref Range Status   Specimen Description BACK  Final   Special Requests NONE  Final   Gram Stain   Final    NO WBC SEEN NO ORGANISMS SEEN CYTOSPIN SMEAR Performed at [facility], [address] [place], [place] [address]    Culture NO GROWTH 2 DAYS  Final   Report Status PENDING  Incomplete      Radiology Studies: CT CHEST W CONTRAST  Result Date: 06/22/2019 CLINICAL DATA:  Ataxia. EXAM: CT CHEST, ABDOMEN, AND PELVIS WITH CONTRAST TECHNIQUE: Multidetector CT imaging of the chest, abdomen and pelvis was performed following the standard protocol during bolus administration of intravenous contrast. CONTRAST:  140mL OMNIPAQUE IOHEXOL 300 MG/ML  SOLN COMPARISON:  None. FINDINGS: CT CHEST FINDINGS Cardiovascular: No significant vascular findings. Normal heart size. No pericardial effusion. Mild to moderate severity coronary artery calcification is seen. Mediastinum/Nodes: No enlarged mediastinal, hilar, or axillary lymph nodes. Thyroid  gland, trachea, and esophagus demonstrate no significant findings. Lungs/Pleura: Mild atelectasis is seen along the posterior aspect of the left upper lobe and posterior aspects of the bilateral lower lobes. There is no evidence of a pleural effusion or pneumothorax. Musculoskeletal: No chest wall mass or suspicious bone lesions identified. CT ABDOMEN PELVIS FINDINGS Hepatobiliary: No focal liver abnormality is seen. Status post cholecystectomy. No biliary dilatation. Pancreas: Unremarkable. No pancreatic ductal dilatation or surrounding inflammatory changes. Spleen: Normal in size without focal abnormality. Adrenals/Urinary Tract: Adrenal glands are unremarkable. Kidneys are normal, without renal calculi, focal lesion, or hydronephrosis. Bladder is unremarkable. Stomach/Bowel: Stomach is within normal limits. The appendix is not identified. No evidence of bowel wall thickening, distention, or inflammatory  changes. Noninflamed diverticula are seen within the descending and sigmoid colon. Vascular/Lymphatic: Mild aortic atherosclerosis. No enlarged abdominal or pelvic lymph nodes. Reproductive: There is moderate to marked severity prostate gland enlargement. Other: No abdominal wall hernia or abnormality. No abdominopelvic ascites. Musculoskeletal: No acute or significant osseous findings. IMPRESSION: 1. No evidence of acute abnormalities within the chest, abdomen or pelvis. 2. Mild to moderate severity coronary artery calcification. 3. Noninflamed diverticula of the descending and sigmoid colon. 4. Moderate to marked severity prostate gland enlargement. Please correlate to PSA values. Aortic Atherosclerosis (ICD10-I70.0). Electronically Signed   By: Virgina Norfolk M.D.   On: 06/22/2019 22:20   MR BRAIN W WO CONTRAST  Result Date: 06/22/2019 CLINICAL DATA:  Focal neuro deficit with stroke suspected. EXAM: MRI HEAD WITHOUT AND WITH CONTRAST TECHNIQUE: Multiplanar, multiecho pulse sequences of the brain and surrounding structures were obtained without and with intravenous contrast. CONTRAST:  45mL GADAVIST GADOBUTROL 1 MMOL/ML IV SOLN COMPARISON:  Head CT earlier today FINDINGS: Brain: No acute infarction, hemorrhage, hydrocephalus, extra-axial collection or mass lesion. Cystic intensity in the left thalamus which is very rounded and long-standing. Given the shape and lack of adjacent gliosis suspect this is a dilated perivascular space or neuroglial cyst. Slight enlargement from before is non worrisome. There has been progression of chronic small vessel ischemia in the cerebral white matter and a generalized decrease in cerebral volume. No abnormal intracranial enhancement. Vascular: Normal flow voids and vascular enhancements Skull and upper cervical spine: Normal marrow signal Sinuses/Orbits: Retention cysts in the left maxillary sinus. IMPRESSION: Aging of the brain since 2015 with no acute or reversible  finding. Electronically Signed   By: Monte Fantasia M.D.   On: 06/22/2019 12:52   CT ABDOMEN PELVIS W CONTRAST  Result Date: 06/22/2019 CLINICAL DATA:  Ataxia EXAM: CT CHEST, ABDOMEN, AND PELVIS WITH CONTRAST TECHNIQUE: Multidetector CT imaging of the chest, abdomen and pelvis was performed following the standard protocol during bolus administration of intravenous contrast. CONTRAST:  11mL OMNIPAQUE IOHEXOL 300 MG/ML  SOLN COMPARISON:  March 23, 2015 FINDINGS: CT CHEST FINDINGS Cardiovascular: No significant vascular findings. Normal heart size. No pericardial effusion. Mild to moderate severity coronary artery calcification is seen. Mediastinum/Nodes: No enlarged mediastinal, hilar, or axillary lymph nodes. Thyroid gland, trachea, and esophagus demonstrate no significant findings. Lungs/Pleura: Mild atelectasis is seen along the posterior aspect of the left upper lobe and posterior aspects of the bilateral lower lobes. There is no evidence of a pleural effusion or pneumothorax. Musculoskeletal: No chest wall mass or suspicious bone lesions identified. CT ABDOMEN PELVIS FINDINGS Hepatobiliary: No focal liver abnormality is seen. Status post cholecystectomy. No biliary dilatation. Pancreas: Unremarkable. No pancreatic ductal dilatation or surrounding inflammatory changes. Spleen: Normal in size without focal abnormality. Adrenals/Urinary Tract: Adrenal glands are  unremarkable. Kidneys are normal, without renal calculi, focal lesion, or hydronephrosis. Bladder is unremarkable. Stomach/Bowel: Stomach is within normal limits. The appendix is not identified. No evidence of bowel wall thickening, distention, or inflammatory changes. Noninflamed diverticula are seen within the descending and sigmoid colon. Vascular/Lymphatic: Mild aortic atherosclerosis. No enlarged abdominal or pelvic lymph nodes. Reproductive: There is moderate to marked severity prostate gland enlargement. Other: No abdominal wall hernia or  abnormality. No abdominopelvic ascites. Musculoskeletal: No acute or significant osseous findings. IMPRESSION: 1. No evidence of acute abnormalities within the chest, abdomen or pelvis. 2. Mild to moderate severity coronary artery calcification. 3. Noninflamed diverticula of the descending and sigmoid colon. 4. Moderate to marked severity prostate gland enlargement. Please correlate to PSA values. Aortic Atherosclerosis (ICD10-I70.0). Electronically Signed   By: Virgina Norfolk M.D.   On: 06/22/2019 22:18   DG FLUORO GUIDE LUMBAR PUNCTURE  Result Date: 06/22/2019 CLINICAL DATA:  79 year old male with choreiform movements. Evaluate CSF. EXAM: DIAGNOSTIC LUMBAR PUNCTURE UNDER FLUOROSCOPIC GUIDANCE FLUOROSCOPY TIME:  Fluoroscopy Time:  0.5 minutes Number of Acquired Spot Images: 1 PROCEDURE: Informed consent was obtained from the patient prior to the procedure, including potential complications of headache, allergy, and pain. With the patient prone, the lower back was prepped with Betadine. 1% Lidocaine was used for local anesthesia. Lumbar puncture was performed at the L3-4 level using a 20 gauge needle with return of clear colorless CSF with an opening pressure of 16 cm water. Ten ml of CSF were obtained for laboratory studies. The patient tolerated the procedure well and there were no apparent complications. IMPRESSION: Fluoroscopic guided lumbar puncture.  No immediate complication. Electronically Signed   By: Margarette Canada M.D.   On: 06/22/2019 17:33      Scheduled Meds: . aspirin EC  81 mg Oral Daily  . enoxaparin (LOVENOX) injection  40 mg Subcutaneous Q24H  . famotidine  20 mg Oral Daily  . finasteride  5 mg Oral QHS  . lisinopril  20 mg Oral Daily   And  . hydrochlorothiazide  25 mg Oral Daily  . insulin aspart  0-15 Units Subcutaneous TID WC  . insulin aspart  5 Units Subcutaneous TID WC  . insulin glargine  15 Units Subcutaneous BID  . tamsulosin  0.4 mg Oral QPC supper   Continuous  Infusions: . sodium chloride 100 mL/hr at 06/23/19 2303  . methylPREDNISolone (SOLU-MEDROL) injection 1,000 mg (06/24/19 0906)     LOS: 2 days      Time spent: 20 minutes   Dessa Phi, DO [facility] 06/24/2019, 11:19 AM   Available via Epic secure chat 7am-7pm After these hours, please refer to coverage provider listed on [email or web address]

## 2019-06-24 NOTE — Progress Notes (Signed)
Inpatient Diabetes Program Recommendations  AACE/ADA: New Consensus Statement on Inpatient Glycemic Control   Target Ranges:  Prepandial:   less than 140 mg/dL      Peak postprandial:   less than 180 mg/dL (1-2 hours)      Critically ill patients:  140 - 180 mg/dL   Results for OLAMIPOSI, TOPP (MRN [id number]) as of 06/24/2019 10:05  Ref. Range 06/23/2019 06:08 06/23/2019 12:28 06/23/2019 16:35 06/23/2019 21:22 06/24/2019 05:56  Glucose-Capillary Latest Ref Range: 70 - 99 mg/dL 217 (H) 245 (H) 233 (H) 338 (H) 306 (H)   Review of Glycemic Control  Diabetes history: DM2 Outpatient Diabetes medications: Glipizide XL 10 mg daily, Regular 2-10 units TID with meals, Metformin 1000 mg BID Current orders for Inpatient glycemic control: Lantus 15 units QHS, Novolog 0-15 units TID with meals, Novolog 0-5 units QHS; Solumedrol 1000 mg Q24H  Inpatient Diabetes Program Recommendations:   Insulin - Basal: If steroids are continued, please consider increasing Lantus to 15 units BID (to start now).  Insulin - Meal Coverage: If steroids are continued, please consider ordering Novolog 5 units TID with meals for meal coverage if patient eats at least 50% of meals.  Thanks, Barnie Alderman, RN, MSN, CDE Diabetes Coordinator Inpatient Diabetes Program [phone] (Team Pager from 8am to 5pm)

## 2019-06-24 NOTE — Progress Notes (Addendum)
Subjective: Feels some improvement.   Exam: Vitals:   06/24/19 0908 06/24/19 1151  BP: (!) 151/82 (!) 125/113  Pulse: 97 (!) 105  Resp: (!) 24 (!) 24  Temp: 98.1 F (36.7 C) 98.6 F (37 C)  SpO2: 99% 98%   Gen: In bed, NAD Resp: non-labored breathing, no acute distress Abd: soft, nt  Neuro: MS: awake, alert, oriented [id number], EOMI Motor: good strenght, but persitetn choreaform movments of the right amr and leg, none on left.  Sensory:intact to LT  Pertinent Labs: CMP -  NA 129 Glucose 255  TSH 1.9  CSF: RBC 0  WBC 1 Protein 35 Glucose 56 VDRL - pneding.   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  Covid has only been rarely associated with movement disorders, but the timing is suggestive there may have been some association(he reports movements beginning about 2 weeks after diagnosis).    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  I do wonder if he may have been predisposed to an autoimmune cause of chorea which was invoked by the Covid diagnosis rather than Covid causing the process directly.   Recommendations: 1) continue Solu-Medrol 2) Send Autoimmune movement disorder panel on CSF to [facility](includes CRMP5 and ANNA1(Hu) 3) neurology will continue to follow  Roland Rack, MD [facility] [phone]  If 7pm- 7am, please page neurology on call as listed in [place].

## 2019-06-25 LAB — BASIC METABOLIC PANEL
Anion gap: 10 (ref 5–15)
BUN: 21 mg/dL (ref 8–23)
CO2: 25 mmol/L (ref 22–32)
Calcium: 8.4 mg/dL — ABNORMAL LOW (ref 8.9–10.3)
Chloride: 102 mmol/L (ref 98–111)
Creatinine, Ser: 1.07 mg/dL (ref 0.61–1.24)
GFR calc Af Amer: >60 mL/min (ref >60)
GFR calc non Af Amer: >60 mL/min (ref >60)
Glucose, Bld: 308 mg/dL — ABNORMAL HIGH (ref 70–99)
Potassium: 3.7 mmol/L (ref 3.5–5.1)
Sodium: 137 mmol/L (ref 135–145)

## 2019-06-25 LAB — FOLATE RBC
Folate, Hemolysate: 483 ng/mL
Folate, RBC: 1528 ng/mL (ref >498)
Hematocrit: 31.6 % — ABNORMAL LOW (ref 37.5–51.0)

## 2019-06-25 LAB — GLUCOSE, CAPILLARY
Glucose-Capillary: 273 mg/dL — ABNORMAL HIGH (ref 70–99)
Glucose-Capillary: 266 mg/dL — ABNORMAL HIGH (ref 70–99)
Glucose-Capillary: 286 mg/dL — ABNORMAL HIGH (ref 70–99)

## 2019-06-25 MED ORDER — INSULIN GLARGINE 100 UNIT/ML ~~LOC~~ SOLN
20.0000 [IU] | Freq: Two times a day (BID) | SUBCUTANEOUS | Status: DC
  Administered 2019-06-25 (×2): 20 [IU] via SUBCUTANEOUS
  Filled 2019-06-25 (×4): qty 0.2

## 2019-06-25 MED ORDER — SENNOSIDES-DOCUSATE SODIUM 8.6-50 MG PO TABS: 1.0000 | ORAL_TABLET | Freq: Every evening | ORAL | Status: DC | PRN

## 2019-06-25 MED ORDER — POLYETHYLENE GLYCOL 3350 17 G PO PACK
17.0000 g | PACK | Freq: Every day | ORAL | Status: DC
  Administered 2019-06-25 – 2019-06-26 (×2): 17 g via ORAL
  Filled 2019-06-25 (×2): qty 1

## 2019-06-25 NOTE — Progress Notes (Signed)
Subjective: Reports he is much improved  Exam: Vitals:   06/25/19 1128 06/25/19 1603  BP: (!) 151/84 139/83  Pulse: 77 82  Resp: 15 19  Temp: 98.2 F (36.8 C) 98.5 F (36.9 C)  SpO2: 99% 98%   Gen: In bed, NAD Resp: non-labored breathing, no acute distress Abd: soft, nt  Neuro: MS: awake, alert, oriented [id number], EOMI Motor: Choreaform movements are much improved on the right., still present at low amplitude.  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  Covid has only been rarely associated with movement disorders, but the timing is suggestive there may have been some association(he reports movements beginning about 2 weeks after diagnosis).    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  I do wonder if he may have been predisposed to an autoimmune cause of chorea which was invoked byCovid  rather than Covid causing the process directly. The response to steroids is suggestive of an autoimmune cause.    Recommendations: 1) continue Solu-Medrol 2) Autoimmune movement disorder panel on CSF to [facility](includes CRMP5 and ANNA1(Hu)  3) neurology will continue to follow  Roland Rack, MD [facility] [phone]  If 7pm- 7am, please page neurology on call as listed in [place].

## 2019-06-25 NOTE — Care Management Important Message (Signed)
Important Message  Patient Details  Name: Luke Smaw MRN: [id number] Date of Birth: 1940/07/03   Medicare Important Message Given:  Yes     Jackie Littlejohn Montine Circle 06/25/2019, 2:56 PM

## 2019-06-25 NOTE — Progress Notes (Signed)
PROGRESS NOTE    Luke Edwards  [id number] DOB: 1940-06-08 DOA: 06/22/2019 PCP: Bonnita Nasuti, MD     Brief Narrative:  Luke Edwards is a 79 y.o. male with medical history significant for hypertension, hyperlipidemia, insulin-dependent type 2 diabetes, recent COVID pneumonia (positive on 1/18) requiring hospitalization who presents with concerns of acute onset of Chorea like movements.   He had abrupt onset of choreiform movements of the right upper and lower extremity starting 2 days ago.  Wife also told him that he seems to be "acting funny" and was having rapid speech.  He denies any family or personal history of anything similar.  Denies any trauma.  He denies any tobacco or illicit drug use.  Has occasional alcohol use every Wednesday.  He reports about a 30 pound weight loss but that was due to him being hospitalized for Covid.  He has had issues with hyperglycemia post-Covid.   At time of admission, neurology was consulted for chorea. He underwent LP, MRI brain, CT C/A/P without acute findings to explain his symptoms.  He was started on IV Solu-Medrol.  New events last 24 hours / Subjective: Continues to report some improvement in choreiform movement of the right upper and right lower extremities.  Blood sugars remain elevated.  Assessment & Plan:   Principal Problem:   Chorea Active Problems:   COVID-19 virus infection   HTN (hypertension)   Hyponatremia   Anemia   Type 2 diabetes mellitus with hyperlipidemia (HCC)    Choreiform movements -Neurology following -CT C/A/P negative  -MRI brain negative -LP negative  -Per neurology, examination is consistent with basal ganglia pathology, does not appear to be psychogenic. Negative MRI does not rule out basal ganglia autoimmune pathology. Most likely etiology felt to be autoimmune in nature. Anti CRMP5 antibody, Anti-Hu antibody titer pending -Solumedrol 1g daily x 5 days, 3/14-3/18  Hyponatremia -Resolved.  Stop  IV fluid  Type 2 diabetes with hyperglycemia -Hold home metformin, glipizide -Lantus, SSI. Insulin dose adjusted today. Close monitoring while getting IV solumedrol.   Hypertension  -Continue Lisinopril-HCTZ  Hx of COVID infection  -Tested positive on 1/18. No repeat testing recommend since it is still within 3 months and he is not here for COVID symptoms   BPH -Continue proscar, flomax    DVT prophylaxis: Lovenox  Code Status: Full code Family Communication: None at bedside Disposition Plan:  . Patient is from home prior to admission.  . Currently in-hospital treatment needed due to IV Solu-Medrol started 3/14 for 5-day course . Suspect patient will discharge back home once cleared from neurology standpoint and completed IV solumedrol.     Consultants:   Neurology  Procedures:   LP 3/13  Antimicrobials:  Anti-infectives (From admission, onward)   None       Objective: Vitals:   06/24/19 2117 06/24/19 2311 06/25/19 0423 06/25/19 0909  BP: 138/80 131/77 130/80 (!) 144/74  Pulse: 87 66 69 99  Resp: 16 18 18 20   Temp: 98.2 F (36.8 C) 97.6 F (36.4 C) 97.7 F (36.5 C) 98.1 F (36.7 C)  TempSrc: Oral Oral Oral Oral  SpO2: 97% 98% 97% 99%  Weight:      Height:        Intake/Output Summary (Last 24 hours) at 06/25/2019 1033 Last data filed at 06/25/2019 0910 Gross per 24 hour  Intake 3694.52 ml  Output --  Net 3694.52 ml   Filed Weights   06/23/19 0050  Weight: 85.5 kg    Examination:  General exam: Appears calm and comfortable  Respiratory system: Clear to auscultation. Respiratory effort normal. Cardiovascular system: S1 & S2 heard, RRR. No pedal edema. Gastrointestinal system: Abdomen is nondistended, soft and nontender. Normal bowel sounds heard. Central nervous system: Alert and oriented. + Continued choreiform movements of the right upper and right lower extremities Extremities: Symmetric in appearance bilaterally  Skin: No rashes, lesions  or ulcers on exposed skin  Psychiatry: Judgement and insight appear stable. Mood & affect appropriate.    Data Reviewed: I have personally reviewed following labs and imaging studies  CBC: Recent Labs  Lab 06/22/19 0929 06/23/19 0456  WBC 9.1 8.3  NEUTROABS 5.6  --   HGB 11.7* 11.5*  HCT 33.1* 32.4*  MCV 89.7 89.8  PLT 270 [id number]   Basic Metabolic Panel: Recent Labs  Lab 06/22/19 0929 06/23/19 0005 06/23/19 0456 06/24/19 1207 06/25/19 0330  NA 124* 128* 129* 132* 137  K 3.7 2.8* 3.6 3.3* 3.7  CL 88* 98 92* 98 102  CO2 23 20* 25 23 25   GLUCOSE 133* 151* 255* 239* 308*  BUN 9 7* 8 17 21   CREATININE 1.02 0.75 1.07 1.16 1.07  CALCIUM 8.7* 7.3* 8.8* 8.6* 8.4*   GFR: Estimated Creatinine Clearance: 66.2 mL/min (by C-G formula based on SCr of 1.07 mg/dL). Liver Function Tests: Recent Labs  Lab 06/22/19 0929  AST 21  ALT 17  ALKPHOS 55  BILITOT 0.8  PROT 6.2*  ALBUMIN 3.4*   No results for input(s): LIPASE, AMYLASE in the last 168 hours. No results for input(s): AMMONIA in the last 168 hours. Coagulation Profile: No results for input(s): INR, PROTIME in the last 168 hours. Cardiac Enzymes: No results for input(s): CKTOTAL, CKMB, CKMBINDEX, TROPONINI in the last 168 hours. BNP (last 3 results) No results for input(s): PROBNP in the last 8760 hours. HbA1C: No results for input(s): HGBA1C in the last 72 hours. CBG: Recent Labs  Lab 06/24/19 0556 06/24/19 1155 06/24/19 1637 06/24/19 2120 06/25/19 0607  GLUCAP 306* 218* 395* 344* 273*   Lipid Profile: No results for input(s): CHOL, HDL, LDLCALC, TRIG, CHOLHDL, LDLDIRECT in the last 72 hours. Thyroid Function Tests: Recent Labs    06/23/19 0005  TSH 1.936   Anemia Panel: Recent Labs    06/23/19 0456  VITAMINB12 1,343*  TIBC 337  IRON 42*   Sepsis Labs: No results for input(s): PROCALCITON, LATICACIDVEN in the last 168 hours.  Recent Results (from the past 240 hour(s))  CSF culture     Status:  None (Preliminary result)   Collection Time: 06/22/19  2:12 PM   Specimen: Back; Cerebrospinal Fluid  Result Value Ref Range Status   Specimen Description BACK  Final   Special Requests NONE  Final   Gram Stain NO WBC SEEN NO ORGANISMS SEEN CYTOSPIN SMEAR   Final   Culture   Final    NO GROWTH 3 DAYS Performed at [facility], [address] [place], [place] [address]    Report Status PENDING  Incomplete      Radiology Studies: No results found.    Scheduled Meds: . aspirin EC  81 mg Oral Daily  . enoxaparin (LOVENOX) injection  40 mg Subcutaneous Q24H  . famotidine  20 mg Oral Daily  . feeding supplement (GLUCERNA SHAKE)  237 mL Oral TID BM  . finasteride  5 mg Oral QHS  . lisinopril  20 mg Oral Daily   And  . hydrochlorothiazide  25 mg Oral Daily  .  insulin aspart  0-15 Units Subcutaneous TID WC  . insulin aspart  5 Units Subcutaneous TID WC  . insulin glargine  20 Units Subcutaneous BID  . multivitamin with minerals  1 tablet Oral Daily  . polyethylene glycol  17 g Oral Daily  . tamsulosin  0.4 mg Oral QPC supper   Continuous Infusions: . methylPREDNISolone (SOLU-MEDROL) injection 1,000 mg (06/25/19 0956)     LOS: 3 days      Time spent: 20 minutes   Dessa Phi, DO [facility] 06/25/2019, 10:33 AM   Available via Epic secure chat 7am-7pm After these hours, please refer to coverage provider listed on [email or web address]

## 2019-06-26 LAB — GLUCOSE, CAPILLARY
Glucose-Capillary: 272 mg/dL — ABNORMAL HIGH (ref 70–99)
Glucose-Capillary: 218 mg/dL — ABNORMAL HIGH (ref 70–99)
Glucose-Capillary: 193 mg/dL — ABNORMAL HIGH (ref 70–99)
Glucose-Capillary: 349 mg/dL — ABNORMAL HIGH (ref 70–99)
Glucose-Capillary: 326 mg/dL — ABNORMAL HIGH (ref 70–99)

## 2019-06-26 LAB — CSF CULTURE W GRAM STAIN
Culture: NO GROWTH
Gram Stain: NONE SEEN

## 2019-06-26 LAB — BASIC METABOLIC PANEL
Anion gap: 12 (ref 5–15)
BUN: 26 mg/dL — ABNORMAL HIGH (ref 8–23)
CO2: 25 mmol/L (ref 22–32)
Calcium: 8.5 mg/dL — ABNORMAL LOW (ref 8.9–10.3)
Chloride: 98 mmol/L (ref 98–111)
Creatinine, Ser: 0.99 mg/dL (ref 0.61–1.24)
GFR calc Af Amer: >60 mL/min (ref >60)
GFR calc non Af Amer: >60 mL/min (ref >60)
Glucose, Bld: 244 mg/dL — ABNORMAL HIGH (ref 70–99)
Potassium: 3.8 mmol/L (ref 3.5–5.1)
Sodium: 135 mmol/L (ref 135–145)

## 2019-06-26 MED ORDER — INSULIN GLARGINE 100 UNIT/ML ~~LOC~~ SOLN
25.0000 [IU] | Freq: Two times a day (BID) | SUBCUTANEOUS | Status: DC
  Administered 2019-06-26 – 2019-06-27 (×3): 25 [IU] via SUBCUTANEOUS
  Filled 2019-06-26 (×4): qty 0.25

## 2019-06-26 NOTE — Progress Notes (Signed)
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  Covid has only been rarely associated with movement disorders, but the timing is suggestive there may have been some association.    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  I do wonder if he may have been predisposed to an autoimmune cause of chorea which was invoked by Covid  rather than Covid causing the process directly.  He has had a very dramatic response to Solu-Medrol, but tomorrow being the final day of a 5-day pulse dose.  Given the relatively short duration, and good response so far, I think I would favor a tapering course of prednisone as I am not certain if he will need this over the long-term or not.  If it returns, then further immunosuppression will need to be considered.   Recommendations: 1) continue Solu-Medrol, day for 5 2) Autoimmune movement disorder panel on [facility] to [facility] is pending 3) neurology will continue to follow  Roland Rack, MD [facility] [phone]  If 7pm- 7am, please page neurology on call as listed in [place].

## 2019-06-26 NOTE — Progress Notes (Signed)
PROGRESS NOTE  Luke Edwards [id number] DOB: Mar 30, 1941 DOA: 06/22/2019 PCP: Bonnita Nasuti, MD  Brief History   Luke Edwards a 79 y.o.malewith medical history significant forhypertension, hyperlipidemia, insulin-dependent type 2 diabetes, recent COVIDpneumonia(positive on 1/18)requiring hospitalization who presents with concerns of acute onset of Chorea like movements.   He hadabrupt onset of choreiform movements of the right upper and lower extremity starting 2 days ago. Wife also told him that he seems to be "acting funny" and was having rapid speech. He denies any family or personal history of anything similar. Denies any trauma. He denies any tobacco or illicit drug use. Has occasional alcohol use every Wednesday. He reports about a 30 pound weight loss but that was due to him being hospitalized for Covid. He has had issues with hyperglycemia post-Covid.   At time of admission, neurology was consulted for chorea. He underwent LP, MRI brain, CT C/A/P without acute findings to explain his symptoms.  He was started on IV Solu-Medrol which is to be continued for 5 days, ending tomorrow followed by a steroid taper at home.  Consultants  . Neurology  Procedures  . None  Antibiotics   Anti-infectives (From admission, onward)   None    .  Subjective  The patient is resting comfortably. No new complaints.  Objective   Vitals:  Vitals:   06/26/19 1209 06/26/19 1559  BP: 139/70 (!) 142/78  Pulse: 80 89  Resp: 17 18  Temp: 98.2 F (36.8 C) 98.3 F (36.8 C)  SpO2: 96% 97%    Exam:  Constitutional:  . The patient is awake, alert, and oriented x 3. No acute distress. Respiratory:  . No increased work of breathing. . No wheezes, rales, or rhonchi . No tactile fremitus Cardiovascular:  . Regular rate and rhythm . No murmurs, ectopy, or gallups. . No lateral PMI. No thrills. Abdomen:  . Abdomen is soft, non-tender, non-distended . No hernias,  masses, or organomegaly . Normoactive bowel sounds.  Musculoskeletal:  . No cyanosis, clubbing, or edema Skin:  . No rashes, lesions, ulcers . palpation of skin: no induration or nodules Neurologic:  . CN 2-12 intact . Sensation all 4 extremities intact . Pt states that his movements have resolved, but I perceive small choreiform movements of the trunk. Psychiatric:  . Mental status o Mood, affect appropriate o Orientation to person, place, time  . judgment and insight appear intact  I have personally reviewed the following:   Today's Data  . Vitals, BMP, Glucoses  Scheduled Meds: . aspirin EC  81 mg Oral Daily  . enoxaparin (LOVENOX) injection  40 mg Subcutaneous Q24H  . famotidine  20 mg Oral Daily  . feeding supplement (GLUCERNA SHAKE)  237 mL Oral TID BM  . finasteride  5 mg Oral QHS  . lisinopril  20 mg Oral Daily   And  . hydrochlorothiazide  25 mg Oral Daily  . insulin aspart  0-15 Units Subcutaneous TID WC  . insulin aspart  5 Units Subcutaneous TID WC  . insulin glargine  25 Units Subcutaneous BID  . multivitamin with minerals  1 tablet Oral Daily  . polyethylene glycol  17 g Oral Daily  . tamsulosin  0.4 mg Oral QPC supper   Continuous Infusions: . methylPREDNISolone (SOLU-MEDROL) injection 1,000 mg (06/26/19 1032)    Principal Problem:   Chorea Active Problems:   COVID-19 virus infection   HTN (hypertension)   Hyponatremia   Anemia   Type 2 diabetes mellitus with hyperlipidemia ([place])  LOS: 4 days   A & P   Choreiform movements: I appreciate neurology's assistance. CT C/A/P negative. MRI brain negative. LP negative. Per neurology, examination is consistent with basal ganglia pathology, does not appear to be psychogenic. Negative MRI does not rule out basal ganglia autoimmune pathology. Most likely etiology felt to be autoimmune in nature. Anti CRMP5 antibody, Anti-Hu antibody titer pending. Neurology has recommended Solumedrol 1g daily x 5 days,  3/14-3/18.  Hyponatremia: Resolved.   Type 2 diabetes with hyperglycemia: Hold home metformin, glipizide. Glucoses are increased due to IV solumedrol. Lantus, SSI. Insulin dose adjusted today.  Hypertension: Continue Lisinopril-HCTZ  Hx of COVID infection: Tested positive on 1/18. No repeat testing recommend since it is still within 3 months and he is not here for COVID symptoms.  BPH: Continue proscar, flomax   I have seen and examined this patient myself. I have spent 34 minutes in her evaluation and care.  DVT prophylaxis: Lovenox  Code Status: Full code Family Communication: None at bedside Disposition Plan:   Patient is from home prior to admission.   Currently in-hospital treatment needed due to IV Solu-Medrol started 3/14 for 5-day course  Suspect patient will discharge back home once cleared from neurology standpoint and completed IV solumedrol.    Quentina Fronek, DO [facility] Direct contact: see [email or web address]  7PM-7AM contact night coverage as above 06/26/2019, 6:35 PM  LOS: 4 days

## 2019-06-26 NOTE — Progress Notes (Signed)
Physical Therapy Treatment Patient Details Name: Luke Edwards MRN: [id number] DOB: Mar 13, 1941 Today's Date: 06/26/2019    History of Present Illness 79 year old male admitted 06/22/19 with sudden onset choreic movements RUE and RLE. Neurology consult 06/22/19 and question of autoimmune process or basal ganglia pathology. 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 LP negative. CT chest/abd/pelvis negative. Patient with recent COVID illness (positive test 04/29/19). 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      PT Comments    Pt seated in chair on arrival.  Pt performing transfers with independence and gt is coming along nicely.  He does present with minor deviations and requires supervision for safety.  He is toileting in room with supervision from his spouse.  Continue to recommend HHPT to improve strength and function before returning home.     Follow Up Recommendations  Home health PT;Supervision for mobility/OOB     Equipment Recommendations  (Pt has SPC for home use.)    Recommendations for Other Services       Precautions / Restrictions Precautions Precautions: Fall;Other (comment) Precaution Comments: R hemichorea Restrictions Weight Bearing Restrictions: No    Mobility  Bed Mobility               General bed mobility comments: Pt seated in recliner.  Transfers Overall transfer level: Modified independent Equipment used: Straight cane(checked cane for correct patient fit.  Kasandra Knudsen is appropriate.) Transfers: Sit to/from Stand Sit to Stand:  Modified independent (Device/Increase time)         General transfer comment: Pt safe with movement from chair to standing.  No assistance needed and good safety with SPC.  Ambulation/Gait Ambulation/Gait assistance: Supervision Gait Distance (Feet): 300 Feet Assistive device: Straight cane Gait Pattern/deviations: Step-through pattern;Staggering right     General Gait Details: Pt required supervision for safety.  Minor staggering to the R but able to correct with use of SPC.  No LOB noted.   Stairs             Wheelchair Mobility    Modified Rankin (Stroke Patients Only)       Balance Overall balance assessment: Mild deficits observed, not formally tested(no overt LOB)                                          Cognition Arousal/Alertness: Awake/alert Behavior During Therapy: Impulsive Overall Cognitive Status: Within Functional Limits for tasks assessed                                 General Comments: move quickly, cues for safety      Exercises General Exercises - Lower Extremity Hip ABduction/ADduction: AROM;Both;10 reps;Standing Hip Flexion/Marching: AROM;Both;10 reps;Standing Heel Raises: AROM;Both;10 reps;Standing Mini-Sqauts: AROM;Both;10 reps;Standing    General Comments        Pertinent Vitals/Pain      Home Living  Prior Function            PT Goals (current goals can now be found in the care plan section) Acute Rehab PT Goals Patient Stated Goal: to do everything he did before Potential to Achieve Goals: Good Progress towards PT goals: Progressing toward goals    Frequency    Min 3X/week      PT Plan Current plan remains appropriate    Co-evaluation              AM-PAC PT "6 Clicks" Mobility   Outcome Measure  Help needed turning from your back to your side while in a flat bed without using bedrails?: None Help needed moving from lying on your back to  sitting on the side of a flat bed without using bedrails?: None Help needed moving to and from a bed to a chair (including a wheelchair)?: None Help needed standing up from a chair using your arms (e.g., wheelchair or bedside chair)?: None Help needed to walk in hospital room?: A Little Help needed climbing 3-5 steps with a railing? : A Little 6 Click Score: 22    End of Session Equipment Utilized During Treatment: Gait belt Activity Tolerance: Patient tolerated treatment well Patient left: in chair;with call bell/phone within reach;with chair alarm set Nurse Communication: Mobility status PT Visit Diagnosis: Unsteadiness on feet (R26.81);Other abnormalities of gait and mobility (R26.89);Difficulty in walking, not elsewhere classified (R26.2)     Time: [id number] PT Time Calculation (min) (ACUTE ONLY): 12 min  Charges:  $Gait Training: 8-22 mins                     Erasmo Leventhal , PTA Acute Rehabilitation Services Pager [phone] Office [phone]     Shakirah Kirkey Eli Hose 06/26/2019, 12:00 PM

## 2019-06-27 LAB — GLUCOSE, CAPILLARY: Glucose-Capillary: 211 mg/dL — ABNORMAL HIGH (ref 70–99)

## 2019-06-27 MED ORDER — INSULIN GLARGINE 100 UNIT/ML ~~LOC~~ SOLN: SUBCUTANEOUS | 3 refills | Status: AC

## 2019-06-27 MED ORDER — POLYETHYLENE GLYCOL 3350 17 G PO PACK: 17.0000 g | PACK | Freq: Every day | ORAL | 0 refills | Status: DC

## 2019-06-27 MED ORDER — PREDNISONE 10 MG PO TABS: ORAL_TABLET | ORAL | 0 refills | Status: AC

## 2019-06-27 MED ORDER — GLUCERNA SHAKE PO LIQD: 237.0000 mL | Freq: Three times a day (TID) | ORAL | 0 refills | Status: AC

## 2019-06-27 NOTE — Progress Notes (Signed)
Inpatient Diabetes Program Recommendations  AACE/ADA: New Consensus Statement on Inpatient Glycemic Control   Target Ranges:  Prepandial:   less than 140 mg/dL      Peak postprandial:   less than 180 mg/dL (1-2 hours)      Critically ill patients:  140 - 180 mg/dL   Results for Luke Edwards, Luke Edwards (MRN [id number]) as of 06/27/2019 10:57  Ref. Range 06/26/2019 06:08 06/26/2019 12:11 06/26/2019 16:58 06/26/2019 22:17 06/27/2019 06:44  Glucose-Capillary Latest Ref Range: 70 - 99 mg/dL 218 (H) 193 (H) 349 (H) 326 (H) 211 (H)   Review of Glycemic Control  Diabetes history: DM2 Outpatient Diabetes medications: Glipizide XL 10 mg daily, Regular 2-10 units TID with meals, Metformin 1000 mg BID Current orders for Inpatient glycemic control: Lantus 25 units BID, Novolog 0-15 units TID with meals, Novolog 5 units TID with meals; Solumedrol 1000 mg Q24H  Inpatient Diabetes Program Recommendations:   Insulin-Correction: Please consider ordering Novolog 0-5 units QHS for bedtime correction.  Insulin - Meal Coverage: If steroids are continued, please consider increasing meal coverage to Novolog 8 units TID with meals if patient eats at least 50% of meals.  Thanks, Barnie Alderman, RN, MSN, CDE Diabetes Coordinator Inpatient Diabetes Program [phone] (Team Pager from 8am to 5pm)

## 2019-06-27 NOTE — TOC Transition Note (Signed)
Transition of Care [facility]) - CM/SW Discharge Note   Patient Details  Name: Ranell Alyea MRN: [id number] Date of Birth: Mar 10, 1941  Transition of [facility]) CM/SW Contact:  Pollie Friar, RN Phone Number: 06/27/2019, 10:33 AM   Clinical Narrative:    Pt discharging home with family. Pt will have [place] services through [facility]. Britney with [facility] aware of d/c home today.  Pt has cane for home at the bedside.  Pt has supervision at home and transportation to home.    Final next level of care: Home w Home Health Services Barriers to Discharge: No Barriers Identified   Patient Goals and CMS Choice   CMS Medicare.gov Compare Post Acute Care list provided to:: Patient Choice offered to / list presented to : Patient, Adult Children  Discharge Placement                       Discharge Plan and Services   Discharge Planning Services: CM Consult Post Acute Care Choice: Home Health, Durable Medical Equipment          DME Arranged: Kasandra Knudsen DME Agency: [employer] Date DME Agency Contacted: 06/24/19 Time DME Agency Contacted: 60 Representative spoke with at DME Agency: [place] Arranged: PT, OT, RN [place] Agency: [facility] Date [place]: 06/24/19 Time [place]: 1600 Representative spoke with at [place]: [employer] made aware of d/c home today  Social Determinants of Health ([place]) Interventions     Readmission Risk Interventions No flowsheet data found.

## 2019-06-27 NOTE — Plan of Care (Signed)
Patient stable, discussed POC with patient, agreeable with plan, denies question/concerns at this time.  

## 2019-06-27 NOTE — Progress Notes (Signed)
Subjective: Feels essentially back to normal.  Exam: Vitals:   06/27/19 0440 06/27/19 0855  BP: 140/77 140/67  Pulse: (!) 53 71  Resp: 19 19  Temp: 98.3 F (36.8 C) 98.2 F (36.8 C)  SpO2: 96% 96%   Gen: In bed, NAD Resp: non-labored breathing, no acute distress Abd: soft, nt  Neuro: MS: awake, alert, oriented [id number], EOMI Motor: He appears silghtly figidity, but family states this is normal for him, but possibly some very mild chorea still.  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  Covid has only been rarely associated with movement disorders, but the timing is suggestive there may have been some association.    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  I do wonder if he may have been predisposed to an autoimmune cause of chorea which was invoked by Covid  rather than Covid causing the process directly.  He has had a very dramatic response to Solu-Medrol, but tomorrow being the final day of a 5-day pulse dose.  Given the relatively short duration, and good response so far, I think I would favor a tapering course of prednisone as I am not certain if he will need this over the long-term or not.  If it returns, then further immunosuppression will need to be considered.  Recommendations: 1) Prednisone taper from 60mg  to off over a couple of weeks(e.g. 60mg  x 3 days, 50x 3 days, etc... or similar) 2) Autoimmune movement disorder panel on CSF to [facility] is pending 3) neurology will continue to follow  Roland Rack, MD [facility] [phone]  If 7pm- 7am, please page  neurology on call as listed in [place].

## 2019-06-30 NOTE — Discharge Summary (Signed)
Physician Discharge Summary  Luke Edwards [id number] DOB: [birth year] DOA: 06/22/2019  PCP: Bonnita Nasuti, MD  Admit date: 06/22/2019 Discharge date: 06/30/2019  Recommendations for Outpatient Follow-up:  1. Discharge to home with PT/OT 2. No driving 3. Follow up with PCP in 7-10 days 4. Check glucoses twice a day. Once in the morning before breakfast and once in the evening before dinner. Record glucoses and take in to PCP visit. 5. Follow up with neurology as directed.  Follow-up Information    [facility] Follow up.   Why: The home health agency will contact you for the next home visit. Contact information: [phone]         Discharge Diagnoses: Principal diagnosis is #1 1. Choreiform movements 2. Hyponatremia 3. DM II 4. Hypertension 5. History of COVID infection 6. BPH  Discharge Condition: Fair  Disposition: Home  Diet recommendation: Heart healthy and modified carbohydrates  Filed Weights   06/23/19 0050  Weight: 85.5 kg   History of present illness: Luke Edwards is a 79 y.o. male with medical history significant for hypertension, hyperlipidemia, insulin-dependent type 2 diabetes, recent COVID pneumonia (positive on 1/18) requiring hospitalization who presents with concerns of acute onset of Chorea like movements.   He had abrupt onset of choreiform movements of the right upper and lower extremity starting 2 days ago.  Wife also told him that he seems to be "acting funny" and was having rapid speech.  He denies any family or personal history of anything similar.  Denies any trauma.  He denies any tobacco or illicit drug use.  Has occasional alcohol use every Wednesday.  He reports about a 30 pound weight loss but that was due to him being hospitalized for Covid.  Denies any night sweats.  He denies any nausea, vomiting or diarrhea.  No changes in his bowel movement and just had one this morning.  He has had low appetite but thinks it is due to  his recent Covid infection.  He uses a sliding scale for his insulin and lately has been "up and down" and sometimes it goes as high as 420 and other times down to 96.  Patient is mostly retired but works part-time at [employer].  Hospital Course:  explain his symptoms.He was started on IV Solu-Medrol which is to be continued for 5 days, ending tomorrow followed by a steroid taper at home.  Today's assessment: S: The patient is resting comfortably. No new complaints. O: Vitals:  Vitals:   06/27/19 0440 06/27/19 0855  BP: 140/77 140/67  Pulse: (!) 53 71  Resp: 19 19  Temp: 98.3 F (36.8 C) 98.2 F (36.8 C)  SpO2: 96% 96%   Exam:  Constitutional:  . The patient is awake, alert, and oriented x 3. No acute distress. Eyes:  . pupils and irises appear normal . Normal lids and conjunctivae ENMT:  . grossly normal hearing  . Lips appear normal . external ears, nose appear normal . Oropharynx: mucosa, tongue,posterior pharynx appear normal Neck:  . neck appears normal, no masses, normal ROM, supple . no thyromegaly Respiratory:  . No increased work of breathing. . No wheezes, rales, or rhonchi . No tactile fremitus Cardiovascular:  . Regular rate and rhythm . No murmurs, ectopy, or gallups. . No lateral PMI. No thrills. Abdomen:  . Abdomen is soft, non-tender, non-distended . No hernias, masses, or organomegaly . Normoactive bowel sounds.  Musculoskeletal:  . No cyanosis, clubbing, or edema Skin:  . No rashes, lesions,  ulcers . palpation of skin: no induration or nodules Neurologic:  . CN 2-12 intact . Sensation all 4 extremities intact Psychiatric:  . Mental status o Mood, affect appropriate o Orientation to person, place, time  . judgment and insight appear intact  Discharge Instructions  Discharge Instructions    Activity as tolerated - No restrictions   Complete by: As directed    Ambulatory referral to Neurology   Complete by: As directed    An  appointment is requested in approximately: 1 week   Call MD for:  persistant dizziness or light-headedness   Complete by: As directed    Call MD for:  persistant nausea and vomiting   Complete by: As directed    Call MD for:  severe uncontrolled pain   Complete by: As directed    Diet - low sodium heart healthy   Complete by: As directed    Discharge instructions   Complete by: As directed    Discharge to home with PT/OT No driving Follow up with PCP in 7-10 days Check glucoses twice a day. Once in the morning before breakfast and once in the evening before dinner. Record glucoses and take in to PCP visit. Follow up with neurology as directed.   Increase activity slowly   Complete by: As directed    Increase activity slowly   Complete by: As directed      Allergies as of 06/27/2019   No Known Allergies     Medication List    STOP taking these medications   acetaminophen 650 MG CR tablet Commonly known as: TYLENOL   albuterol 108 (90 Base) MCG/ACT inhaler Commonly known as: VENTOLIN HFA   ipratropium 17 MCG/ACT inhaler Commonly known as: ATROVENT HFA     TAKE these medications   aspirin EC 81 MG tablet Take 81 mg by mouth daily. What changed: Another medication with the same name was removed. Continue taking this medication, and follow the directions you see here.   famotidine 20 MG tablet Commonly known as: PEPCID Take 20 mg by mouth daily.   feeding supplement (GLUCERNA SHAKE) Liqd Take 237 mLs by mouth 3 (three) times daily between meals.   finasteride 5 MG tablet Commonly known as: PROSCAR Take 5 mg by mouth at bedtime.   glipiZIDE 10 MG 24 hr tablet Commonly known as: GLUCOTROL XL Take 10 mg by mouth daily.   insulin glargine 100 UNIT/ML injection Commonly known as: LANTUS Inject 0.2 mLs (20 Units total) into the skin 2 (two) times daily for 4 days, THEN 0.18 mLs (18 Units total) 2 (two) times daily for 3 days, THEN 0.15 mLs (15 Units total) 2 (two)  times daily for 3 days, THEN 0.12 mLs (12 Units total) 2 (two) times daily for 3 days, THEN 0.08 mLs (8 Units total) 2 (two) times daily for 3 days, THEN 0.05 mLs (5 Units total) 2 (two) times daily for 3 days. Start taking on: June 27, 2019   insulin regular 100 units/mL injection Commonly known as: NOVOLIN R Inject 2-10 Units into the skin See admin instructions. Inject 2-10 units per sliding scale three times daily before meals: 150-200=2u, 201-250=4u, 251-300=6u, 301-350=8u, 351-400=10   lisinopril-hydrochlorothiazide 20-25 MG tablet Commonly known as: ZESTORETIC Take 1 tablet by mouth daily.   metFORMIN 1000 MG tablet Commonly known as: GLUCOPHAGE Take 1,000 mg by mouth 2 (two) times daily.   multivitamin with minerals Tabs tablet Take 1 tablet by mouth daily.   polyethylene glycol 17 g packet  Commonly known as: MIRALAX / GLYCOLAX Take 17 g by mouth daily.   predniSONE 10 MG tablet Commonly known as: DELTASONE Take 6 tablets (60 mg total) by mouth daily for 3 days, THEN 5 tablets (50 mg total) daily for 3 days, THEN 4 tablets (40 mg total) daily for 3 days, THEN 3 tablets (30 mg total) daily for 3 days, THEN 2 tablets (20 mg total) daily for 3 days, THEN 1 tablet (10 mg total) daily for 3 days. Start taking on: June 28, 2019   promethazine 25 MG tablet Commonly known as: PHENERGAN Take 25 mg by mouth 2 (two) times daily as needed for nausea.   SAMBUCUS ELDERBERRY PO Take 1 capsule by mouth daily.   tamsulosin 0.4 MG Caps capsule Commonly known as: FLOMAX Take 0.4 mg by mouth daily after supper.   Vitamin B-12 2500 MCG Subl Place 2,500 mcg under the tongue daily.      No Known Allergies  The results of significant diagnostics from this hospitalization (including imaging, microbiology, ancillary and laboratory) are listed below for reference.    Significant Diagnostic Studies: CT Head Wo Contrast  Result Date: 06/22/2019 CLINICAL DATA:  Involuntary  spasm/contraction of right arm and leg. Began 2 days ago. EXAM: CT HEAD WITHOUT CONTRAST TECHNIQUE: Contiguous axial images were obtained from the base of the skull through the vertex without intravenous contrast. COMPARISON:  Brain MRI, 08/09/2013 FINDINGS: Brain: No evidence of acute infarction, hemorrhage, hydrocephalus, extra-axial collection or mass lesion/mass effect. Left thalamic lacunar infarct, old. Mild bilateral white matter hypoattenuation consistent with chronic microvascular ischemic change. Vascular: No hyperdense vessel or unexpected calcification. Skull: Normal. Negative for fracture or focal lesion. Sinuses/Orbits: Globes and orbits are unremarkable. Left maxillary sinus mucous retention cyst. Sinuses otherwise clear. Other: None. IMPRESSION: 1. No acute intracranial abnormalities. 2. Old left thalamic lacunar infarct. Mild chronic microvascular ischemic change. Electronically Signed   By: Lajean Manes M.D.   On: 06/22/2019 08:52   CT CHEST W CONTRAST  Result Date: 06/22/2019 CLINICAL DATA:  Ataxia. EXAM: CT CHEST, ABDOMEN, AND PELVIS WITH CONTRAST TECHNIQUE: Multidetector CT imaging of the chest, abdomen and pelvis was performed following the standard protocol during bolus administration of intravenous contrast. CONTRAST:  142mL OMNIPAQUE IOHEXOL 300 MG/ML  SOLN COMPARISON:  None. FINDINGS: CT CHEST FINDINGS Cardiovascular: No significant vascular findings. Normal heart size. No pericardial effusion. Mild to moderate severity coronary artery calcification is seen. Mediastinum/Nodes: No enlarged mediastinal, hilar, or axillary lymph nodes. Thyroid gland, trachea, and esophagus demonstrate no significant findings. Lungs/Pleura: Mild atelectasis is seen along the posterior aspect of the left upper lobe and posterior aspects of the bilateral lower lobes. There is no evidence of a pleural effusion or pneumothorax. Musculoskeletal: No chest wall mass or suspicious bone lesions identified. CT  ABDOMEN PELVIS FINDINGS Hepatobiliary: No focal liver abnormality is seen. Status post cholecystectomy. No biliary dilatation. Pancreas: Unremarkable. No pancreatic ductal dilatation or surrounding inflammatory changes. Spleen: Normal in size without focal abnormality. Adrenals/Urinary Tract: Adrenal glands are unremarkable. Kidneys are normal, without renal calculi, focal lesion, or hydronephrosis. Bladder is unremarkable. Stomach/Bowel: Stomach is within normal limits. The appendix is not identified. No evidence of bowel wall thickening, distention, or inflammatory changes. Noninflamed diverticula are seen within the descending and sigmoid colon. Vascular/Lymphatic: Mild aortic atherosclerosis. No enlarged abdominal or pelvic lymph nodes. Reproductive: There is moderate to marked severity prostate gland enlargement. Other: No abdominal wall hernia or abnormality. No abdominopelvic ascites. Musculoskeletal: No acute or significant osseous findings. IMPRESSION:  1. No evidence of acute abnormalities within the chest, abdomen or pelvis. 2. Mild to moderate severity coronary artery calcification. 3. Noninflamed diverticula of the descending and sigmoid colon. 4. Moderate to marked severity prostate gland enlargement. Please correlate to PSA values. Aortic Atherosclerosis (ICD10-I70.0). Electronically Signed   By: Virgina Norfolk M.D.   On: 06/22/2019 22:20   MR BRAIN W WO CONTRAST  Result Date: 06/22/2019 CLINICAL DATA:  Focal neuro deficit with stroke suspected. EXAM: MRI HEAD WITHOUT AND WITH CONTRAST TECHNIQUE: Multiplanar, multiecho pulse sequences of the brain and surrounding structures were obtained without and with intravenous contrast. CONTRAST:  73mL GADAVIST GADOBUTROL 1 MMOL/ML IV SOLN COMPARISON:  Head CT earlier today FINDINGS: Brain: No acute infarction, hemorrhage, hydrocephalus, extra-axial collection or mass lesion. Cystic intensity in the left thalamus which is very rounded and long-standing.  Given the shape and lack of adjacent gliosis suspect this is a dilated perivascular space or neuroglial cyst. Slight enlargement from before is non worrisome. There has been progression of chronic small vessel ischemia in the cerebral white matter and a generalized decrease in cerebral volume. No abnormal intracranial enhancement. Vascular: Normal flow voids and vascular enhancements Skull and upper cervical spine: Normal marrow signal Sinuses/Orbits: Retention cysts in the left maxillary sinus. IMPRESSION: Aging of the brain since 2015 with no acute or reversible finding. Electronically Signed   By: Monte Fantasia M.D.   On: 06/22/2019 12:52   CT ABDOMEN PELVIS W CONTRAST  Result Date: 06/22/2019 CLINICAL DATA:  Ataxia EXAM: CT CHEST, ABDOMEN, AND PELVIS WITH CONTRAST TECHNIQUE: Multidetector CT imaging of the chest, abdomen and pelvis was performed following the standard protocol during bolus administration of intravenous contrast. CONTRAST:  136mL OMNIPAQUE IOHEXOL 300 MG/ML  SOLN COMPARISON:  March 23, 2015 FINDINGS: CT CHEST FINDINGS Cardiovascular: No significant vascular findings. Normal heart size. No pericardial effusion. Mild to moderate severity coronary artery calcification is seen. Mediastinum/Nodes: No enlarged mediastinal, hilar, or axillary lymph nodes. Thyroid gland, trachea, and esophagus demonstrate no significant findings. Lungs/Pleura: Mild atelectasis is seen along the posterior aspect of the left upper lobe and posterior aspects of the bilateral lower lobes. There is no evidence of a pleural effusion or pneumothorax. Musculoskeletal: No chest wall mass or suspicious bone lesions identified. CT ABDOMEN PELVIS FINDINGS Hepatobiliary: No focal liver abnormality is seen. Status post cholecystectomy. No biliary dilatation. Pancreas: Unremarkable. No pancreatic ductal dilatation or surrounding inflammatory changes. Spleen: Normal in size without focal abnormality. Adrenals/Urinary Tract:  Adrenal glands are unremarkable. Kidneys are normal, without renal calculi, focal lesion, or hydronephrosis. Bladder is unremarkable. Stomach/Bowel: Stomach is within normal limits. The appendix is not identified. No evidence of bowel wall thickening, distention, or inflammatory changes. Noninflamed diverticula are seen within the descending and sigmoid colon. Vascular/Lymphatic: Mild aortic atherosclerosis. No enlarged abdominal or pelvic lymph nodes. Reproductive: There is moderate to marked severity prostate gland enlargement. Other: No abdominal wall hernia or abnormality. No abdominopelvic ascites. Musculoskeletal: No acute or significant osseous findings. IMPRESSION: 1. No evidence of acute abnormalities within the chest, abdomen or pelvis. 2. Mild to moderate severity coronary artery calcification. 3. Noninflamed diverticula of the descending and sigmoid colon. 4. Moderate to marked severity prostate gland enlargement. Please correlate to PSA values. Aortic Atherosclerosis (ICD10-I70.0). Electronically Signed   By: Virgina Norfolk M.D.   On: 06/22/2019 22:18   DG FLUORO GUIDE LUMBAR PUNCTURE  Result Date: 06/22/2019 CLINICAL DATA:  79 year old male with choreiform movements. Evaluate CSF. EXAM: DIAGNOSTIC LUMBAR PUNCTURE UNDER FLUOROSCOPIC GUIDANCE FLUOROSCOPY TIME:  Fluoroscopy Time:  0.5 minutes Number of Acquired Spot Images: 1 PROCEDURE: Informed consent was obtained from the patient prior to the procedure, including potential complications of headache, allergy, and pain. With the patient prone, the lower back was prepped with Betadine. 1% Lidocaine was used for local anesthesia. Lumbar puncture was performed at the L3-4 level using a 20 gauge needle with return of clear colorless CSF with an opening pressure of 16 cm water. Ten ml of CSF were obtained for laboratory studies. The patient tolerated the procedure well and there were no apparent complications. IMPRESSION: Fluoroscopic guided lumbar  puncture.  No immediate complication. Electronically Signed   By: Margarette Canada M.D.   On: 06/22/2019 17:33    Microbiology: Recent Results (from the past 240 hour(s))  CSF culture     Status: None   Collection Time: 06/22/19  2:12 PM   Specimen: Back; Cerebrospinal Fluid  Result Value Ref Range Status   Specimen Description BACK  Final   Special Requests NONE  Final   Gram Stain NO WBC SEEN NO ORGANISMS SEEN CYTOSPIN SMEAR   Final   Culture   Final    NO GROWTH 3 DAYS Performed at [facility], [address] [place], [place] [address]    Report Status 06/26/2019 FINAL  Final     Labs: Basic Metabolic Panel: Recent Labs  Lab 06/24/19 1207 06/25/19 0330 06/26/19 0354  NA 132* 137 135  K 3.3* 3.7 3.8  CL 98 102 98  CO2 23 25 25   GLUCOSE 239* 308* 244*  BUN 17 21 26*  CREATININE 1.16 1.07 0.99  CALCIUM 8.6* 8.4* 8.5*   Liver Function Tests: No results for input(s): AST, ALT, ALKPHOS, BILITOT, PROT, ALBUMIN in the last 168 hours. No results for input(s): LIPASE, AMYLASE in the last 168 hours. No results for input(s): AMMONIA in the last 168 hours. CBC: No results for input(s): WBC, NEUTROABS, HGB, HCT, MCV, PLT in the last 168 hours. Cardiac Enzymes: No results for input(s): CKTOTAL, CKMB, CKMBINDEX, TROPONINI in the last 168 hours. BNP: BNP (last 3 results) Recent Labs    05/01/19 0507 05/13/19 1214  BNP 25.9 20.9    ProBNP (last 3 results) No results for input(s): PROBNP in the last 8760 hours.  CBG: Recent Labs  Lab 06/26/19 0608 06/26/19 1211 06/26/19 1658 06/26/19 2217 06/27/19 0644  GLUCAP 218* 193* 349* 326* 211*    Principal Problem:   Chorea Active Problems:   COVID-19 virus infection   HTN (hypertension)   Hyponatremia   Anemia   Type 2 diabetes mellitus with hyperlipidemia ([place])   Time coordinating discharge: 38 minutes  Signed:        Azriella Mattia, DO [facility]  06/30/2019, 5:11 PM

## 2019-07-03 LAB — MISC LABCORP TEST (SEND OUT): Labcorp test code: 9985

## 2019-07-04 NOTE — Progress Notes (Signed)
Assessment/Plan:   1.  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  It is clear that the right side is much worse than the left, but I think that he has a little bit of chorea on the left side.   His MRI of the brain definitely showed a cystic structure in the left thalamus region, but that had been present really since 2015, although it had grown a little bit.  I initially wondered if it was possible he had a hypoglycemic or hyperglycemic event, as I have certainly seen these result in structural etiologies producing chorea.  That being said, the MRI of the brain did not show anything new.  However, he did get better, which generally happens with hypoglycemic induced chorea, and any structural induced lesion.  That being said, he still has quite a bit of chorea on the right, and I think some on the left as well.  Because of that, we will wait for the [facility] autoimmune panel to come back.  I suspect that is going to be normal.  If so, the patient wants to take a wait and see approach, since he thinks that he is about 80% better.  If it is from a structural lesion, he likely will continue to get better.  If he gets worse, he is to let me know.  If so, then I think he will need a paraneoplastic panel as well as possible work-up for Huntington's, although that would be very unusual to present acutely.  -We talked about symptomatic treatments.  Things like amantadine and low-dose clonazepam can be beneficial, but can increase risk for confusion and falls.  He really does not want that and I do not disagree.  -Patient is currently in physical therapy.  He will continue that.  He will use his walker at all times.  -Daughter asked about driving.  He should not be driving given the amount of chorea in the right leg.  -Daughter was upset about visitor regulations.  Explained to them these are guidelines set out by [facility] for patient and visitor safety as well as staff safety.  Did offer them virtual  visit as well, but they opted to be seen in person today.  Patient was also worked in to the clinic today for quick access given the acute nature of the visit.  Daughter was present on the telephone the entire visit, interacted with [place] during the visit, and was given the opportunity to ask questions and I answered those to the best of my ability.  2.  Uncontrolled DM  -Daughter asks me about whether or not he should see endocrinology.  Discussed with patient's daughter and with patient that many primary care physicians are very good at treating diabetes, but patients are not always compliant with regimen.  Patient admits that that was the case with him.  They will talk to primary care about this.  Patient states that he is being more compliant now.  3.  We will have the patient follow-up within the next 5 months, sooner should new neurologic issues arise.  We will call them when the autoimmune panel from [facility] comes back.  I will check on that within the next 3 weeks, which was the anticipated timeframe to come back. Subjective:   Luke Edwards was seen today in neurologic consultation at the request of Greta Doom,*.  The consultation is for the evaluation of chorea.  Outside records that were made available to me were  reviewed and I spoke with Dr. Addison Lank on the phone about the patient.  Patient was admitted to the hospital on March 13.  2 days prior to admission, the patient noted involuntary movement in the right arm and leg.  This was about 2 weeks after he had Covid per pt/records but daughter states that he had covid in [place].  MRI of the brain was performed, which I personally reviewed.  There is a cystic structure in the left thalamus, which was also present in 2015.  It is enlarged compared to 2015, although just slightly.  He was started on Solu-Medrol in the hospital.  Autoimmune panel was sent to the [facility]. We called the lab and were told results would not be back for a  few weeks.  Lumbar puncture was performed.  I personally interpreted these.  CSF white blood cells were 1.  CSF red cells were 0.  CSF VDRL was nonreactive.  CSF protein was 35.  CSF culture was negative.  His sodium was fairly low on entering the hospital at 124.  Sedimentation rate was fairly unremarkable.  Pt reports that he is still on prednisone, currently 40 mg, starting 30 mg tomorrow, then 20 mg the following day until off.  Today's movements are 80% movement than better than in the hospital.  If angry/nervous it is nervous.  Daughter states that DM under poor control - the day before he was in the hospital BS was 500.  No family hx of strange or abnormal movements.  No abnormal movements on the L side.  Took xanax, 0.5 mg, 1/2 tablet today.      PREVIOUS MEDICATIONS:   ALLERGIES:  No Known Allergies  CURRENT MEDICATIONS:  Outpatient Encounter Medications as of 07/05/2019  Medication Sig  . aspirin EC 81 MG tablet Take 81 mg by mouth daily.  Marland Kitchen Black Elderberry (SAMBUCUS ELDERBERRY PO) Take 1 capsule by mouth daily.  . Cyanocobalamin (VITAMIN B-12) 2500 MCG SUBL Place 2,500 mcg under the tongue daily.  . famotidine (PEPCID) 20 MG tablet Take 20 mg by mouth daily.  . feeding supplement, GLUCERNA SHAKE, (GLUCERNA SHAKE) LIQD Take 237 mLs by mouth 3 (three) times daily between meals.  . finasteride (PROSCAR) 5 MG tablet Take 5 mg by mouth at bedtime.   Marland Kitchen glipiZIDE (GLUCOTROL XL) 10 MG 24 hr tablet Take 10 mg by mouth daily.   . insulin glargine (LANTUS) 100 UNIT/ML injection Inject 0.2 mLs (20 Units total) into the skin 2 (two) times daily for 4 days, THEN 0.18 mLs (18 Units total) 2 (two) times daily for 3 days, THEN 0.15 mLs (15 Units total) 2 (two) times daily for 3 days, THEN 0.12 mLs (12 Units total) 2 (two) times daily for 3 days, THEN 0.08 mLs (8 Units total) 2 (two) times daily for 3 days, THEN 0.05 mLs (5 Units total) 2 (two) times daily for 3 days.  . insulin regular (NOVOLIN R) 100  units/mL injection Inject 2-10 Units into the skin See admin instructions. Inject 2-10 units per sliding scale three times daily before meals: 150-200=2u, 201-250=4u, 251-300=6u, 301-350=8u, 351-400=10  . lisinopril-hydrochlorothiazide (PRINZIDE,ZESTORETIC) 20-25 MG per tablet Take 1 tablet by mouth daily.  . metFORMIN (GLUCOPHAGE) 1000 MG tablet Take 1,000 mg by mouth 2 (two) times daily.   . Multiple Vitamin (MULTIVITAMIN WITH MINERALS) TABS tablet Take 1 tablet by mouth daily.  . polyethylene glycol (MIRALAX / GLYCOLAX) 17 g packet Take 17 g by mouth daily.  . predniSONE (DELTASONE) 10  MG tablet Take 6 tablets (60 mg total) by mouth daily for 3 days, THEN 5 tablets (50 mg total) daily for 3 days, THEN 4 tablets (40 mg total) daily for 3 days, THEN 3 tablets (30 mg total) daily for 3 days, THEN 2 tablets (20 mg total) daily for 3 days, THEN 1 tablet (10 mg total) daily for 3 days.  . promethazine (PHENERGAN) 25 MG tablet Take 25 mg by mouth 2 (two) times daily as needed for nausea.  . tamsulosin (FLOMAX) 0.4 MG CAPS capsule Take 0.4 mg by mouth daily after supper.    No facility-administered encounter medications on file as of 07/05/2019.    Objective:   PHYSICAL EXAMINATION:    VITALS:   Vitals:   07/05/19 1359  BP: 113/73  Pulse: (!) 105  SpO2: 98%  Weight: 191 lb (86.6 kg)  Height: 6\' 2"  (1.88 m)    GEN:  Normal appears male in no acute distress.  Appears stated age. HEENT:  Normocephalic, atraumatic. The mucous membranes are moist. The superficial temporal arteries are without ropiness or tenderness. Cardiovascular: tachy. regular Lungs: Clear to auscultation bilaterally. Neck/Heme: There are no carotid bruits noted bilaterally.  NEUROLOGICAL: Orientation:  The patient is alert and oriented x 3.   Cranial nerves: There is good facial symmetry.  Extraocular muscles are intact and visual fields are full to confrontational testing. Speech is fluent and clear. Soft palate rises  symmetrically and there is no tongue deviation. Hearing is intact to conversational tone. Tone: Tone is good throughout. Sensation: Sensation is intact to light touch and pinprick throughout (facial, trunk, extremities). Vibration is intact at the bilateral big toe. There is no extinction with double simultaneous stimulation. There is no sensory dermatomal level identified. Coordination:  The patient has no difficulty with RAM's or FNF bilaterally. Motor: Strength is 5/5 in the bilateral upper and lower extremities.  Shoulder shrug is equal and symmetric. There is no pronator drift.  There are no fasciculations noted. DTR's: Deep tendon reflexes are 2/4 at the bilateral biceps, triceps, brachioradialis, patella and achilles.  Plantar responses are downgoing bilaterally. Gait and Station: The patient is able to ambulate without difficulty. The patient is able to heel toe walk without any difficulty. The patient is able to ambulate in a tandem fashion. The patient is able to stand in the Romberg position.  Abnormal movements: Patient has moderate chorea in the right upper and right lower extremity.  I think he has a small amount of chorea in the left shoulder and also left fingertips.  I noticed in the left fingertips when I took away his walker and had him walk.  I have reviewed and interpreted the following labs independently   Chemistry      Component Value Date/Time   NA 135 06/26/2019 0354   K 3.8 06/26/2019 0354   CL 98 06/26/2019 0354   CO2 25 06/26/2019 0354   BUN 26 (H) 06/26/2019 0354   CREATININE 0.99 06/26/2019 0354      Component Value Date/Time   CALCIUM 8.5 (L) 06/26/2019 0354   ALKPHOS 55 06/22/2019 0929   AST 21 06/22/2019 0929   ALT 17 06/22/2019 0929   BILITOT 0.8 06/22/2019 0929      Lab Results  Component Value Date   HGBA1C 9.0 (H) 04/29/2019      Total time spent on today's visit was 75  minutes, including both face-to-face time and nonface-to-face time.   Time included that spent on review of records (  prior notes available to me/labs/imaging if pertinent), discussing treatment and goals, answering patient's questions and coordinating care.   Cc:  [name], Rosalyn Charters, MD

## 2019-07-05 ENCOUNTER — Ambulatory Visit: Payer: Medicare HMO | Admitting: Neurology

## 2019-07-05 ENCOUNTER — Encounter: Payer: Self-pay | Admitting: Neurology

## 2019-07-05 ENCOUNTER — Other Ambulatory Visit: Payer: Self-pay

## 2019-07-05 VITALS — BP 113/73 | HR 105 | Ht 74.0 in | Wt 191.0 lb

## 2019-07-05 DIAGNOSIS — E1169 Type 2 diabetes mellitus with other specified complication: Secondary | ICD-10-CM | POA: Diagnosis not present

## 2019-07-05 DIAGNOSIS — E785 Hyperlipidemia, unspecified: Secondary | ICD-10-CM | POA: Diagnosis not present

## 2019-07-05 DIAGNOSIS — G255 Other chorea: Secondary | ICD-10-CM

## 2019-07-05 NOTE — Patient Instructions (Addendum)
1.  You and I discussed taking medication but discussed that medication increases risks for falls and confusion and you decided that you didn't want it right now 2.  If you get worse before next visit, please let me know 3.  Please pay attention to your left side and see how that side does and if there are abnormal movements there

## 2019-07-08 ENCOUNTER — Telehealth: Payer: Self-pay

## 2019-07-08 NOTE — Telephone Encounter (Signed)
-----   Message from [place], DO sent at 07/08/2019  7:54 AM EDT ----- Let pt know that the autoantibody panel that the hospital drew from the [facility] clinic was negative, as I had told him I suspected it would be.  We will continue to wait and see how this progresses as he wanted to do

## 2019-07-08 NOTE — Telephone Encounter (Signed)
Informed patient of results and recommendations. 

## 2019-07-10 ENCOUNTER — Telehealth: Payer: Self-pay

## 2019-07-10 NOTE — Telephone Encounter (Signed)
Called and spoke to [place], patient's wife regarding the behavior of the patient's daughter during his last visit. Daughter was yelling and disruptive to staff and other patients while in the office. She was upset because due to current [facility] COVID policy visitors are not allowed back with patient unless the patient has a disability or cognition issue. Although daughter is POA she did not bring her paperwork with her to the visit and in addition patient is competent and does not need caregiver assistant for the office visit. Dr. Carles Collet called the daughter to included her in the office visit so that she could be apart of the visit. Daughter was disruptive to the point that Dr. Carles Collet had to ask if she wanted to r/s the visit and make it a video visit. I informed Freda Munro patient's wife that daughter could not come back in our office being disruptive. I explained that we want to provide the best care possible for her husband but could not do that if the daughter continues to disrupt the clinic and other patients within the clinic. Freda Munro verbalized understanding and apologized but noted that her daughter was concerned and wanted to make sure that the office was able to obtain a complete history regarding her father. I informed patients wife that if she had any further questions or concerns that she could reach out to me and I would try to assist her. She verbalized understanding and phone conversation was ended.

## 2019-09-25 ENCOUNTER — Telehealth: Payer: Self-pay | Admitting: Neurology

## 2019-09-25 NOTE — Telephone Encounter (Signed)
If the headache was from the LP, it would have started after the LP in mid march, not a few months later.  However, this doesn't mean I am not concerned.  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  If headaches are new onset and causing nausea, balance and vision change, he really should go to ER.  If that eval is neg, we can certainly refer him to the headache clinic for long term management

## 2019-09-25 NOTE — Telephone Encounter (Signed)
Spoke with pts daughter. Pt having increasingly worse headaches over last month. Describes as throbbing, left posterior, 6-8/10, taking tylenol ER at least once a day which is unusual for pt. Nauseated. Loses balance with turning around, some blurry vision. Blood sugars avg 140-220. She is worried that r/t last LP in March 2021.

## 2019-09-25 NOTE — Telephone Encounter (Signed)
Patient's daughter [name] called in and is wanting to speak with someone about the patient. He has been having daily bad headaches, stumbling around, stays very weak, and nausea with no energy. She would like some advice on what they can do for him.

## 2019-09-26 NOTE — Telephone Encounter (Signed)
Spoke with pts daughter and encouraged her to have pt evaluated at the ED if headaches are continuing. She states he woke up with a headache again today but Tylenol did ease it of somewhat. She will speak to the pt about getting evaluated at the ED.

## 2019-11-26 ENCOUNTER — Ambulatory Visit: Payer: Medicare HMO | Admitting: Neurology

## 2019-11-27 ENCOUNTER — Encounter: Payer: Self-pay | Admitting: Neurology

## 2019-11-27 ENCOUNTER — Other Ambulatory Visit: Payer: Self-pay

## 2019-11-27 ENCOUNTER — Ambulatory Visit: Payer: Medicare HMO | Admitting: Neurology

## 2019-11-27 VITALS — BP 156/91 | HR 79 | Ht 74.0 in | Wt 206.0 lb

## 2019-11-27 DIAGNOSIS — R9089 Other abnormal findings on diagnostic imaging of central nervous system: Secondary | ICD-10-CM | POA: Diagnosis not present

## 2019-11-27 DIAGNOSIS — E0842 Diabetes mellitus due to underlying condition with diabetic polyneuropathy: Secondary | ICD-10-CM

## 2019-11-27 DIAGNOSIS — R269 Unspecified abnormalities of gait and mobility: Secondary | ICD-10-CM | POA: Diagnosis not present

## 2019-11-27 DIAGNOSIS — R2689 Other abnormalities of gait and mobility: Secondary | ICD-10-CM

## 2019-11-27 NOTE — Progress Notes (Signed)
Subjective:    Patient ID: Luke Edwards is a 79 y.o. male.  HPI     Star Age, MD, PhD [facility] [address], [address].O. [place], [place] [address]  Dear Dr. Jannette Fogo,   I saw your patient, Luke Edwards, upon your kind request, in my Neurologic clinic today for initial consultation of his recurrent headaches. Please note that the patient did not want to discuss headaches as he felt that his headaches have improved and he wanted to talk about his gait and his balance difficulties.  The patient is accompanied by his daughter today.  As you know, Mr. 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 Carles Collet recently, who reports difficulty with his gait and balance.  He feels that his symptoms date back to when he was diagnosed with Covid in January 2021.  He denies any significant headaches at this time and significant one-sided weakness or numbness or tingling.  He feels that he veers to one side or another.  Thankfully, he has not fallen.  He has 2 types of walkers, he has been using a cane but would like to be able to walk without any assistance.  He had physical therapy at home twice.  I reviewed your office records.  He has seen Dr. Carles Collet in March 2021.  He had a brain MRI with and without contrast on 06/22/2019 and I reviewed the report:   IMPRESSION: Aging of the brain since 2015 with no acute or reversible finding.  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  He was hospitalized in March due to involuntary movements, concern for Chorea and he was found to have hyponatremia at the time.  He was treated symptomatically with IV Solu-Medrol in the hospital with a oral taper.  Work-up also included spinal fluid  testing which was benign.  He did have elevated blood sugar values.  Vitamin B12 was elevated at 1343.  UDS was positive for benzodiazepines.  He has an appointment pending with Dr. Carles Collet in October.   He reports that his involuntary movements affecting the right upper and right lower extremities have improved.  He reports that when he went to the hospital he had severe involuntary movements affecting the right upper and lower extremities.  He tries to hydrate well with water.  He drinks caffeine in the form of coffee, 2 cups in the mornings, he drinks alcohol once a week typically.  He reports that his blood sugar has been difficult to control but is getting better.  His Past Medical History Is Significant For: Past Medical History:  Diagnosis Date   COVID-19    Diabetes mellitus without complication (HCC)    GERD (gastroesophageal reflux disease)    HLD (hyperlipidemia)    Hypertension     His Past Surgical History Is Significant For: Past Surgical History:  Procedure Laterality Date   APPENDECTOMY     CHOLECYSTECTOMY      His Family History Is Significant For: Family History  Problem Relation Age of Onset   Valvular heart disease Mother    Diabetes Mother    Heart attack Father        3's   Diabetes Daughter     His Social History Is Significant For: Social History   Socioeconomic History  Marital status: Married    Spouse name: Freda Munro   Number of children: 2   Years of education: 11   Highest education level: Not on file  Occupational History   Not on file  Tobacco Use   Smoking status: Never Smoker   Smokeless tobacco: Never Used  Vaping Use   Vaping Use: Never used  Substance and Sexual Activity   Alcohol use: Yes    Alcohol/week: 2.0 standard drinks    Types: 2 Cans of beer per week    Comment: Once during the week    Drug use: No   Sexual activity: Not on file  Other Topics Concern   Not on file  Social History Narrative    Right handed   Lives in one story and lives with wife and daughter   Social Determinants of Health   Financial Resource Strain:    Difficulty of Paying Living Expenses:   Food Insecurity:    Worried About Charity fundraiser in the Last Year:    Arboriculturist in the Last Year:   Transportation Needs:    Film/video editor (Medical):    Lack of Transportation (Non-Medical):   Physical Activity:    Days of Exercise per Week:    Minutes of Exercise per Session:   Stress:    Feeling of Stress :   Social Connections:    Frequency of Communication with Friends and Family:    Frequency of Social Gatherings with Friends and Family:    Attends Religious Services:    Active Member of Clubs or Organizations:    Attends Archivist Meetings:    Marital Status:     His Allergies Are:  No Known Allergies:   His Current Medications Are:  Outpatient Encounter Medications as of 11/27/2019  Medication Sig   aspirin EC 81 MG tablet Take 81 mg by mouth daily.   Cyanocobalamin (VITAMIN B-12) 2500 MCG SUBL Place 2,500 mcg under the tongue daily.   famotidine (PEPCID) 20 MG tablet Take 20 mg by mouth daily.   feeding supplement, GLUCERNA SHAKE, (GLUCERNA SHAKE) LIQD Take 237 mLs by mouth 3 (three) times daily between meals.   finasteride (PROSCAR) 5 MG tablet Take 5 mg by mouth at bedtime.    glipiZIDE (GLUCOTROL XL) 10 MG 24 hr tablet Take 10 mg by mouth daily.    insulin regular (NOVOLIN R) 100 units/mL injection Inject 2-10 Units into the skin See admin instructions. Inject 2-10 units per sliding scale three times daily before meals: 150-200=2u, 201-250=4u, 251-300=6u, 301-350=8u, 351-400=10   lisinopril-hydrochlorothiazide (PRINZIDE,ZESTORETIC) 20-25 MG per tablet Take 1 tablet by mouth daily.   metFORMIN (GLUCOPHAGE) 1000 MG tablet Take 1,000 mg by mouth 2 (two) times daily.    Multiple Vitamin (MULTIVITAMIN WITH MINERALS) TABS tablet Take 1 tablet by  mouth daily.   polyethylene glycol (MIRALAX / GLYCOLAX) 17 g packet Take 17 g by mouth daily.   tamsulosin (FLOMAX) 0.4 MG CAPS capsule Take 0.4 mg by mouth daily after supper.    insulin glargine (LANTUS) 100 UNIT/ML injection Inject 0.2 mLs (20 Units total) into the skin 2 (two) times daily for 4 days, THEN 0.18 mLs (18 Units total) 2 (two) times daily for 3 days, THEN 0.15 mLs (15 Units total) 2 (two) times daily for 3 days, THEN 0.12 mLs (12 Units total) 2 (two) times daily for 3 days, THEN 0.08 mLs (8 Units total) 2 (two) times daily for 3 days, THEN 0.05 mLs (  5 Units total) 2 (two) times daily for 3 days.   [DISCONTINUED] Black Elderberry (SAMBUCUS ELDERBERRY PO) Take 1 capsule by mouth daily.   [DISCONTINUED] promethazine (PHENERGAN) 25 MG tablet Take 25 mg by mouth 2 (two) times daily as needed for nausea.   No facility-administered encounter medications on file as of 11/27/2019.  :   Review of Systems:  Out of a complete 14 point review of systems, all are reviewed and negative with the exception of these symptoms as listed below:  Review of Systems  Neurological:       Pt here today for movement disorder, states balance issues, using cane, right arm weakness, reports problems started after having covid -19    Objective:  Neurological Exam  Physical Exam Physical Examination:   Vitals:   11/27/19 1312  BP: (!) 156/91  Pulse: 79    General Examination: The patient is a very pleasant 79 y.o. male in no acute distress. He appears well-developed and well-nourished and well groomed.   HEENT: Normocephalic, atraumatic, pupils are equal, round and reactive to light and accommodation. Extraocular tracking is good without limitation to gaze excursion or nystagmus noted. Normal smooth pursuit is noted. Hearing is grossly intact. Face is symmetric with normal facial animation and normal facial sensation. Speech is clear with no dysarthria noted. There is no hypophonia. There is no  lip, neck/head, jaw or voice tremor. Neck is supple with full range of passive and active motion. There are no carotid bruits on auscultation. Oropharynx exam reveals: moderate mouth dryness, adequate dental hygiene.  Tongue protrudes centrally and palate elevates symmetrically.  He has no obvious facial asymmetry, no dyskinesias in the oropharyngeal area, no dysarthria.  No lip, neck or jaw tremor.  Neck is supple.    Chest: Clear to auscultation without wheezing, rhonchi or crackles noted.  Heart: S1+S2+0, regular and normal without murmurs, rubs or gallops noted.   Abdomen: Soft, non-tender and non-distended with normal bowel sounds appreciated on auscultation.  Extremities: There is no pitting edema in the distal lower extremities bilaterally. Pedal pulses are intact.  Skin: Warm and dry without trophic changes noted.  Musculoskeletal: exam reveals no obvious joint deformities, tenderness or joint swelling or erythema.   Neurologically:  Mental status: The patient is awake, alert and oriented in all 4 spheres. His immediate and remote memory, attention, language skills and fund of knowledge are appropriate. There is no evidence of aphasia, agnosia, apraxia or anomia. Speech is clear with normal prosody and enunciation. Thought process is linear. Mood is normal and affect is normal.  Cranial nerves II - XII are as described above under HEENT exam. In addition: shoulder shrug is normal with equal shoulder height noted. Motor exam: Normal bulk, strength and tone is noted. There is no drift, tremor or rebound.  He has no obvious abnormal involuntary movements, in particular, no choreiform movements or hemiballismus, no myoclonus, no athetoid movements.  Sometimes he appears to be restless and fidgety but it is distractible.  Reflexes are 1+ in the upper extremities and knees, absent in the ankles.  Babinski: Toes are flexor bilaterally. Fine motor skills and coordination: intact with normal finger  taps, normal hand movements, normal rapid alternating patting, normal foot taps and normal foot agility.  Cerebellar testing: No dysmetria or intention tremor on finger to nose testing. Heel to shin is unremarkable bilaterally. There is no truncal or gait ataxia.  Sensory exam: intact to light touch, pinprick, vibration, temperature sense in the upper extremities but  he does have decreased sensation to pinprick and temperature in the distal lower extremities up to above ankle areas bilaterally.   Gait, station and balance: He stands with mild difficulty and stands wide-based initially but can stand slightly narrow base, Romberg is without any corrective steps, slight sway noted.  He walks slightly wider based, overall mildly in securely and slowly, nonspecific changes noted, no shuffling, preserved arm swing is noted bilaterally.   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 Carles Collet recently, who presents for evaluation of his gait and balance difficulties.  He has had difficulty with his balance for the past several months, essentially since January of this year.  He had Covid infection earlier this year.  He does report some shortness of breath.  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  Nevertheless, to get reassurance as to the nature of the lesion in the brain which does not appear to be a tumor, I suggested input from a neurosurgeon.  He may benefit from a repeat brain MRI but we will leave this up to the  discretion of the surgical evaluator.  He is agreeable to this approach.  In addition, he may benefit from seeing a pulmonologist for his shortness of breath and history of Covid infection and in particular with regards to concern about the vaccine, he may get some more valuable input from a pulmonologist.  He is advised to talk to you about a referral to pulmonology.  I will go ahead with the referral to neurosurgery.  In addition, I would like for him to have outpatient physical therapy.  I suggested that he start using his walker either his 2 wheeled walker or 4 wheeled walker for now for gait safety.  On examination he has a nonspecific gait insecurity, no obvious movement disorder noted.  In particular, no hemiballismus, no chorea, no myoclonus, and no parkinsonism.  Given his one-sided involuntary movements and the thalamic cystic lesion noted on the brain MRI, I suggested we proceed with an EEG through our office.  He is agreeable to this, we will call him with the test results.  He does have evidence of neuropathy, likely diabetic.  He has had symptoms with regards to neuropathy as well.  He is advised that he should continue to work towards optimizing his diabetes control.  He is advised to talk to you about seeing a endocrinologist as the daughter was wondering if he needs to see a specialist.  He can certainly discuss this with you I believe.  For now, I did not suggest any new medications from my end of things, we can play it by ear.  He is advised to let us know if he has not heard anything about the PT referral or neurosurgical referral and we will certainly be in touch with EEG results from our end.  I answered all the questions today and the patient and his daughter were in agreement. Thank you very much for allowing me to participate in the care of  this nice patient. If I can be of any further assistance to you please do not hesitate to call me at [phone].  Sincerely,   Star Age, MD,  PhD

## 2019-11-27 NOTE — Patient Instructions (Signed)
I believe your balance issue and gait disorder are secondary to more than 1 factor, I think your neuropathy is a contributing factor.  Also, not always hydrating optimally may contribute to balance issues.  I do believe you would benefit from using your walker rather than your cane at this moment.  I would like to make a referral to physical therapy for them to work on your balance, your gait, and to see if you continue to benefit from the walker or if you can safely transition to the cane.   Since you have shortness of breath with minimal activity, please talk to Dr. Jannette Fogo about seeing a lung specialist called pulmonologist.  In addition, you may get clarification about whether or not you should take the Covid vaccine since you had a Covid infection in January.   Also, with regards to the cystic anomaly found on your brain scan, I would like to make a referral to a neurosurgeon to get better clarification as to whether it is something that needs to be monitored over time.   In addition to previously undergoing significant testing through the hospital, I would like to proceed with an EEG through our office.  This is an Passenger transport manager test which we will schedule separately and call you with the results once available.   Please remember to stand up slowly and get your bearings first turn slowly, no bending down to pick anything, no heavy lifting, be extra careful at night and first thing in the morning. Also, be careful in the Bathroom and the kitchen.   Remember to drink plenty of fluid, 6 to 8 cups/day are recommended generally speaking, 8 ounce each.  For your neuropathy, it is imperative that you pursue strict glucose control.  He can also talk to your primary care physician about the possibility of seeing a diabetes specialist, called endocrinologist.

## 2019-12-23 ENCOUNTER — Other Ambulatory Visit: Payer: Self-pay

## 2019-12-23 ENCOUNTER — Ambulatory Visit: Payer: Medicare HMO | Admitting: Diagnostic Neuroimaging

## 2019-12-23 DIAGNOSIS — R258 Other abnormal involuntary movements: Secondary | ICD-10-CM | POA: Diagnosis not present

## 2019-12-23 DIAGNOSIS — R269 Unspecified abnormalities of gait and mobility: Secondary | ICD-10-CM

## 2019-12-23 DIAGNOSIS — E0842 Diabetes mellitus due to underlying condition with diabetic polyneuropathy: Secondary | ICD-10-CM

## 2019-12-23 DIAGNOSIS — R9089 Other abnormal findings on diagnostic imaging of central nervous system: Secondary | ICD-10-CM

## 2019-12-23 DIAGNOSIS — R2689 Other abnormalities of gait and mobility: Secondary | ICD-10-CM

## 2019-12-30 NOTE — Progress Notes (Signed)
Please call patient and advise him that his EEG, the electrical brainwave test we performed in our office was reported as normal in the awake and drowsy states.

## 2019-12-30 NOTE — Procedures (Signed)
   [facility]  EEG (ELECTROENCEPHALOGRAM) REPORT   STUDY DATE: 12/23/19 PATIENT NAME: Luke Edwards DOB: May 30, 1940 MRN: [id number]  ORDERING CLINICIAN: Star Age, MD PhD   TECHNOLOGIST: Babs Bertin  TECHNIQUE: Electroencephalogram was recorded utilizing standard 10-20 system of lead placement and reformatted into average and bipolar montages.  RECORDING TIME: 26 minutes  ACTIVATION: hyperventilation and photic stimulation  CLINICAL INFORMATION: 79 year old male with abnormal movements.   FINDINGS: Posterior dominant background rhythms, which attenuate with eye opening, ranging 9-10 hertz and 15-20 microvolts. No focal, lateralizing, epileptiform activity or seizures are seen. Patient recorded in the awake and drowsy state. EKG channel shows regular rhythm of 75-80 beats per minute.   IMPRESSION:   Normal EEG in the awake and drowsy states.   INTERPRETING PHYSICIAN:  Penni Bombard, MD Certified in Neurology, Neurophysiology and Neuroimaging  [facility] [address], [place], [place] [address] [phone]

## 2019-12-31 ENCOUNTER — Telehealth: Payer: Self-pay

## 2019-12-31 NOTE — Telephone Encounter (Signed)
-----   Message from Star Age, MD sent at 12/30/2019  4:46 PM EDT ----- Please call patient and advise him that his EEG, the electrical brainwave test we performed in our office was reported as normal in the awake and drowsy states.

## 2019-12-31 NOTE — Telephone Encounter (Signed)
I called the pt and spoke with his wife and advised of results ( ok per dpr).  I also called pt's daughter [name] ( ok per dpr) per wife's request to update on result but was connected to a vm that was full.   I advised the pt's wife to have the pt call me back if he has any questions.  Daral's # is [phone].

## 2020-01-15 ENCOUNTER — Telehealth: Payer: Self-pay | Admitting: Diagnostic Neuroimaging

## 2020-01-15 NOTE — Telephone Encounter (Signed)
Records faxed to [facility] at [phone] on 01/15/2020.

## 2020-02-03 ENCOUNTER — Ambulatory Visit: Payer: Medicare HMO | Admitting: Neurology

## 2020-02-03 ENCOUNTER — Telehealth: Payer: Self-pay | Admitting: Diagnostic Neuroimaging

## 2020-02-03 NOTE — Telephone Encounter (Signed)
Per [facility] . Patient has called and CX his apt two times. Patient stated he would call back and reschedule he has not . [phone]

## 2020-02-18 DIAGNOSIS — K219 Gastro-esophageal reflux disease without esophagitis: Secondary | ICD-10-CM

## 2020-02-18 DIAGNOSIS — Z794 Long term (current) use of insulin: Secondary | ICD-10-CM

## 2020-02-18 DIAGNOSIS — E1142 Type 2 diabetes mellitus with diabetic polyneuropathy: Secondary | ICD-10-CM

## 2020-02-18 HISTORY — DX: Type 2 diabetes mellitus with diabetic polyneuropathy: Z79.4

## 2020-02-18 HISTORY — DX: Gastro-esophageal reflux disease without esophagitis: K21.9

## 2020-02-18 HISTORY — DX: Long term (current) use of insulin: E11.42

## 2020-12-08 DIAGNOSIS — M1712 Unilateral primary osteoarthritis, left knee: Secondary | ICD-10-CM

## 2020-12-08 HISTORY — DX: Unilateral primary osteoarthritis, left knee: M17.12

## 2021-05-09 IMAGING — DX DG CHEST 1V PORT
1 series · 1 of 1 positions shown · non-contrast
Comparison: Portable exam 0900 hours compared to 04/19/2017

CLINICAL DATA: Fever

EXAM:
PORTABLE CHEST 1 VIEW

[chest ap]
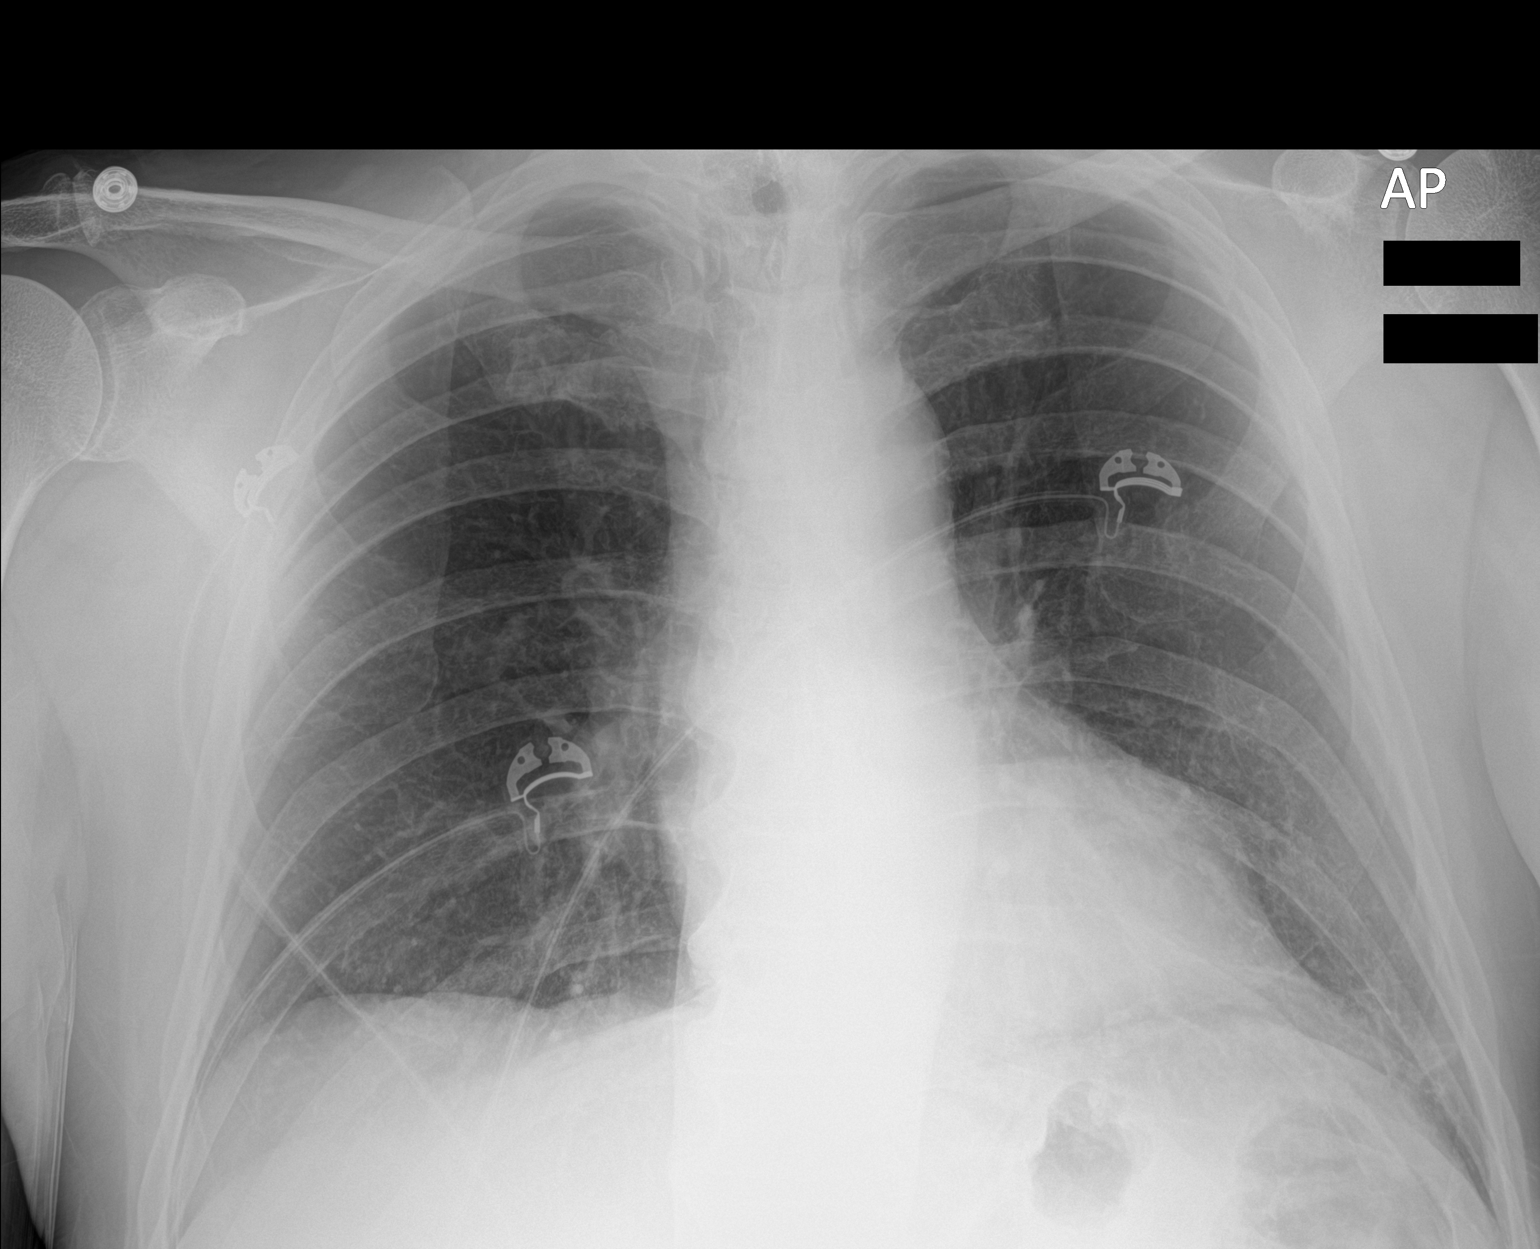

[1 of 1 positions shown; findings below may reference images not displayed]

FINDINGS: Normal heart size, mediastinal contours, and pulmonary vascularity.

Minimal LEFT basilar atelectasis.

Lungs otherwise clear.

No acute infiltrate, pleural effusion or pneumothorax.

Scattered endplate spur formation thoracic spine.
IMPRESSION: Minimal LEFT basilar atelectasis.

## 2021-07-02 IMAGING — CT CT HEAD W/O CM
4 series · 16 of 47 positions shown, 18 images · non-contrast
Comparison: Brain MRI, 08/09/2013

CLINICAL DATA: Involuntary spasm/contraction of right arm and leg.
Began 2 days ago.

EXAM:
CT HEAD WITHOUT CONTRAST
TECHNIQUE: Contiguous axial images were obtained from the base of the skull
through the vertex without intravenous contrast.

[Series 3: head bone · axial · 0.41mm/px · z∈[-156,-124]mm · 3 of 83 slices shown]
[im 9/83  bone]
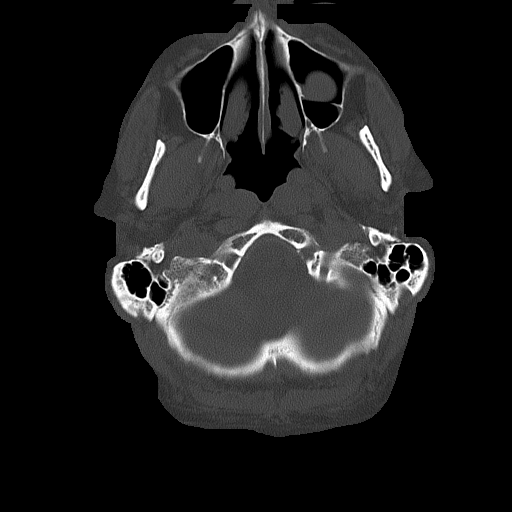
[im 17/83  bone]
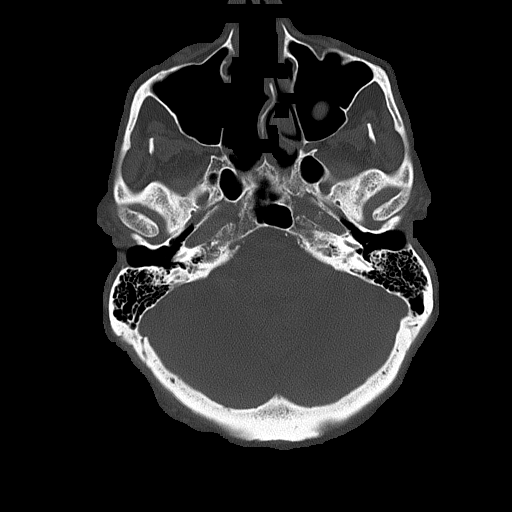
[im 25/83  bone]
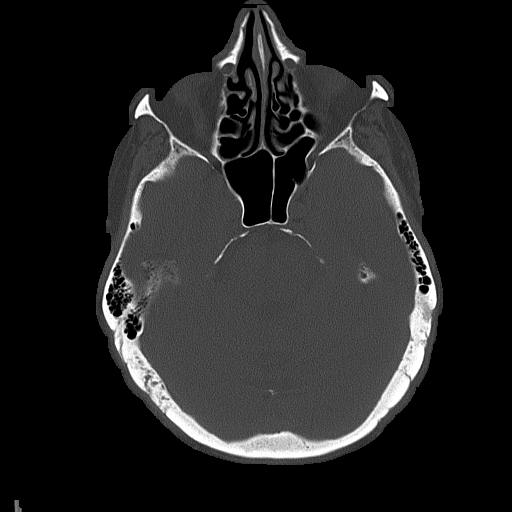

[Series 4: head without · axial · non-contrast · 0.41mm/px · z∈[-152,-32]mm · 7 of 33 slices shown, 9 images]
[im 5/33  brain]
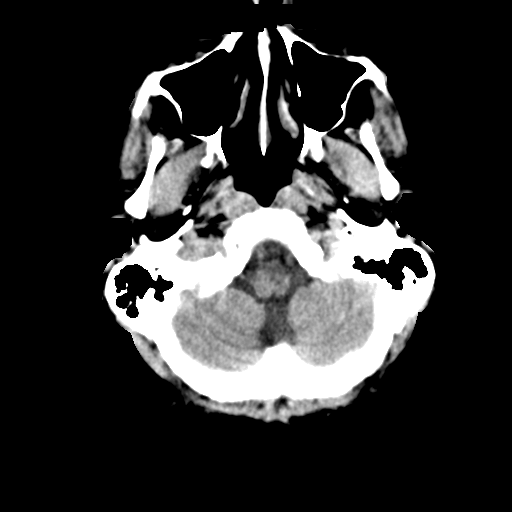
[im 5/33  bone]
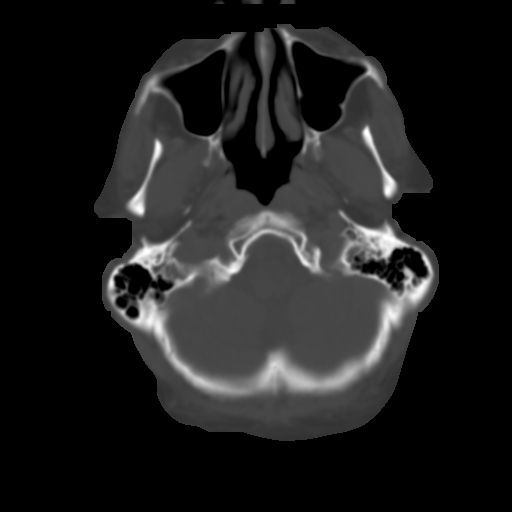
[im 9/33  brain]
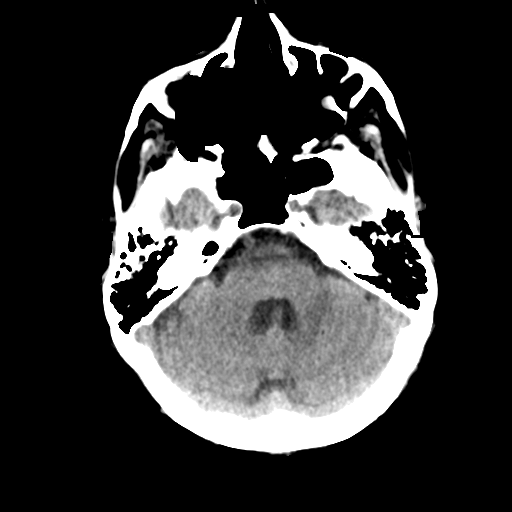
[im 13/33  brain]
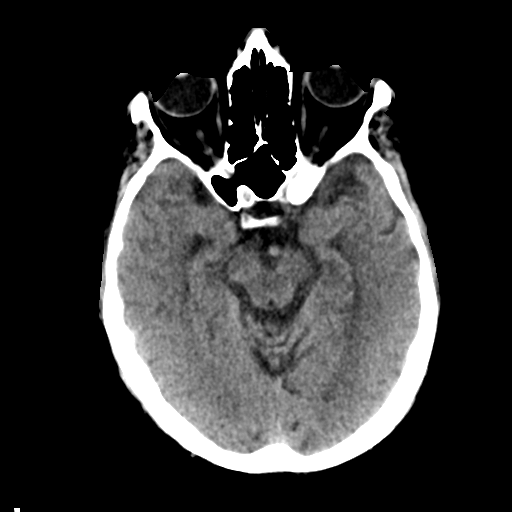
[im 17/33  brain]
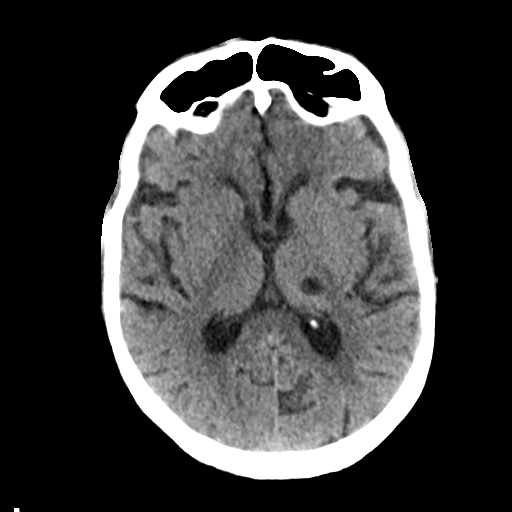
[im 21/33  brain]
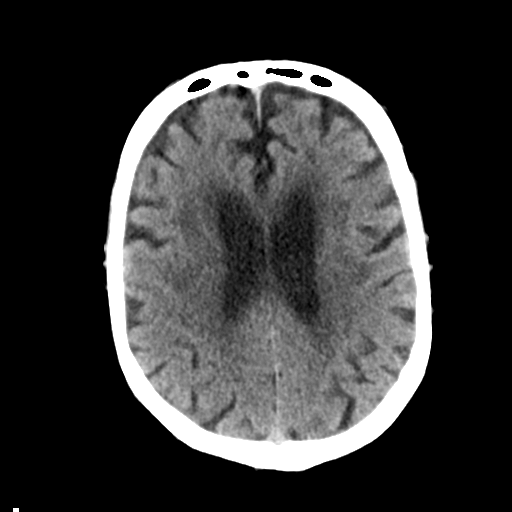
[im 21/33  bone]
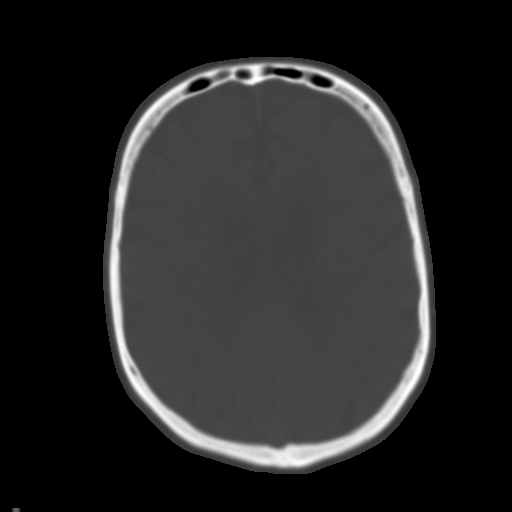
[im 25/33  brain]
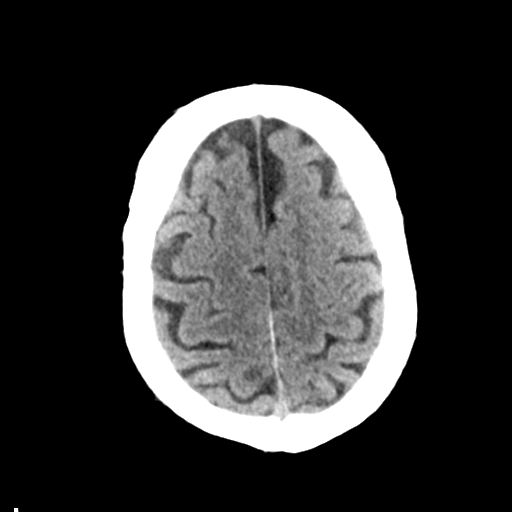
[im 29/33  brain]
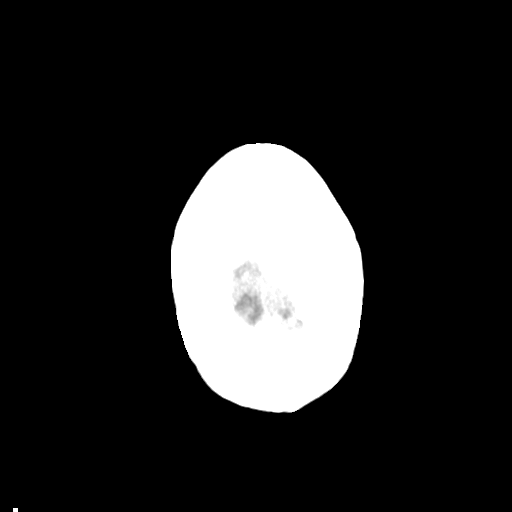

[Series 5: head without cor · coronal · non-contrast · 0.36mm/px · 3 of 62 slices shown]
[im 21/62  brain]
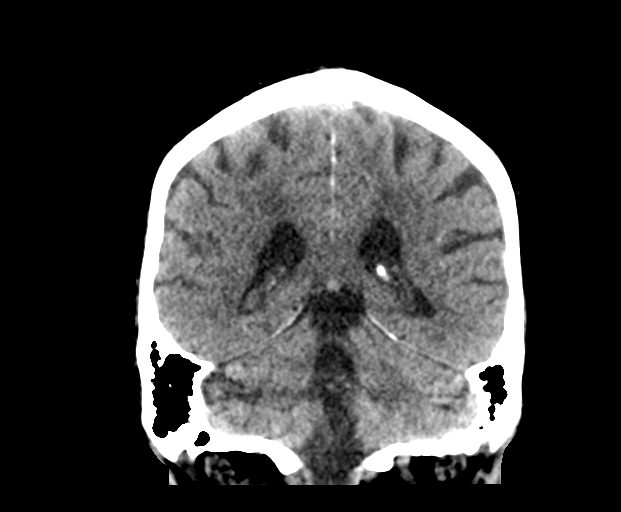
[im 28/62  brain]
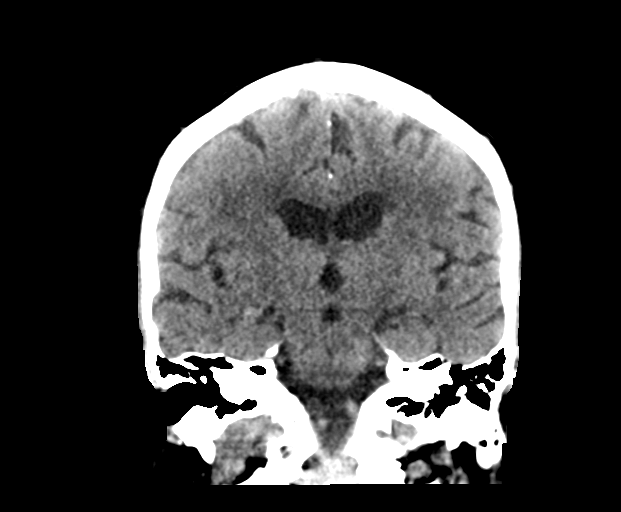
[im 34/62  brain]
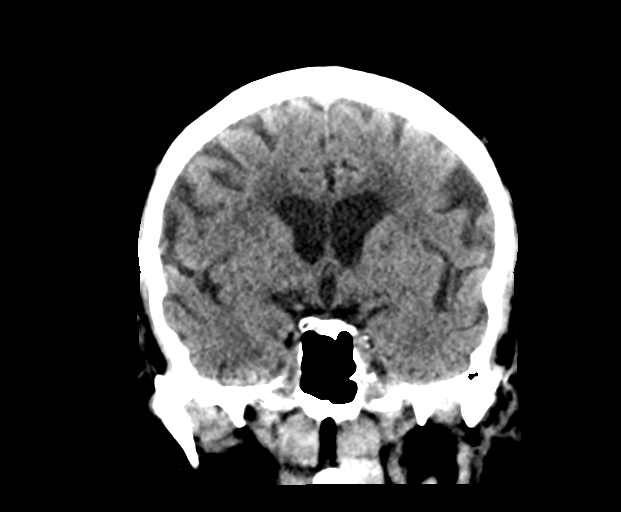

[Series 6: head without sag · sagittal · non-contrast · 0.34mm/px · 3 of 51 slices shown]
[im 17/51  brain]
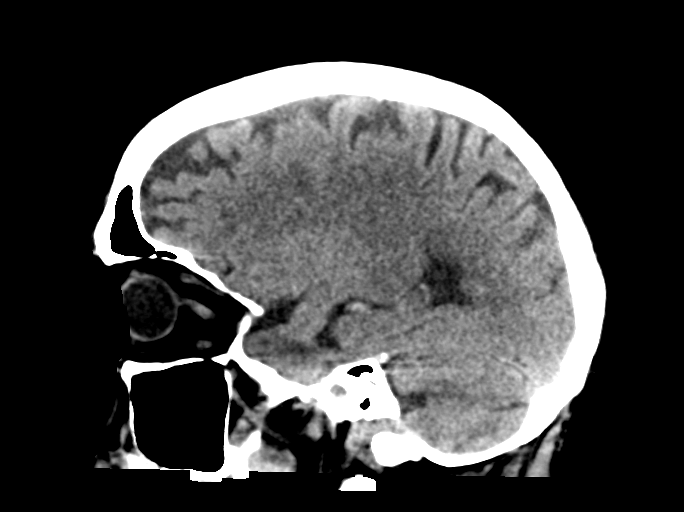
[im 26/51  brain]
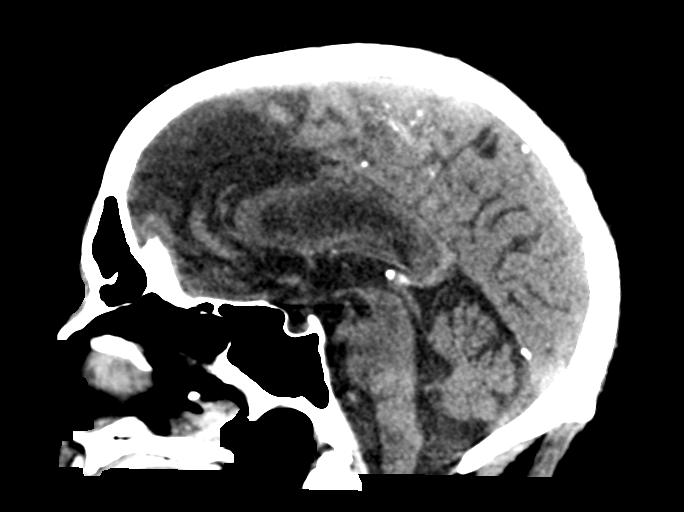
[im 34/51  brain]
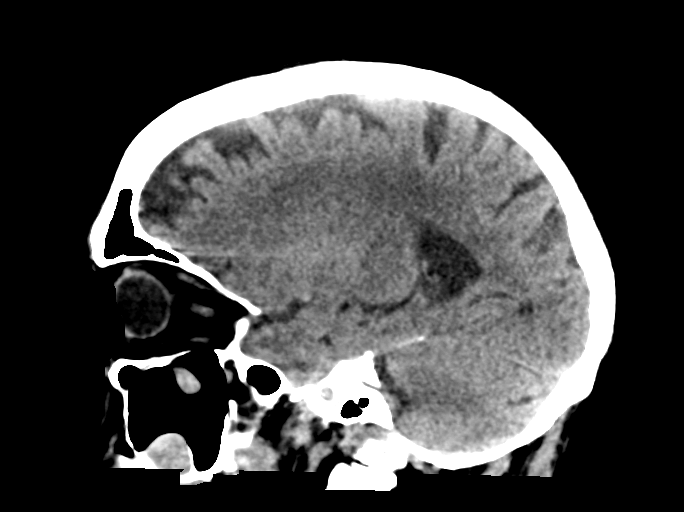

[16 of 47 positions shown; findings below may reference images not displayed]

FINDINGS: Brain: No evidence of acute infarction, hemorrhage, hydrocephalus,
extra-axial collection or mass lesion/mass effect.

Left thalamic lacunar infarct, old. Mild bilateral white matter
hypoattenuation consistent with chronic microvascular ischemic
change.

Vascular: No hyperdense vessel or unexpected calcification.

Skull: Normal. Negative for fracture or focal lesion.

Sinuses/Orbits: Globes and orbits are unremarkable. Left maxillary
sinus mucous retention cyst. Sinuses otherwise clear.

Other: None.
IMPRESSION: 1. No acute intracranial abnormalities.
2. Old left thalamic lacunar infarct. Mild chronic microvascular
ischemic change.

## 2022-03-11 DIAGNOSIS — E1165 Type 2 diabetes mellitus with hyperglycemia: Secondary | ICD-10-CM | POA: Diagnosis not present

## 2022-03-11 DIAGNOSIS — E876 Hypokalemia: Secondary | ICD-10-CM | POA: Diagnosis not present

## 2022-04-05 DIAGNOSIS — I10 Essential (primary) hypertension: Secondary | ICD-10-CM | POA: Diagnosis not present

## 2022-04-06 DIAGNOSIS — E559 Vitamin D deficiency, unspecified: Secondary | ICD-10-CM | POA: Diagnosis not present

## 2022-04-06 DIAGNOSIS — E1165 Type 2 diabetes mellitus with hyperglycemia: Secondary | ICD-10-CM | POA: Diagnosis not present

## 2022-04-06 DIAGNOSIS — M150 Primary generalized (osteo)arthritis: Secondary | ICD-10-CM | POA: Diagnosis not present

## 2022-04-06 DIAGNOSIS — D518 Other vitamin B12 deficiency anemias: Secondary | ICD-10-CM | POA: Diagnosis not present

## 2022-04-06 DIAGNOSIS — N401 Enlarged prostate with lower urinary tract symptoms: Secondary | ICD-10-CM | POA: Diagnosis not present

## 2022-04-06 DIAGNOSIS — N179 Acute kidney failure, unspecified: Secondary | ICD-10-CM | POA: Diagnosis not present

## 2022-04-06 DIAGNOSIS — I10 Essential (primary) hypertension: Secondary | ICD-10-CM | POA: Diagnosis not present

## 2022-04-06 DIAGNOSIS — E038 Other specified hypothyroidism: Secondary | ICD-10-CM | POA: Diagnosis not present

## 2022-04-06 DIAGNOSIS — E782 Mixed hyperlipidemia: Secondary | ICD-10-CM | POA: Diagnosis not present

## 2022-04-18 DIAGNOSIS — F419 Anxiety disorder, unspecified: Secondary | ICD-10-CM | POA: Diagnosis not present

## 2022-04-27 DIAGNOSIS — D485 Neoplasm of uncertain behavior of skin: Secondary | ICD-10-CM | POA: Diagnosis not present

## 2022-04-27 DIAGNOSIS — L570 Actinic keratosis: Secondary | ICD-10-CM | POA: Diagnosis not present

## 2022-04-27 DIAGNOSIS — L578 Other skin changes due to chronic exposure to nonionizing radiation: Secondary | ICD-10-CM | POA: Diagnosis not present

## 2022-04-27 DIAGNOSIS — D2239 Melanocytic nevi of other parts of face: Secondary | ICD-10-CM | POA: Diagnosis not present

## 2022-05-02 DIAGNOSIS — E782 Mixed hyperlipidemia: Secondary | ICD-10-CM | POA: Diagnosis not present

## 2022-05-02 DIAGNOSIS — E1165 Type 2 diabetes mellitus with hyperglycemia: Secondary | ICD-10-CM | POA: Diagnosis not present

## 2022-05-02 DIAGNOSIS — N401 Enlarged prostate with lower urinary tract symptoms: Secondary | ICD-10-CM | POA: Diagnosis not present

## 2022-05-02 DIAGNOSIS — E559 Vitamin D deficiency, unspecified: Secondary | ICD-10-CM | POA: Diagnosis not present

## 2022-05-02 DIAGNOSIS — M150 Primary generalized (osteo)arthritis: Secondary | ICD-10-CM | POA: Diagnosis not present

## 2022-05-02 DIAGNOSIS — I10 Essential (primary) hypertension: Secondary | ICD-10-CM | POA: Diagnosis not present

## 2022-05-02 DIAGNOSIS — N179 Acute kidney failure, unspecified: Secondary | ICD-10-CM | POA: Diagnosis not present

## 2022-05-02 DIAGNOSIS — E038 Other specified hypothyroidism: Secondary | ICD-10-CM | POA: Diagnosis not present

## 2022-05-02 DIAGNOSIS — D518 Other vitamin B12 deficiency anemias: Secondary | ICD-10-CM | POA: Diagnosis not present

## 2022-05-03 DIAGNOSIS — C4442 Squamous cell carcinoma of skin of scalp and neck: Secondary | ICD-10-CM | POA: Diagnosis not present

## 2022-05-03 DIAGNOSIS — C44622 Squamous cell carcinoma of skin of right upper limb, including shoulder: Secondary | ICD-10-CM | POA: Diagnosis not present

## 2022-05-06 DIAGNOSIS — I10 Essential (primary) hypertension: Secondary | ICD-10-CM | POA: Diagnosis not present

## 2022-05-09 DIAGNOSIS — Z794 Long term (current) use of insulin: Secondary | ICD-10-CM | POA: Diagnosis not present

## 2022-05-09 DIAGNOSIS — E1140 Type 2 diabetes mellitus with diabetic neuropathy, unspecified: Secondary | ICD-10-CM | POA: Diagnosis not present

## 2022-05-09 DIAGNOSIS — I10 Essential (primary) hypertension: Secondary | ICD-10-CM | POA: Diagnosis not present

## 2022-05-09 DIAGNOSIS — Z7984 Long term (current) use of oral hypoglycemic drugs: Secondary | ICD-10-CM | POA: Diagnosis not present

## 2022-05-09 DIAGNOSIS — Z8673 Personal history of transient ischemic attack (TIA), and cerebral infarction without residual deficits: Secondary | ICD-10-CM | POA: Diagnosis not present

## 2022-05-09 DIAGNOSIS — E785 Hyperlipidemia, unspecified: Secondary | ICD-10-CM | POA: Diagnosis not present

## 2022-05-10 DIAGNOSIS — L0211 Cutaneous abscess of neck: Secondary | ICD-10-CM | POA: Diagnosis not present

## 2022-05-10 DIAGNOSIS — C4441 Basal cell carcinoma of skin of scalp and neck: Secondary | ICD-10-CM | POA: Diagnosis not present

## 2022-05-30 DIAGNOSIS — E1142 Type 2 diabetes mellitus with diabetic polyneuropathy: Secondary | ICD-10-CM | POA: Diagnosis not present

## 2022-05-30 DIAGNOSIS — N401 Enlarged prostate with lower urinary tract symptoms: Secondary | ICD-10-CM | POA: Diagnosis not present

## 2022-05-30 DIAGNOSIS — E1165 Type 2 diabetes mellitus with hyperglycemia: Secondary | ICD-10-CM | POA: Diagnosis not present

## 2022-05-30 DIAGNOSIS — I10 Essential (primary) hypertension: Secondary | ICD-10-CM | POA: Diagnosis not present

## 2022-05-30 DIAGNOSIS — Z79899 Other long term (current) drug therapy: Secondary | ICD-10-CM | POA: Diagnosis not present

## 2022-05-30 DIAGNOSIS — E559 Vitamin D deficiency, unspecified: Secondary | ICD-10-CM | POA: Diagnosis not present

## 2022-05-30 DIAGNOSIS — Z125 Encounter for screening for malignant neoplasm of prostate: Secondary | ICD-10-CM | POA: Diagnosis not present

## 2022-05-30 DIAGNOSIS — D518 Other vitamin B12 deficiency anemias: Secondary | ICD-10-CM | POA: Diagnosis not present

## 2022-05-30 DIAGNOSIS — M150 Primary generalized (osteo)arthritis: Secondary | ICD-10-CM | POA: Diagnosis not present

## 2022-05-30 DIAGNOSIS — E782 Mixed hyperlipidemia: Secondary | ICD-10-CM | POA: Diagnosis not present

## 2022-05-30 DIAGNOSIS — E038 Other specified hypothyroidism: Secondary | ICD-10-CM | POA: Diagnosis not present

## 2022-05-31 DIAGNOSIS — R0989 Other specified symptoms and signs involving the circulatory and respiratory systems: Secondary | ICD-10-CM | POA: Diagnosis not present

## 2022-05-31 DIAGNOSIS — I6523 Occlusion and stenosis of bilateral carotid arteries: Secondary | ICD-10-CM | POA: Diagnosis not present

## 2022-06-03 DIAGNOSIS — I70223 Atherosclerosis of native arteries of extremities with rest pain, bilateral legs: Secondary | ICD-10-CM | POA: Diagnosis not present

## 2022-06-06 DIAGNOSIS — I10 Essential (primary) hypertension: Secondary | ICD-10-CM | POA: Diagnosis not present

## 2022-06-07 DIAGNOSIS — E559 Vitamin D deficiency, unspecified: Secondary | ICD-10-CM | POA: Diagnosis not present

## 2022-06-07 DIAGNOSIS — E042 Nontoxic multinodular goiter: Secondary | ICD-10-CM | POA: Diagnosis not present

## 2022-06-07 DIAGNOSIS — D518 Other vitamin B12 deficiency anemias: Secondary | ICD-10-CM | POA: Diagnosis not present

## 2022-06-07 DIAGNOSIS — E038 Other specified hypothyroidism: Secondary | ICD-10-CM | POA: Diagnosis not present

## 2022-06-07 DIAGNOSIS — E782 Mixed hyperlipidemia: Secondary | ICD-10-CM | POA: Diagnosis not present

## 2022-06-07 DIAGNOSIS — R221 Localized swelling, mass and lump, neck: Secondary | ICD-10-CM | POA: Diagnosis not present

## 2022-06-07 DIAGNOSIS — I10 Essential (primary) hypertension: Secondary | ICD-10-CM | POA: Diagnosis not present

## 2022-06-07 DIAGNOSIS — E1165 Type 2 diabetes mellitus with hyperglycemia: Secondary | ICD-10-CM | POA: Diagnosis not present

## 2022-06-07 DIAGNOSIS — M150 Primary generalized (osteo)arthritis: Secondary | ICD-10-CM | POA: Diagnosis not present

## 2022-06-07 DIAGNOSIS — N401 Enlarged prostate with lower urinary tract symptoms: Secondary | ICD-10-CM | POA: Diagnosis not present

## 2022-06-07 DIAGNOSIS — N179 Acute kidney failure, unspecified: Secondary | ICD-10-CM | POA: Diagnosis not present

## 2022-06-10 DIAGNOSIS — I77811 Abdominal aortic ectasia: Secondary | ICD-10-CM | POA: Diagnosis not present

## 2022-06-14 DIAGNOSIS — R2242 Localized swelling, mass and lump, left lower limb: Secondary | ICD-10-CM | POA: Diagnosis not present

## 2022-06-17 DIAGNOSIS — R590 Localized enlarged lymph nodes: Secondary | ICD-10-CM | POA: Diagnosis not present

## 2022-06-21 DIAGNOSIS — B029 Zoster without complications: Secondary | ICD-10-CM | POA: Diagnosis not present

## 2022-07-01 DIAGNOSIS — E1165 Type 2 diabetes mellitus with hyperglycemia: Secondary | ICD-10-CM | POA: Diagnosis not present

## 2022-07-01 DIAGNOSIS — N179 Acute kidney failure, unspecified: Secondary | ICD-10-CM | POA: Diagnosis not present

## 2022-07-01 DIAGNOSIS — D518 Other vitamin B12 deficiency anemias: Secondary | ICD-10-CM | POA: Diagnosis not present

## 2022-07-01 DIAGNOSIS — E782 Mixed hyperlipidemia: Secondary | ICD-10-CM | POA: Diagnosis not present

## 2022-07-01 DIAGNOSIS — N401 Enlarged prostate with lower urinary tract symptoms: Secondary | ICD-10-CM | POA: Diagnosis not present

## 2022-07-01 DIAGNOSIS — F418 Other specified anxiety disorders: Secondary | ICD-10-CM | POA: Diagnosis not present

## 2022-07-01 DIAGNOSIS — I10 Essential (primary) hypertension: Secondary | ICD-10-CM | POA: Diagnosis not present

## 2022-07-01 DIAGNOSIS — E559 Vitamin D deficiency, unspecified: Secondary | ICD-10-CM | POA: Diagnosis not present

## 2022-07-01 DIAGNOSIS — E038 Other specified hypothyroidism: Secondary | ICD-10-CM | POA: Diagnosis not present

## 2022-07-05 DIAGNOSIS — I10 Essential (primary) hypertension: Secondary | ICD-10-CM | POA: Diagnosis not present

## 2022-07-27 DIAGNOSIS — E1165 Type 2 diabetes mellitus with hyperglycemia: Secondary | ICD-10-CM | POA: Diagnosis not present

## 2022-07-27 DIAGNOSIS — D518 Other vitamin B12 deficiency anemias: Secondary | ICD-10-CM | POA: Diagnosis not present

## 2022-07-27 DIAGNOSIS — N179 Acute kidney failure, unspecified: Secondary | ICD-10-CM | POA: Diagnosis not present

## 2022-07-27 DIAGNOSIS — E559 Vitamin D deficiency, unspecified: Secondary | ICD-10-CM | POA: Diagnosis not present

## 2022-07-27 DIAGNOSIS — M545 Low back pain, unspecified: Secondary | ICD-10-CM | POA: Diagnosis not present

## 2022-07-27 DIAGNOSIS — I10 Essential (primary) hypertension: Secondary | ICD-10-CM | POA: Diagnosis not present

## 2022-07-27 DIAGNOSIS — N209 Urinary calculus, unspecified: Secondary | ICD-10-CM | POA: Diagnosis not present

## 2022-07-27 DIAGNOSIS — E782 Mixed hyperlipidemia: Secondary | ICD-10-CM | POA: Diagnosis not present

## 2022-07-27 DIAGNOSIS — F418 Other specified anxiety disorders: Secondary | ICD-10-CM | POA: Diagnosis not present

## 2022-07-27 DIAGNOSIS — N401 Enlarged prostate with lower urinary tract symptoms: Secondary | ICD-10-CM | POA: Diagnosis not present

## 2022-07-27 DIAGNOSIS — R300 Dysuria: Secondary | ICD-10-CM | POA: Diagnosis not present

## 2022-07-27 DIAGNOSIS — E038 Other specified hypothyroidism: Secondary | ICD-10-CM | POA: Diagnosis not present

## 2022-08-16 DIAGNOSIS — D518 Other vitamin B12 deficiency anemias: Secondary | ICD-10-CM | POA: Diagnosis not present

## 2022-08-30 DIAGNOSIS — F419 Anxiety disorder, unspecified: Secondary | ICD-10-CM | POA: Diagnosis not present

## 2022-09-04 DIAGNOSIS — I10 Essential (primary) hypertension: Secondary | ICD-10-CM | POA: Diagnosis not present

## 2022-09-06 DIAGNOSIS — C4441 Basal cell carcinoma of skin of scalp and neck: Secondary | ICD-10-CM | POA: Diagnosis not present

## 2022-09-06 DIAGNOSIS — D485 Neoplasm of uncertain behavior of skin: Secondary | ICD-10-CM | POA: Diagnosis not present

## 2022-09-07 DIAGNOSIS — I10 Essential (primary) hypertension: Secondary | ICD-10-CM | POA: Diagnosis not present

## 2022-09-07 DIAGNOSIS — K219 Gastro-esophageal reflux disease without esophagitis: Secondary | ICD-10-CM | POA: Diagnosis not present

## 2022-09-07 DIAGNOSIS — Z125 Encounter for screening for malignant neoplasm of prostate: Secondary | ICD-10-CM | POA: Diagnosis not present

## 2022-09-07 DIAGNOSIS — E038 Other specified hypothyroidism: Secondary | ICD-10-CM | POA: Diagnosis not present

## 2022-09-07 DIAGNOSIS — E782 Mixed hyperlipidemia: Secondary | ICD-10-CM | POA: Diagnosis not present

## 2022-09-07 DIAGNOSIS — M150 Primary generalized (osteo)arthritis: Secondary | ICD-10-CM | POA: Diagnosis not present

## 2022-09-07 DIAGNOSIS — N401 Enlarged prostate with lower urinary tract symptoms: Secondary | ICD-10-CM | POA: Diagnosis not present

## 2022-09-07 DIAGNOSIS — E1165 Type 2 diabetes mellitus with hyperglycemia: Secondary | ICD-10-CM | POA: Diagnosis not present

## 2022-09-07 DIAGNOSIS — D518 Other vitamin B12 deficiency anemias: Secondary | ICD-10-CM | POA: Diagnosis not present

## 2022-09-07 DIAGNOSIS — E559 Vitamin D deficiency, unspecified: Secondary | ICD-10-CM | POA: Diagnosis not present

## 2022-09-07 DIAGNOSIS — Z79899 Other long term (current) drug therapy: Secondary | ICD-10-CM | POA: Diagnosis not present

## 2022-09-09 DIAGNOSIS — E785 Hyperlipidemia, unspecified: Secondary | ICD-10-CM | POA: Diagnosis not present

## 2022-09-09 DIAGNOSIS — Z7984 Long term (current) use of oral hypoglycemic drugs: Secondary | ICD-10-CM | POA: Diagnosis not present

## 2022-09-09 DIAGNOSIS — E1165 Type 2 diabetes mellitus with hyperglycemia: Secondary | ICD-10-CM | POA: Diagnosis not present

## 2022-09-09 DIAGNOSIS — E1140 Type 2 diabetes mellitus with diabetic neuropathy, unspecified: Secondary | ICD-10-CM | POA: Diagnosis not present

## 2022-09-09 DIAGNOSIS — E038 Other specified hypothyroidism: Secondary | ICD-10-CM | POA: Diagnosis not present

## 2022-09-09 DIAGNOSIS — N179 Acute kidney failure, unspecified: Secondary | ICD-10-CM | POA: Diagnosis not present

## 2022-09-09 DIAGNOSIS — E559 Vitamin D deficiency, unspecified: Secondary | ICD-10-CM | POA: Diagnosis not present

## 2022-09-09 DIAGNOSIS — N401 Enlarged prostate with lower urinary tract symptoms: Secondary | ICD-10-CM | POA: Diagnosis not present

## 2022-09-09 DIAGNOSIS — Z794 Long term (current) use of insulin: Secondary | ICD-10-CM | POA: Diagnosis not present

## 2022-09-09 DIAGNOSIS — I10 Essential (primary) hypertension: Secondary | ICD-10-CM | POA: Diagnosis not present

## 2022-09-09 DIAGNOSIS — E782 Mixed hyperlipidemia: Secondary | ICD-10-CM | POA: Diagnosis not present

## 2022-09-09 DIAGNOSIS — D518 Other vitamin B12 deficiency anemias: Secondary | ICD-10-CM | POA: Diagnosis not present

## 2022-09-09 DIAGNOSIS — F418 Other specified anxiety disorders: Secondary | ICD-10-CM | POA: Diagnosis not present

## 2022-12-20 ENCOUNTER — Emergency Department (HOSPITAL_COMMUNITY): Payer: Medicare HMO

## 2022-12-20 ENCOUNTER — Encounter (HOSPITAL_COMMUNITY): Payer: Self-pay

## 2022-12-20 ENCOUNTER — Inpatient Hospital Stay (HOSPITAL_COMMUNITY)
Admission: EM | Admit: 2022-12-20 | Discharge: 2022-12-26 | DRG: 522 | Disposition: A | Discharge status: Home Health | Payer: Medicare HMO | Attending: Internal Medicine | Admitting: Internal Medicine

## 2022-12-20 DIAGNOSIS — W19XXXA Unspecified fall, initial encounter: Principal | ICD-10-CM | POA: Insufficient documentation

## 2022-12-20 DIAGNOSIS — W11XXXA Fall on and from ladder, initial encounter: Secondary | ICD-10-CM | POA: Diagnosis present

## 2022-12-20 DIAGNOSIS — Y92007 Garden or yard of unspecified non-institutional (private) residence as the place of occurrence of the external cause: Secondary | ICD-10-CM

## 2022-12-20 DIAGNOSIS — N400 Enlarged prostate without lower urinary tract symptoms: Secondary | ICD-10-CM | POA: Diagnosis present

## 2022-12-20 DIAGNOSIS — Z794 Long term (current) use of insulin: Secondary | ICD-10-CM

## 2022-12-20 DIAGNOSIS — E785 Hyperlipidemia, unspecified: Secondary | ICD-10-CM | POA: Diagnosis present

## 2022-12-20 DIAGNOSIS — E119 Type 2 diabetes mellitus without complications: Secondary | ICD-10-CM

## 2022-12-20 DIAGNOSIS — K219 Gastro-esophageal reflux disease without esophagitis: Secondary | ICD-10-CM | POA: Diagnosis present

## 2022-12-20 DIAGNOSIS — D62 Acute posthemorrhagic anemia: Secondary | ICD-10-CM | POA: Diagnosis present

## 2022-12-20 DIAGNOSIS — M25552 Pain in left hip: Secondary | ICD-10-CM | POA: Diagnosis present

## 2022-12-20 DIAGNOSIS — E876 Hypokalemia: Secondary | ICD-10-CM | POA: Diagnosis present

## 2022-12-20 DIAGNOSIS — E1122 Type 2 diabetes mellitus with diabetic chronic kidney disease: Secondary | ICD-10-CM | POA: Diagnosis present

## 2022-12-20 DIAGNOSIS — S72012A Unspecified intracapsular fracture of left femur, initial encounter for closed fracture: Secondary | ICD-10-CM | POA: Diagnosis present

## 2022-12-20 DIAGNOSIS — Z7984 Long term (current) use of oral hypoglycemic drugs: Secondary | ICD-10-CM | POA: Diagnosis not present

## 2022-12-20 DIAGNOSIS — Z833 Family history of diabetes mellitus: Secondary | ICD-10-CM

## 2022-12-20 DIAGNOSIS — Z8249 Family history of ischemic heart disease and other diseases of the circulatory system: Secondary | ICD-10-CM

## 2022-12-20 DIAGNOSIS — S72042A Displaced fracture of base of neck of left femur, initial encounter for closed fracture: Secondary | ICD-10-CM | POA: Diagnosis not present

## 2022-12-20 DIAGNOSIS — S72002A Fracture of unspecified part of neck of left femur, initial encounter for closed fracture: Secondary | ICD-10-CM | POA: Diagnosis present

## 2022-12-20 DIAGNOSIS — I10 Essential (primary) hypertension: Secondary | ICD-10-CM | POA: Diagnosis present

## 2022-12-20 DIAGNOSIS — N179 Acute kidney failure, unspecified: Secondary | ICD-10-CM | POA: Diagnosis present

## 2022-12-20 DIAGNOSIS — S098XXA Other specified injuries of head, initial encounter: Secondary | ICD-10-CM

## 2022-12-20 HISTORY — DX: Benign prostatic hyperplasia without lower urinary tract symptoms: N40.0

## 2022-12-20 HISTORY — DX: Fracture of unspecified part of neck of left femur, initial encounter for closed fracture: S72.002A

## 2022-12-20 HISTORY — DX: Unspecified fall, initial encounter: W19.XXXA

## 2022-12-20 MED ORDER — ACETAMINOPHEN 325 MG PO TABS
650.0000 mg | ORAL_TABLET | Freq: Four times a day (QID) | ORAL | Status: DC | PRN
  Filled 2022-12-20: qty 2

## 2022-12-20 MED ORDER — MELATONIN 3 MG PO TABS
3.0000 mg | ORAL_TABLET | Freq: Every evening | ORAL | Status: DC | PRN
  Administered 2022-12-21 – 2022-12-25 (×2): 3 mg via ORAL
  Filled 2022-12-20 (×2): qty 1

## 2022-12-20 MED ORDER — INSULIN ASPART 100 UNIT/ML IJ SOLN
0.0000 [IU] | Freq: Four times a day (QID) | INTRAMUSCULAR | Status: DC
  Administered 2022-12-21: 5 [IU] via SUBCUTANEOUS

## 2022-12-20 MED ORDER — ACETAMINOPHEN 650 MG RE SUPP: 650.0000 mg | Freq: Four times a day (QID) | RECTAL | Status: DC | PRN

## 2022-12-20 MED ORDER — FENTANYL CITRATE PF 50 MCG/ML IJ SOSY
25.0000 ug | PREFILLED_SYRINGE | INTRAMUSCULAR | Status: DC | PRN
  Administered 2022-12-21: 25 ug via INTRAVENOUS
  Filled 2022-12-20: qty 1

## 2022-12-20 MED ORDER — NALOXONE HCL 0.4 MG/ML IJ SOLN: 0.4000 mg | INTRAMUSCULAR | Status: DC | PRN

## 2022-12-20 MED ORDER — ONDANSETRON HCL 4 MG/2ML IJ SOLN
4.0000 mg | Freq: Four times a day (QID) | INTRAMUSCULAR | Status: DC | PRN
  Administered 2022-12-20 – 2022-12-21 (×2): 4 mg via INTRAVENOUS
  Filled 2022-12-20 (×2): qty 2

## 2022-12-20 MED ORDER — OXYCODONE HCL 5 MG PO TABS
10.0000 mg | ORAL_TABLET | Freq: Once | ORAL | Status: AC
  Administered 2022-12-20: 10 mg via ORAL
  Filled 2022-12-20: qty 2

## 2022-12-20 MED ORDER — FENTANYL CITRATE PF 50 MCG/ML IJ SOSY
50.0000 ug | PREFILLED_SYRINGE | INTRAMUSCULAR | Status: DC | PRN
  Administered 2022-12-20: 50 ug via INTRAVENOUS
  Filled 2022-12-20: qty 1

## 2022-12-20 NOTE — ED Notes (Signed)
Patient returned from CT

## 2022-12-20 NOTE — ED Notes (Signed)
ED TO INPATIENT HANDOFF REPORT  ED Nurse Name and Phone #: Juliette Alcide RN [phone]  S Name/Age/Gender Luke Edwards 82 y.o. male Room/Bed: RESUSC/RESUSC  Code Status   Code Status: Full Code  Home/SNF/Other Home Patient oriented to: self, place, time, and situation Is this baseline? Yes   Triage Complete: Triage complete  Chief Complaint Closed left hip fracture (HCC) [S72.002A]  Triage Note Pt BIB [facility] from home with c/o fall about 60 minutes prior to arrival. Pt was on second or third step when he sustained a mechanical fall, falling backwards onto left side. L hip, L knee, and L foot pain. EMS reports some shortening to LLE. Pt did hit left side of head but denies LOC. Not thinners. 100 mcg IV fentanyl given en route. 18 ga R AC.  EMS Vitals HR 85 BP 174/92 O2 94% room air RR 20 CBG 204   Allergies No Known Allergies  Level of Care/Admitting Diagnosis ED Disposition     ED Disposition  Admit   Condition  --   Comment  Hospital Area: [facility] [[id number]]  Level of Care: Med-Surg [16]  May admit patient to [facility] or [facility] if equivalent level of care is available:: No  Covid Evaluation: Asymptomatic - no recent exposure (last 10 days) testing not required  Diagnosis: Closed left hip fracture [facility]) [[id number]]  Admitting Physician: Angie Fava [[id number]]  Attending Physician: Angie Fava [[id number]]  Certification:: I certify this patient will need inpatient services for at least 2 midnights  Expected Medical Readiness: 12/22/2022          B Medical/Surgery History Past Medical History:  Diagnosis Date   COVID-19    Diabetes mellitus without complication (HCC)    GERD (gastroesophageal reflux disease)    HLD (hyperlipidemia)    Hypertension    Past Surgical History:  Procedure Laterality Date   APPENDECTOMY     CHOLECYSTECTOMY       A IV Location/Drains/Wounds Patient Lines/Drains/Airways Status      Active Line/Drains/Airways     Name Placement date Placement time Site Days   Peripheral IV 12/20/22 18 G Right Antecubital 12/20/22  1909  Antecubital  less than 1            Intake/Output Last 24 hours No intake or output data in the 24 hours ending 12/20/22 2337  Labs/Imaging No results found for this or any previous visit (from the past 48 hour(s)). CT Head Wo Contrast  Result Date: 12/20/2022 CLINICAL DATA:  Head trauma, minor (Age >= 65y); Neck trauma (Age >= 65y), fall from roof EXAM: CT HEAD WITHOUT CONTRAST CT CERVICAL SPINE WITHOUT CONTRAST TECHNIQUE: Multidetector CT imaging of the head and cervical spine was performed following the standard protocol without intravenous contrast. Multiplanar CT image reconstructions of the cervical spine were also generated. RADIATION DOSE REDUCTION: This exam was performed according to the departmental dose-optimization program which includes automated exposure control, adjustment of the mA and/or kV according to patient size and/or use of iterative reconstruction technique. COMPARISON:  None Available. FINDINGS: CT HEAD FINDINGS Brain: Normal anatomic configuration. Parenchymal volume loss is commensurate with the patient's age. Moderate periventricular white matter changes are present likely reflecting the sequela of small vessel ischemia. Remote left thalamic infarct again noted. No abnormal intra or extra-axial mass lesion or fluid collection. No abnormal mass effect or midline shift. No evidence of acute intracranial hemorrhage or infarct. Ventricular size is normal. Cerebellum unremarkable. Vascular: No asymmetric hyperdense  vasculature at the skull base. Skull: Intact Sinuses/Orbits: Right maxillary antrectomy has been performed. Mucous retention cyst noted within the visualized left maxillary sinus. Otherwise, paranasal sinuses are clear. Orbits are unremarkable. Other: Mastoid air cells and middle ear cavities are clear. CT CERVICAL SPINE  FINDINGS Alignment: Normal. Skull base and vertebrae: No acute fracture. No primary bone lesion or focal pathologic process. Soft tissues and spinal canal: No prevertebral fluid or swelling. No visible canal hematoma. Posterior disc osteophyte complex at C4-5 and posterior disc bulge at C3-4 in combination with laminar hypertrophy and mild shortening of the pedicles results in mild central canal stenosis with flattening of the thecal sac. Spinal canal is otherwise widely patent. Disc levels: There is intervertebral disc space narrowing and endplate remodeling of C4-C7 in keeping with changes of mild to moderate degenerative disc disease, most severe at C4-5. Prevertebral soft tissues are not thickened on sagittal reformats. Multilevel uncovertebral and facet arthrosis results in multilevel moderate to severe neuroforaminal narrowing, most severe bilaterally at C4-5, on the left at C5-6, and bilaterally at C6-7. Upper chest: Negative. Other: None IMPRESSION: 1. No acute intracranial abnormality. No calvarial fracture. 2. Stable senescent change and remote left thalamic infarct. 3. No acute fracture or listhesis of the cervical spine. 4. Multilevel degenerative disc and degenerative joint disease resulting in multilevel moderate to severe neuroforaminal narrowing as described above. Electronically Signed   By: Helyn Numbers M.D.   On: 12/20/2022 21:57   CT Cervical Spine Wo Contrast  Result Date: 12/20/2022 CLINICAL DATA:  Head trauma, minor (Age >= 65y); Neck trauma (Age >= 65y), fall from roof EXAM: CT HEAD WITHOUT CONTRAST CT CERVICAL SPINE WITHOUT CONTRAST TECHNIQUE: Multidetector CT imaging of the head and cervical spine was performed following the standard protocol without intravenous contrast. Multiplanar CT image reconstructions of the cervical spine were also generated. RADIATION DOSE REDUCTION: This exam was performed according to the departmental dose-optimization program which includes automated  exposure control, adjustment of the mA and/or kV according to patient size and/or use of iterative reconstruction technique. COMPARISON:  None Available. FINDINGS: CT HEAD FINDINGS Brain: Normal anatomic configuration. Parenchymal volume loss is commensurate with the patient's age. Moderate periventricular white matter changes are present likely reflecting the sequela of small vessel ischemia. Remote left thalamic infarct again noted. No abnormal intra or extra-axial mass lesion or fluid collection. No abnormal mass effect or midline shift. No evidence of acute intracranial hemorrhage or infarct. Ventricular size is normal. Cerebellum unremarkable. Vascular: No asymmetric hyperdense vasculature at the skull base. Skull: Intact Sinuses/Orbits: Right maxillary antrectomy has been performed. Mucous retention cyst noted within the visualized left maxillary sinus. Otherwise, paranasal sinuses are clear. Orbits are unremarkable. Other: Mastoid air cells and middle ear cavities are clear. CT CERVICAL SPINE FINDINGS Alignment: Normal. Skull base and vertebrae: No acute fracture. No primary bone lesion or focal pathologic process. Soft tissues and spinal canal: No prevertebral fluid or swelling. No visible canal hematoma. Posterior disc osteophyte complex at C4-5 and posterior disc bulge at C3-4 in combination with laminar hypertrophy and mild shortening of the pedicles results in mild central canal stenosis with flattening of the thecal sac. Spinal canal is otherwise widely patent. Disc levels: There is intervertebral disc space narrowing and endplate remodeling of C4-C7 in keeping with changes of mild to moderate degenerative disc disease, most severe at C4-5. Prevertebral soft tissues are not thickened on sagittal reformats. Multilevel uncovertebral and facet arthrosis results in multilevel moderate to severe neuroforaminal narrowing, most severe bilaterally  at C4-5, on the left at C5-6, and bilaterally at C6-7. Upper  chest: Negative. Other: None IMPRESSION: 1. No acute intracranial abnormality. No calvarial fracture. 2. Stable senescent change and remote left thalamic infarct. 3. No acute fracture or listhesis of the cervical spine. 4. Multilevel degenerative disc and degenerative joint disease resulting in multilevel moderate to severe neuroforaminal narrowing as described above. Electronically Signed   By: Helyn Numbers M.D.   On: 12/20/2022 21:57   CT Hip Left Wo Contrast  Result Date: 12/20/2022 CLINICAL DATA:  Left hip trauma and pain.  Concern for fracture. EXAM: CT OF THE LEFT HIP WITHOUT CONTRAST TECHNIQUE: Multidetector CT imaging of the left hip was performed according to the standard protocol. Multiplanar CT image reconstructions were also generated. RADIATION DOSE REDUCTION: This exam was performed according to the departmental dose-optimization program which includes automated exposure control, adjustment of the mA and/or kV according to patient size and/or use of iterative reconstruction technique. COMPARISON:  None Available. FINDINGS: Bones/Joint/Cartilage There is a minimally displaced and impacted fracture of the left femoral neck. The bones are mildly osteopenic. There is no dislocation. No joint effusion. Ligaments Suboptimally assessed by CT. Muscles and Tendons No intramuscular fluid collection or hematoma. Soft tissues Sigmoid diverticulosis. IMPRESSION: Minimally displaced and impacted fracture of the left femoral neck. Electronically Signed   By: Elgie Collard M.D.   On: 12/20/2022 21:55   DG Chest Portable 1 View  Result Date: 12/20/2022 CLINICAL DATA:  Fall EXAM: PORTABLE CHEST 1 VIEW COMPARISON:  None Available. FINDINGS: Mild bronchitic changes are seen centrally, new since prior examination. No confluent pulmonary infiltrate. No pneumothorax or pleural effusion. Cardiac size within normal limits. No acute bone abnormality. IMPRESSION: 1. Mild bronchitic changes. Electronically Signed    By: Helyn Numbers M.D.   On: 12/20/2022 20:36    Pending Labs Unresulted Labs (From admission, onward)     Start     Ordered   12/21/22 0500  CBC with Differential/Platelet  Tomorrow morning,   R        12/20/22 2315   12/21/22 0500  Comprehensive metabolic panel  Tomorrow morning,   R        12/20/22 2315   12/21/22 0500  Magnesium  Tomorrow morning,   R        12/20/22 2315   12/21/22 0500  Protime-INR  Tomorrow morning,   R        12/20/22 2318   12/20/22 2334  Hemoglobin A1c  Add-on,   AD       Comments: To assess prior glycemic control    12/20/22 2334   12/20/22 2333  Urinalysis, Complete w Microscopic -Urine, Clean Catch  Once,   R       Question:  Specimen Source  Answer:  Urine, Clean Catch   12/20/22 2332   12/20/22 2333  VITAMIN D 25 Hydroxy (Vit-D Deficiency, Fractures)  Add-on,   AD        12/20/22 2332   12/20/22 2316  Magnesium  Add-on,   AD        12/20/22 2315   12/20/22 2257  CBC with Differential  Once,   STAT        12/20/22 2256   12/20/22 2257  Basic metabolic panel  Once,   STAT        12/20/22 2256   12/20/22 2257  Type and screen [facility]  Once,   STAT       Comments:  [facility]    12/20/22 2256            Vitals/Pain Today's Vitals   12/20/22 1910 12/20/22 1911 12/20/22 2054 12/20/22 2200  BP:  (!) 126/102  (!) 164/87  Pulse:  76  88  Resp:  16  20  Temp:  97.8 F (36.6 C)    TempSrc:  Temporal    SpO2:  99%  99%  PainSc: 8   9      Isolation Precautions No active isolations  Medications Medications  acetaminophen (TYLENOL) tablet 650 mg (has no administration in time range)    Or  acetaminophen (TYLENOL) suppository 650 mg (has no administration in time range)  melatonin tablet 3 mg (has no administration in time range)  ondansetron (ZOFRAN) injection 4 mg (has no administration in time range)  naloxone (NARCAN) injection 0.4 mg (has no administration in time range)  fentaNYL (SUBLIMAZE)  injection 25 mcg (has no administration in time range)  insulin aspart (novoLOG) injection 0-9 Units (has no administration in time range)  oxyCODONE (Oxy IR/ROXICODONE) immediate release tablet 10 mg (10 mg Oral Given 12/20/22 2254)    Mobility non-ambulatory     Focused Assessments    R Recommendations: See Admitting Provider Note  Report given to:   Additional Notes: Pt A&Ox4. Pt normally ambulatory but currently non-ambulatory due to injury. Continent and using urinal. Pt has movement in left leg but painful.

## 2022-12-20 NOTE — ED Provider Notes (Signed)
[place] EMERGENCY DEPARTMENT AT [facility] Provider Note   CSN: [id number] Arrival date & time: 12/20/22  1904     History  Chief Complaint  Patient presents with   Fall   Leg Pain    Left    Decorius Outwater Eppinger is a 82 y.o. male.  Patient is an 82 year old male presenting for mechanical fall with hip pain.  Patient states he went up 3 steps of a ladder when he fell backwards hitting his head.  He admits to severe left-sided anterior pelvis pain.  He denies any sensation or motor deficits.  He denies headaches, nausea, vomiting.  He denies blood thinner use.  He denies back pain.  The history is provided by the patient. No language interpreter was used.  Fall Pertinent negatives include no chest pain, no abdominal pain and no shortness of breath.  Leg Pain Associated symptoms: no back pain and no fever        Home Medications Prior to Admission medications   Medication Sig Start Date End Date Taking? Authorizing Provider  aspirin EC 81 MG tablet Take 81 mg by mouth daily.    [provider]  Cyanocobalamin (VITAMIN B-12) 2500 MCG SUBL Place 2,500 mcg under the tongue daily.    [provider]  famotidine (PEPCID) 20 MG tablet Take 20 mg by mouth daily. 03/04/19   [provider]  feeding supplement, GLUCERNA SHAKE, (GLUCERNA SHAKE) LIQD Take 237 mLs by mouth 3 (three) times daily between meals. 06/27/19   Swayze, Ava, DO  finasteride (PROSCAR) 5 MG tablet Take 5 mg by mouth at bedtime.  12/11/18   [provider]  glipiZIDE (GLUCOTROL XL) 10 MG 24 hr tablet Take 10 mg by mouth daily.  03/10/17   [provider]  insulin glargine (LANTUS) 100 UNIT/ML injection Inject 0.2 mLs (20 Units total) into the skin 2 (two) times daily for 4 days, THEN 0.18 mLs (18 Units total) 2 (two) times daily for 3 days, THEN 0.15 mLs (15 Units total) 2 (two) times daily for 3 days, THEN 0.12 mLs (12 Units total) 2 (two) times daily for 3 days,  THEN 0.08 mLs (8 Units total) 2 (two) times daily for 3 days, THEN 0.05 mLs (5 Units total) 2 (two) times daily for 3 days. 06/27/19 07/16/19  Swayze, Ava, DO  insulin regular (NOVOLIN R) 100 units/mL injection Inject 2-10 Units into the skin See admin instructions. Inject 2-10 units per sliding scale three times daily before meals: 150-200=2u, 201-250=4u, 251-300=6u, 301-350=8u, 351-400=10    [provider]  lisinopril-hydrochlorothiazide (PRINZIDE,ZESTORETIC) 20-25 MG per tablet Take 1 tablet by mouth daily.    [provider]  metFORMIN (GLUCOPHAGE) 1000 MG tablet Take 1,000 mg by mouth 2 (two) times daily.  02/26/17   [provider]  Multiple Vitamin (MULTIVITAMIN WITH MINERALS) TABS tablet Take 1 tablet by mouth daily.    [provider]  polyethylene glycol (MIRALAX / GLYCOLAX) 17 g packet Take 17 g by mouth daily. 06/27/19   Swayze, Ava, DO  tamsulosin (FLOMAX) 0.4 MG CAPS capsule Take 0.4 mg by mouth daily after supper.  02/20/19   [provider]      Allergies    Patient has no known allergies.    Review of Systems   Review of Systems  Constitutional:  Negative for chills and fever.  HENT:  Negative for ear pain and sore throat.   Eyes:  Negative for pain and visual disturbance.  Respiratory:  Negative for cough and shortness of breath.   Cardiovascular:  Negative for chest pain and palpitations.  Gastrointestinal:  Negative for abdominal pain and vomiting.  Genitourinary:  Negative for dysuria and hematuria.  Musculoskeletal:  Negative for arthralgias and back pain.  Skin:  Negative for color change and rash.  Neurological:  Negative for seizures and syncope.  All other systems reviewed and are negative.   Physical Exam Updated Vital Signs BP (!) 164/87   Pulse 88   Temp 97.8 F (36.6 C) (Temporal)   Resp 20   SpO2 99%  Physical Exam Vitals and nursing note reviewed.  Constitutional:      General: He is not in acute  distress.    Appearance: He is well-developed.  HENT:     Head: Normocephalic and atraumatic.  Eyes:     Conjunctiva/sclera: Conjunctivae normal.  Cardiovascular:     Rate and Rhythm: Normal rate and regular rhythm.     Pulses:          Dorsalis pedis pulses are 2+ on the right side and 2+ on the left side.     Heart sounds: No murmur heard. Pulmonary:     Effort: Pulmonary effort is normal. No respiratory distress.     Breath sounds: Normal breath sounds.  Abdominal:     Palpations: Abdomen is soft.     Tenderness: There is no abdominal tenderness.  Musculoskeletal:        General: No swelling.     Right shoulder: Normal.     Left shoulder: Normal.     Right upper arm: Normal.     Left upper arm: Normal.     Right elbow: Normal.     Left elbow: Normal.     Right forearm: Normal.     Left forearm: Normal.     Cervical back: Neck supple. No bony tenderness.     Thoracic back: No bony tenderness.     Lumbar back: No bony tenderness.     Right hip: Normal.     Left hip: Tenderness and bony tenderness present. No deformity, lacerations or crepitus.     Right upper leg: Normal.     Left upper leg: Normal.     Right knee: Normal.     Left knee: Normal.     Right lower leg: Normal.     Left lower leg: Normal.     Right ankle: Normal.     Left ankle: Normal.  Skin:    General: Skin is warm and dry.     Capillary Refill: Capillary refill takes less than 2 seconds.  Neurological:     General: No focal deficit present.     Mental Status: He is alert and oriented to person, place, and time.     GCS: GCS eye subscore is 4. GCS verbal subscore is 5. GCS motor subscore is 6.     Cranial Nerves: Cranial nerves 2-12 are intact.     Sensory: Sensation is intact.     Motor: Motor function is intact.     Coordination: Coordination is intact.  Psychiatric:        Mood and Affect: Mood normal.     ED Results / Procedures / Treatments   Labs (all labs ordered are listed, but only  abnormal results are displayed) Labs Reviewed - No data to display  EKG None  Radiology CT Head Wo Contrast  Result Date: 12/20/2022 CLINICAL DATA:  Head trauma, minor (Age >= 65y); Neck trauma (  Age >= 65y), fall from roof EXAM: CT HEAD WITHOUT CONTRAST CT CERVICAL SPINE WITHOUT CONTRAST TECHNIQUE: Multidetector CT imaging of the head and cervical spine was performed following the standard protocol without intravenous contrast. Multiplanar CT image reconstructions of the cervical spine were also generated. RADIATION DOSE REDUCTION: This exam was performed according to the departmental dose-optimization program which includes automated exposure control, adjustment of the mA and/or kV according to patient size and/or use of iterative reconstruction technique. COMPARISON:  None Available. FINDINGS: CT HEAD FINDINGS Brain: Normal anatomic configuration. Parenchymal volume loss is commensurate with the patient's age. Moderate periventricular white matter changes are present likely reflecting the sequela of small vessel ischemia. Remote left thalamic infarct again noted. No abnormal intra or extra-axial mass lesion or fluid collection. No abnormal mass effect or midline shift. No evidence of acute intracranial hemorrhage or infarct. Ventricular size is normal. Cerebellum unremarkable. Vascular: No asymmetric hyperdense vasculature at the skull base. Skull: Intact Sinuses/Orbits: Right maxillary antrectomy has been performed. Mucous retention cyst noted within the visualized left maxillary sinus. Otherwise, paranasal sinuses are clear. Orbits are unremarkable. Other: Mastoid air cells and middle ear cavities are clear. CT CERVICAL SPINE FINDINGS Alignment: Normal. Skull base and vertebrae: No acute fracture. No primary bone lesion or focal pathologic process. Soft tissues and spinal canal: No prevertebral fluid or swelling. No visible canal hematoma. Posterior disc osteophyte complex at C4-5 and posterior disc  bulge at C3-4 in combination with laminar hypertrophy and mild shortening of the pedicles results in mild central canal stenosis with flattening of the thecal sac. Spinal canal is otherwise widely patent. Disc levels: There is intervertebral disc space narrowing and endplate remodeling of C4-C7 in keeping with changes of mild to moderate degenerative disc disease, most severe at C4-5. Prevertebral soft tissues are not thickened on sagittal reformats. Multilevel uncovertebral and facet arthrosis results in multilevel moderate to severe neuroforaminal narrowing, most severe bilaterally at C4-5, on the left at C5-6, and bilaterally at C6-7. Upper chest: Negative. Other: None IMPRESSION: 1. No acute intracranial abnormality. No calvarial fracture. 2. Stable senescent change and remote left thalamic infarct. 3. No acute fracture or listhesis of the cervical spine. 4. Multilevel degenerative disc and degenerative joint disease resulting in multilevel moderate to severe neuroforaminal narrowing as described above. Electronically Signed   By: Helyn Numbers M.D.   On: 12/20/2022 21:57   CT Cervical Spine Wo Contrast  Result Date: 12/20/2022 CLINICAL DATA:  Head trauma, minor (Age >= 65y); Neck trauma (Age >= 65y), fall from roof EXAM: CT HEAD WITHOUT CONTRAST CT CERVICAL SPINE WITHOUT CONTRAST TECHNIQUE: Multidetector CT imaging of the head and cervical spine was performed following the standard protocol without intravenous contrast. Multiplanar CT image reconstructions of the cervical spine were also generated. RADIATION DOSE REDUCTION: This exam was performed according to the departmental dose-optimization program which includes automated exposure control, adjustment of the mA and/or kV according to patient size and/or use of iterative reconstruction technique. COMPARISON:  None Available. FINDINGS: CT HEAD FINDINGS Brain: Normal anatomic configuration. Parenchymal volume loss is commensurate with the patient's age.  Moderate periventricular white matter changes are present likely reflecting the sequela of small vessel ischemia. Remote left thalamic infarct again noted. No abnormal intra or extra-axial mass lesion or fluid collection. No abnormal mass effect or midline shift. No evidence of acute intracranial hemorrhage or infarct. Ventricular size is normal. Cerebellum unremarkable. Vascular: No asymmetric hyperdense vasculature at the skull base. Skull: Intact Sinuses/Orbits: Right maxillary antrectomy has been performed.  Mucous retention cyst noted within the visualized left maxillary sinus. Otherwise, paranasal sinuses are clear. Orbits are unremarkable. Other: Mastoid air cells and middle ear cavities are clear. CT CERVICAL SPINE FINDINGS Alignment: Normal. Skull base and vertebrae: No acute fracture. No primary bone lesion or focal pathologic process. Soft tissues and spinal canal: No prevertebral fluid or swelling. No visible canal hematoma. Posterior disc osteophyte complex at C4-5 and posterior disc bulge at C3-4 in combination with laminar hypertrophy and mild shortening of the pedicles results in mild central canal stenosis with flattening of the thecal sac. Spinal canal is otherwise widely patent. Disc levels: There is intervertebral disc space narrowing and endplate remodeling of C4-C7 in keeping with changes of mild to moderate degenerative disc disease, most severe at C4-5. Prevertebral soft tissues are not thickened on sagittal reformats. Multilevel uncovertebral and facet arthrosis results in multilevel moderate to severe neuroforaminal narrowing, most severe bilaterally at C4-5, on the left at C5-6, and bilaterally at C6-7. Upper chest: Negative. Other: None IMPRESSION: 1. No acute intracranial abnormality. No calvarial fracture. 2. Stable senescent change and remote left thalamic infarct. 3. No acute fracture or listhesis of the cervical spine. 4. Multilevel degenerative disc and degenerative joint disease  resulting in multilevel moderate to severe neuroforaminal narrowing as described above. Electronically Signed   By: Helyn Numbers M.D.   On: 12/20/2022 21:57   CT Hip Left Wo Contrast  Result Date: 12/20/2022 CLINICAL DATA:  Left hip trauma and pain.  Concern for fracture. EXAM: CT OF THE LEFT HIP WITHOUT CONTRAST TECHNIQUE: Multidetector CT imaging of the left hip was performed according to the standard protocol. Multiplanar CT image reconstructions were also generated. RADIATION DOSE REDUCTION: This exam was performed according to the departmental dose-optimization program which includes automated exposure control, adjustment of the mA and/or kV according to patient size and/or use of iterative reconstruction technique. COMPARISON:  None Available. FINDINGS: Bones/Joint/Cartilage There is a minimally displaced and impacted fracture of the left femoral neck. The bones are mildly osteopenic. There is no dislocation. No joint effusion. Ligaments Suboptimally assessed by CT. Muscles and Tendons No intramuscular fluid collection or hematoma. Soft tissues Sigmoid diverticulosis. IMPRESSION: Minimally displaced and impacted fracture of the left femoral neck. Electronically Signed   By: Elgie Collard M.D.   On: 12/20/2022 21:55   DG Chest Portable 1 View  Result Date: 12/20/2022 CLINICAL DATA:  Fall EXAM: PORTABLE CHEST 1 VIEW COMPARISON:  None Available. FINDINGS: Mild bronchitic changes are seen centrally, new since prior examination. No confluent pulmonary infiltrate. No pneumothorax or pleural effusion. Cardiac size within normal limits. No acute bone abnormality. IMPRESSION: 1. Mild bronchitic changes. Electronically Signed   By: Helyn Numbers M.D.   On: 12/20/2022 20:36    Procedures Procedures    Medications Ordered in ED Medications  fentaNYL (SUBLIMAZE) injection 50 mcg (50 mcg Intravenous Given 12/20/22 2054)  oxyCODONE (Oxy IR/ROXICODONE) immediate release tablet 10 mg (has no  administration in time range)    ED Course/ Medical Decision Making/ A&P                                 Medical Decision Making Amount and/or Complexity of Data Reviewed Labs: ordered. Radiology: ordered.  Risk Prescription drug management. Decision regarding hospitalization.    82 year old male presenting for mechanical fall with hip pain.  Patient is alert and to x 3, no acute distress, afebrile, stable vital signs.  Physical  exam demonstrates no gross deformities to the lower extremities.  Sensation and motor function intact.  Equal bilateral dorsalis pedis pulses.  Patient has tenderness palpation in the left anterior pelvis region.  No abrasions or lacerations.  Patient is neurovascularly intact otherwise without any bleeding from the scalp from the head trauma.  No spinal midline back pain.  CT head and neck demonstrates no acute process.  CT hip demonstrates: Minimally displaced and impacted fracture of the left femoral neck. I spoke with Dr. Carola Frost who requests hospitalist admit and will see patient in the morning to discuss his options.   Patient accepted by admitting physician Dr. Arlean Hopping.            Final Clinical Impression(s) / ED Diagnoses Final diagnoses:  Fall, initial encounter  Blunt head trauma, initial encounter  Closed fracture of neck of left femur, initial encounter [facility])    Rx / DC Orders ED Discharge Orders     None         Franne Forts, DO 12/20/22 2311

## 2022-12-20 NOTE — H&P (Signed)
History and Physical      VALENTE KOKER ZOX:[id number] DOB: 03-May-1940 DOA: 12/20/2022; DOS: 12/20/2022  PCP: Galvin Proffer, MD  Patient coming from: home   I have personally briefly reviewed patient's old medical records in [facility]  Chief Complaint: left hip pain  HPI: Luke Edwards is a 82 y.o. male with medical history significant for type 2 diabetes mellitus, essential hypertension, who is admitted to [facility] on 12/20/2022 with acute left femoral neck fracture after presenting from home to [facility] ED complaining of left hip pain.   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 As a result of this fall, the patient reports immediate development of sharp left hip pain, with radiation into the left groin. States that this pain has been constant since onset, with exacerbation when attempting to move the left lower extremity.  As a consequence of the associated intensity of this discomfort, reports that he is unable to bear weight on the left lower extremity at this time.  This is relative to baseline functional status in which the patient lives independently as well as baseline ambulatory status in which the patient reports the ability to ambulate without assistance, including no need for support devices.  Otherwise, denies any acute arthralgias or myalgias as a result of the above fall.  Denies any acute numbness or paresthesias in bilateral lower extremities, and confirms that left hip representations a native joint.  This fall was witnessed by his friend. Patient does not believe that he hit his head as a component of this fall, and denies any associated loss of consciousness.  Denies any preceding or associated chest pain, shortness of breath, diaphoresis,  palpitations, nausea, vomiting, dizziness, presyncope, or syncope.  Denies any subsequent headache, neck pain, blurry vision, or diplopia.  He is on a daily baby aspirin, but otherwise, not on any additional blood thinners as an outpatient.   Denies any known history of coronary artery disease or CHF. denies any recent orthopnea, PND, or peripheral edema.   Denies any recent subjective fever, chills, rigors, or generalized myalgias.    ED Course:  Vital signs in the ED were notable for the following: Afebrile; heart rates in the 70s to 80s; systolic blood pressures in the 120s to 160s; respiratory rate 16-20, oxygen saturation 99 to 100% on room air.  Labs were notable for the following: BMP, CBC, and type and screen were ordered in the ED today, with results of these 3 sets of labs currently pending.  Per my interpretation, EKG in ED demonstrated the following: No EKG performed today.  Imaging in the ED, per corresponding formal radiology read, was notable for the following: 1 view chest x-ray showed mild bronchitis changes, in the absence of any evidence of infiltrate, edema, effusion, or pneumothorax.  Noncontrast CT head showed no evidence of acute intracranial process, including no evidence of intracranial hemorrhage or any evidence of acute infarct; CT cervical spine showed no evidence of acute cervical spine fracture or listhesis injury; CT of the left hip showed minimally displaced and impacted fracture of the left femoral neck.  EDP discussed patient's case/imaging with on-call orthopedic surgery, Dr. Carola Frost of the [facility], who conveys that ortho will formally consult and see the patient in  the AM (9/11), and requests that patient be kept NPO after MN for potential surgery to occur on 9/11.   While in the ED, the following were administered: Oxycodone IR 10 mg p.o. x 1 dose, fentanyl 50 mcg IV x 1 dose.  Subsequently, the patient was admitted for further evaluation  management of acute left femoral neck fracture stemming from mechanical fall earlier in the day.    Review of Systems: As per HPI otherwise 10 point review of systems negative.   Past Medical History:  Diagnosis Date   COVID-19    Diabetes mellitus without complication (HCC)    GERD (gastroesophageal reflux disease)    HLD (hyperlipidemia)    Hypertension     Past Surgical History:  Procedure Laterality Date   APPENDECTOMY     CHOLECYSTECTOMY      Social History:  reports that he has never smoked. He has never used smokeless tobacco. He reports current alcohol use of about 2.0 standard drinks of alcohol per week. He reports that he does not use drugs.   No Known Allergies  Family History  Problem Relation Age of Onset   Valvular heart disease Mother    Diabetes Mother    Heart attack Father        1's   Diabetes Daughter     Family history reviewed and not pertinent    Prior to Admission medications   Medication Sig Start Date End Date Taking? Authorizing Provider  aspirin EC 81 MG tablet Take 81 mg by mouth daily.    [provider]  Cyanocobalamin (VITAMIN B-12) 2500 MCG SUBL Place 2,500 mcg under the tongue daily.    [provider]  famotidine (PEPCID) 20 MG tablet Take 20 mg by mouth daily. 03/04/19   [provider]  feeding supplement, GLUCERNA SHAKE, (GLUCERNA SHAKE) LIQD Take 237 mLs by mouth 3 (three) times daily between meals. 06/27/19   Swayze, Ava, DO  finasteride (PROSCAR) 5 MG tablet Take 5 mg by mouth at bedtime.  12/11/18   [provider]  glipiZIDE (GLUCOTROL XL) 10 MG 24 hr tablet Take 10 mg by mouth daily.  03/10/17   [provider]  insulin glargine (LANTUS) 100 UNIT/ML injection Inject 0.2 mLs (20 Units total) into the skin 2 (two) times daily for 4 days, THEN 0.18 mLs (18 Units total) 2 (two) times daily for 3 days, THEN 0.15 mLs (15 Units total) 2 (two) times daily for 3 days, THEN 0.12 mLs (12 Units  total) 2 (two) times daily for 3 days, THEN 0.08 mLs (8 Units total) 2 (two) times daily for 3 days, THEN 0.05 mLs (5 Units total) 2 (two) times daily for 3 days. 06/27/19 07/16/19  Swayze, Ava, DO  insulin regular (NOVOLIN R) 100 units/mL injection Inject 2-10 Units into the skin See admin instructions. Inject 2-10 units per sliding scale three times daily before meals: 150-200=2u, 201-250=4u, 251-300=6u, 301-350=8u, 351-400=10    [provider]  lisinopril-hydrochlorothiazide (PRINZIDE,ZESTORETIC) 20-25 MG per tablet Take 1 tablet by mouth daily.    [provider]  metFORMIN (GLUCOPHAGE) 1000 MG tablet Take 1,000 mg by mouth 2 (two) times daily.  02/26/17   [provider]  Multiple Vitamin (MULTIVITAMIN WITH MINERALS) TABS tablet Take 1 tablet by mouth daily.    [provider]  polyethylene glycol (MIRALAX / GLYCOLAX) 17 g packet Take 17 g by mouth daily. 06/27/19   Swayze, Ava, DO  tamsulosin (FLOMAX) 0.4 MG CAPS capsule Take 0.4  mg by mouth daily after supper.  02/20/19   [provider]     Objective    Physical Exam: Vitals:   12/20/22 1911 12/20/22 2200  BP: (!) 126/102 (!) 164/87  Pulse: 76 88  Resp: 16 20  Temp: 97.8 F (36.6 C)   TempSrc: Temporal   SpO2: 99% 99%    General: appears to be stated age; alert, oriented Skin: warm, dry, no rash Head:  AT/[place] Mouth:  Oral mucosa membranes appear moist, normal dentition Neck: supple; trachea midline Heart:  RRR; did not appreciate any M/R/G Lungs: CTAB, did not appreciate any wheezes, rales, or rhonchi Abdomen: + BS; soft, ND, NT Vascular: 2+ pedal pulses b/l; 2+ radial pulses b/l Extremities: no peripheral edema, no muscle wasting; left lower extremity is externally rotated, and appears shorter than right counterpart Neuro: sensation intact in upper and lower extremities b/l; deferred evaluation of strength in the left lower extremity in the context of presenting acute left femoral  neck fracture     Labs on Admission: I have personally reviewed following labs and imaging studies  CBC: No results for input(s): "WBC", "NEUTROABS", "HGB", "HCT", "MCV", "PLT" in the last 168 hours. Basic Metabolic Panel: No results for input(s): "NA", "K", "CL", "CO2", "GLUCOSE", "BUN", "CREATININE", "CALCIUM", "MG", "PHOS" in the last 168 hours. GFR: CrCl cannot be calculated (Patient's most recent lab result is older than the maximum 21 days allowed.). Liver Function Tests: No results for input(s): "AST", "ALT", "ALKPHOS", "BILITOT", "PROT", "ALBUMIN" in the last 168 hours. No results for input(s): "LIPASE", "AMYLASE" in the last 168 hours. No results for input(s): "AMMONIA" in the last 168 hours. Coagulation Profile: No results for input(s): "INR", "PROTIME" in the last 168 hours. Cardiac Enzymes: No results for input(s): "CKTOTAL", "CKMB", "CKMBINDEX", "TROPONINI" in the last 168 hours. BNP (last 3 results) No results for input(s): "PROBNP" in the last 8760 hours. HbA1C: No results for input(s): "HGBA1C" in the last 72 hours. CBG: No results for input(s): "GLUCAP" in the last 168 hours. Lipid Profile: No results for input(s): "CHOL", "HDL", "LDLCALC", "TRIG", "CHOLHDL", "LDLDIRECT" in the last 72 hours. Thyroid Function Tests: No results for input(s): "TSH", "T4TOTAL", "FREET4", "T3FREE", "THYROIDAB" in the last 72 hours. Anemia Panel: No results for input(s): "VITAMINB12", "FOLATE", "FERRITIN", "TIBC", "IRON", "RETICCTPCT" in the last 72 hours. Urine analysis:    Component Value Date/Time   COLORURINE YELLOW 06/22/2019 0928   APPEARANCEUR CLEAR 06/22/2019 0928   LABSPEC 1.010 06/22/2019 0928   PHURINE 6.0 06/22/2019 0928   GLUCOSEU 50 (A) 06/22/2019 0928   HGBUR NEGATIVE 06/22/2019 0928   BILIRUBINUR NEGATIVE 06/22/2019 0928   KETONESUR NEGATIVE 06/22/2019 0928   PROTEINUR NEGATIVE 06/22/2019 0928   UROBILINOGEN 0.2 08/09/2013 1150   NITRITE NEGATIVE 06/22/2019  0928   LEUKOCYTESUR NEGATIVE 06/22/2019 0928    Radiological Exams on Admission: CT Head Wo Contrast  Result Date: 12/20/2022 CLINICAL DATA:  Head trauma, minor (Age >= 65y); Neck trauma (Age >= 65y), fall from roof EXAM: CT HEAD WITHOUT CONTRAST CT CERVICAL SPINE WITHOUT CONTRAST TECHNIQUE: Multidetector CT imaging of the head and cervical spine was performed following the standard protocol without intravenous contrast. Multiplanar CT image reconstructions of the cervical spine were also generated. RADIATION DOSE REDUCTION: This exam was performed according to the departmental dose-optimization program which includes automated exposure control, adjustment of the mA and/or kV according to patient size and/or use of iterative reconstruction technique. COMPARISON:  None Available. FINDINGS: CT HEAD FINDINGS Brain: Normal anatomic configuration. Parenchymal  volume loss is commensurate with the patient's age. Moderate periventricular white matter changes are present likely reflecting the sequela of small vessel ischemia. Remote left thalamic infarct again noted. No abnormal intra or extra-axial mass lesion or fluid collection. No abnormal mass effect or midline shift. No evidence of acute intracranial hemorrhage or infarct. Ventricular size is normal. Cerebellum unremarkable. Vascular: No asymmetric hyperdense vasculature at the skull base. Skull: Intact Sinuses/Orbits: Right maxillary antrectomy has been performed. Mucous retention cyst noted within the visualized left maxillary sinus. Otherwise, paranasal sinuses are clear. Orbits are unremarkable. Other: Mastoid air cells and middle ear cavities are clear. CT CERVICAL SPINE FINDINGS Alignment: Normal. Skull base and vertebrae: No acute fracture. No primary bone lesion or focal pathologic process. Soft tissues and spinal canal: No prevertebral fluid or swelling. No visible canal hematoma. Posterior disc osteophyte complex at C4-5 and posterior disc bulge at  C3-4 in combination with laminar hypertrophy and mild shortening of the pedicles results in mild central canal stenosis with flattening of the thecal sac. Spinal canal is otherwise widely patent. Disc levels: There is intervertebral disc space narrowing and endplate remodeling of C4-C7 in keeping with changes of mild to moderate degenerative disc disease, most severe at C4-5. Prevertebral soft tissues are not thickened on sagittal reformats. Multilevel uncovertebral and facet arthrosis results in multilevel moderate to severe neuroforaminal narrowing, most severe bilaterally at C4-5, on the left at C5-6, and bilaterally at C6-7. Upper chest: Negative. Other: None IMPRESSION: 1. No acute intracranial abnormality. No calvarial fracture. 2. Stable senescent change and remote left thalamic infarct. 3. No acute fracture or listhesis of the cervical spine. 4. Multilevel degenerative disc and degenerative joint disease resulting in multilevel moderate to severe neuroforaminal narrowing as described above. Electronically Signed   By: Helyn Numbers M.D.   On: 12/20/2022 21:57   CT Cervical Spine Wo Contrast  Result Date: 12/20/2022 CLINICAL DATA:  Head trauma, minor (Age >= 65y); Neck trauma (Age >= 65y), fall from roof EXAM: CT HEAD WITHOUT CONTRAST CT CERVICAL SPINE WITHOUT CONTRAST TECHNIQUE: Multidetector CT imaging of the head and cervical spine was performed following the standard protocol without intravenous contrast. Multiplanar CT image reconstructions of the cervical spine were also generated. RADIATION DOSE REDUCTION: This exam was performed according to the departmental dose-optimization program which includes automated exposure control, adjustment of the mA and/or kV according to patient size and/or use of iterative reconstruction technique. COMPARISON:  None Available. FINDINGS: CT HEAD FINDINGS Brain: Normal anatomic configuration. Parenchymal volume loss is commensurate with the patient's age. Moderate  periventricular white matter changes are present likely reflecting the sequela of small vessel ischemia. Remote left thalamic infarct again noted. No abnormal intra or extra-axial mass lesion or fluid collection. No abnormal mass effect or midline shift. No evidence of acute intracranial hemorrhage or infarct. Ventricular size is normal. Cerebellum unremarkable. Vascular: No asymmetric hyperdense vasculature at the skull base. Skull: Intact Sinuses/Orbits: Right maxillary antrectomy has been performed. Mucous retention cyst noted within the visualized left maxillary sinus. Otherwise, paranasal sinuses are clear. Orbits are unremarkable. Other: Mastoid air cells and middle ear cavities are clear. CT CERVICAL SPINE FINDINGS Alignment: Normal. Skull base and vertebrae: No acute fracture. No primary bone lesion or focal pathologic process. Soft tissues and spinal canal: No prevertebral fluid or swelling. No visible canal hematoma. Posterior disc osteophyte complex at C4-5 and posterior disc bulge at C3-4 in combination with laminar hypertrophy and mild shortening of the pedicles results in mild central canal stenosis with  flattening of the thecal sac. Spinal canal is otherwise widely patent. Disc levels: There is intervertebral disc space narrowing and endplate remodeling of C4-C7 in keeping with changes of mild to moderate degenerative disc disease, most severe at C4-5. Prevertebral soft tissues are not thickened on sagittal reformats. Multilevel uncovertebral and facet arthrosis results in multilevel moderate to severe neuroforaminal narrowing, most severe bilaterally at C4-5, on the left at C5-6, and bilaterally at C6-7. Upper chest: Negative. Other: None IMPRESSION: 1. No acute intracranial abnormality. No calvarial fracture. 2. Stable senescent change and remote left thalamic infarct. 3. No acute fracture or listhesis of the cervical spine. 4. Multilevel degenerative disc and degenerative joint disease resulting in  multilevel moderate to severe neuroforaminal narrowing as described above. Electronically Signed   By: Helyn Numbers M.D.   On: 12/20/2022 21:57   CT Hip Left Wo Contrast  Result Date: 12/20/2022 CLINICAL DATA:  Left hip trauma and pain.  Concern for fracture. EXAM: CT OF THE LEFT HIP WITHOUT CONTRAST TECHNIQUE: Multidetector CT imaging of the left hip was performed according to the standard protocol. Multiplanar CT image reconstructions were also generated. RADIATION DOSE REDUCTION: This exam was performed according to the departmental dose-optimization program which includes automated exposure control, adjustment of the mA and/or kV according to patient size and/or use of iterative reconstruction technique. COMPARISON:  None Available. FINDINGS: Bones/Joint/Cartilage There is a minimally displaced and impacted fracture of the left femoral neck. The bones are mildly osteopenic. There is no dislocation. No joint effusion. Ligaments Suboptimally assessed by CT. Muscles and Tendons No intramuscular fluid collection or hematoma. Soft tissues Sigmoid diverticulosis. IMPRESSION: Minimally displaced and impacted fracture of the left femoral neck. Electronically Signed   By: Elgie Collard M.D.   On: 12/20/2022 21:55   DG Chest Portable 1 View  Result Date: 12/20/2022 CLINICAL DATA:  Fall EXAM: PORTABLE CHEST 1 VIEW COMPARISON:  None Available. FINDINGS: Mild bronchitic changes are seen centrally, new since prior examination. No confluent pulmonary infiltrate. No pneumothorax or pleural effusion. Cardiac size within normal limits. No acute bone abnormality. IMPRESSION: 1. Mild bronchitic changes. Electronically Signed   By: Helyn Numbers M.D.   On: 12/20/2022 20:36      Assessment/Plan   Principal Problem:   Closed left hip fracture (HCC) Active Problems:   Essential hypertension   DM2 (diabetes mellitus, type 2) (HCC)   Fall at home, initial encounter   BPH (benign prostatic  hyperplasia)       #) Acute Left femoral neck fracture: confirmed via presenting plain films and stemming from  mechanical fall without associated loss of consciousness that occurred earlier on the day of admission, as further described above, resulting in immediate develop of acute left hip pain.  At this time, the left lower extremity appears neurovascularly intact, and patient reports adequate pain control.  On daily baby aspirin as an outpatient, in the absence of any additional blood thinners at home.    EDP discussed patient's case/imaging with on-call orthopedic surgery, Dr. Carola Frost of the [facility], who conveys that ortho will formally consult and see the patient in the AM (9/11), and requests that patient be kept NPO after MN for potential surgery to occur on 9/11.   Gupta Score for this patient in the context of anticipated aforementioned orthopedic surgery conveys a  0.76% perioperative risk for significant cardiac event. No evidence to suggest acutely decompensated heart failure or acute MI. Consequently, no absolute contraindications to proceeding with proposed orthopedic surgery  at this time.   Plan: Formal orthopedic surgery consult for definitive surgical management, as above. NPO after MN .  No pharmacologic anticoagulation leading up to this anticipated surgery.  Hold home daily baby aspirin.  SCD's. Prn IV fentanyl. Anticipate postoperative PT consult. Check INR. Pre-op EKG has been ordered. Check 25-hydroxy vit D level to assess for any underlying pathological contribution towards the patient's fracture stemming from associated vitamin deficiency. Type and screen ordered.  Incentive spirometry. Will follow for results of admission bmp, cbc findings.                #) mechanical fall: The patient reports a mechanical fall earlier today from the third step of the ladder in which he tripped without any associated loss of consciousness. Appears to be  purely mechanical in nature, without clinical evidence at this time to suggest contributory dizziness, presyncope, syncope, or acute ischemic CVA. Does not appear to have hit head as component of this fall. presenting CT head showed no evidence of acute intracranial process, including no evidence of intracranial hemorrhage, and presentation does not appear to be associated any acute neurologic deficits.  While this fall appears to be purely mechanical in nature, will also check urinalysis to evaluate for any underlying infectious contribution.   Plan: Check urinalysis, as above.  CMP and CBC with differential in the morning. Fall precautions. Anticipate postoperative PT consult.                    #) Type 2 Diabetes Mellitus: documented history of such. Home insulin regimen: Lantus 5 units subcu twice daily in addition to sliding scale regular insulin on a 3 times daily basis with meals, with associated average sliding scale dose noted to be 2 to 10 units. Home oral hypoglycemic agents: Glipizide, metformin. Most recent A1c noted to be 9.0% when checked in January 2021.  Plan: In the setting of current n.p.o. status, will pursue accuchecks on a every 6 hour basis with low dose SSI.  Hold home basal insulin for now. hold home oral hypoglycemic agents during this hospitalization.  Add on hemoglobin A1c level.                  #) Essential Hypertension: documented h/o such, with outpatient antihypertensive regimen including lisinopril, HCTZ.  SBP's in the ED today: 120s to 160s mmHg.   Plan: Close monitoring of subsequent BP via routine VS. in the setting of current n.p.o. status, will hold home lisinopril and HCTZ for now.                   #) Benign Prostatic Hyperplasia:  documented h/o such; on tamsulosin as well as Proscar as outpatient.   Plan: monitor strict I's & O's and daily weights. Repeat CMP in AM.  In the setting of current n.p.o. status,  will hold him tamsulosin and Proscar for now.      DVT prophylaxis: SCD's   Code Status: Full code Family Communication: none Disposition Plan: Per Rounding Team Consults called: EDP discussed patient's case/imaging with on-call orthopedic surgery, Dr. Carola Frost of the [facility];  Admission status: inpatient     I SPENT GREATER THAN 75  MINUTES IN CLINICAL CARE TIME/MEDICAL DECISION-MAKING IN COMPLETING THIS ADMISSION.      Chaney Born Merary Garguilo DO [facility]  From 7PM - 7AM   12/20/2022, 11:18 PM

## 2022-12-20 NOTE — ED Notes (Signed)
Patient transported to CT 

## 2022-12-20 NOTE — ED Triage Notes (Signed)
Pt BIB [facility] from home with c/o fall about 60 minutes prior to arrival. Pt was on second or third step when he sustained a mechanical fall, falling backwards onto left side. L hip, L knee, and L foot pain. EMS reports some shortening to LLE. Pt did hit left side of head but denies LOC. Not thinners. 100 mcg IV fentanyl given en route. 18 ga R AC.  EMS Vitals HR 85 BP 174/92 O2 94% room air RR 20 CBG 204

## 2022-12-20 NOTE — Progress Notes (Signed)
Consult request received for impacted left femoral neck fracture.  Have discussed with the requesting MD patient's stability, pain control, and ongoing work up. I have reviewed x-rays and CT scan. Patient will need repair or replacement. Full consultation to follow in the am by Charma Igo, PAC.   Please keep NPO.  [name], MD [facility], [facility] [phone]

## 2022-12-21 ENCOUNTER — Other Ambulatory Visit: Payer: Self-pay

## 2022-12-21 ENCOUNTER — Inpatient Hospital Stay (HOSPITAL_COMMUNITY): Payer: Medicare HMO | Admitting: Anesthesiology

## 2022-12-21 DIAGNOSIS — Z794 Long term (current) use of insulin: Secondary | ICD-10-CM | POA: Diagnosis not present

## 2022-12-21 DIAGNOSIS — S72002A Fracture of unspecified part of neck of left femur, initial encounter for closed fracture: Secondary | ICD-10-CM | POA: Diagnosis not present

## 2022-12-21 DIAGNOSIS — I10 Essential (primary) hypertension: Secondary | ICD-10-CM | POA: Diagnosis not present

## 2022-12-21 DIAGNOSIS — E119 Type 2 diabetes mellitus without complications: Secondary | ICD-10-CM | POA: Diagnosis not present

## 2022-12-21 LAB — GLUCOSE, CAPILLARY
Glucose-Capillary: 183 mg/dL — ABNORMAL HIGH (ref 70–99)
Glucose-Capillary: 186 mg/dL — ABNORMAL HIGH (ref 70–99)
Glucose-Capillary: 202 mg/dL — ABNORMAL HIGH (ref 70–99)
Glucose-Capillary: 218 mg/dL — ABNORMAL HIGH (ref 70–99)
Glucose-Capillary: 218 mg/dL — ABNORMAL HIGH (ref 70–99)
Glucose-Capillary: 269 mg/dL — ABNORMAL HIGH (ref 70–99)
Glucose-Capillary: 294 mg/dL — ABNORMAL HIGH (ref 70–99)

## 2022-12-21 LAB — CBC WITH DIFFERENTIAL/PLATELET
Abs Immature Granulocytes: 0.06 10*3/uL (ref 0.00–0.07)
Abs Immature Granulocytes: 0.07 10*3/uL (ref 0.00–0.07)
Basophils Absolute: 0 10*3/uL (ref 0.0–0.1)
Basophils Absolute: 0 10*3/uL (ref 0.0–0.1)
Basophils Relative: 0 %
Basophils Relative: 0 %
Eosinophils Absolute: 0 10*3/uL (ref 0.0–0.5)
Eosinophils Absolute: 0.1 10*3/uL (ref 0.0–0.5)
Eosinophils Relative: 0 %
Eosinophils Relative: 1 %
HCT: 36.5 % — ABNORMAL LOW (ref 39.0–52.0)
HCT: 36.7 % — ABNORMAL LOW (ref 39.0–52.0)
Hemoglobin: 12.7 g/dL — ABNORMAL LOW (ref 13.0–17.0)
Hemoglobin: 12.9 g/dL — ABNORMAL LOW (ref 13.0–17.0)
Immature Granulocytes: 1 %
Immature Granulocytes: 1 %
Lymphocytes Relative: 20 %
Lymphocytes Relative: 9 %
Lymphs Abs: 1.1 10*3/uL (ref 0.7–4.0)
Lymphs Abs: 3.1 10*3/uL (ref 0.7–4.0)
MCH: 31 pg (ref 26.0–34.0)
MCH: 31.8 pg (ref 26.0–34.0)
MCHC: 34.8 g/dL (ref 30.0–36.0)
MCHC: 35.1 g/dL (ref 30.0–36.0)
MCV: 89 fL (ref 80.0–100.0)
MCV: 90.4 fL (ref 80.0–100.0)
Monocytes Absolute: 0.7 10*3/uL (ref 0.1–1.0)
Monocytes Absolute: 1 10*3/uL (ref 0.1–1.0)
Monocytes Relative: 6 %
Monocytes Relative: 7 %
Neutro Abs: 10.7 10*3/uL — ABNORMAL HIGH (ref 1.7–7.7)
Neutro Abs: 11.1 10*3/uL — ABNORMAL HIGH (ref 1.7–7.7)
Neutrophils Relative %: 71 %
Neutrophils Relative %: 84 %
Platelets: 212 10*3/uL (ref 150–400)
Platelets: 230 10*3/uL (ref 150–400)
RBC: 4.06 MIL/uL — ABNORMAL LOW (ref 4.22–5.81)
RBC: 4.1 MIL/uL — ABNORMAL LOW (ref 4.22–5.81)
RDW: 12.4 % (ref 11.5–15.5)
RDW: 12.6 % (ref 11.5–15.5)
WBC: 12.6 10*3/uL — ABNORMAL HIGH (ref 4.0–10.5)
WBC: 15.4 10*3/uL — ABNORMAL HIGH (ref 4.0–10.5)
nRBC: 0 % (ref 0.0–0.2)
nRBC: 0 % (ref 0.0–0.2)

## 2022-12-21 LAB — COMPREHENSIVE METABOLIC PANEL
ALT: 17 U/L (ref 0–44)
AST: 16 U/L (ref 15–41)
Albumin: 3.7 g/dL (ref 3.5–5.0)
Alkaline Phosphatase: 54 U/L (ref 38–126)
Anion gap: 9 (ref 5–15)
BUN: 14 mg/dL (ref 8–23)
CO2: 23 mmol/L (ref 22–32)
Calcium: 8.3 mg/dL — ABNORMAL LOW (ref 8.9–10.3)
Chloride: 104 mmol/L (ref 98–111)
Creatinine, Ser: 1.36 mg/dL — ABNORMAL HIGH (ref 0.61–1.24)
GFR, Estimated: 52 mL/min — ABNORMAL LOW (ref >60)
Glucose, Bld: 293 mg/dL — ABNORMAL HIGH (ref 70–99)
Potassium: 3.4 mmol/L — ABNORMAL LOW (ref 3.5–5.1)
Sodium: 136 mmol/L (ref 135–145)
Total Bilirubin: 1.4 mg/dL — ABNORMAL HIGH (ref 0.3–1.2)
Total Protein: 6.5 g/dL (ref 6.5–8.1)

## 2022-12-21 LAB — PROTIME-INR
INR: 1.1 (ref 0.8–1.2)
Prothrombin Time: 14.7 s (ref 11.4–15.2)

## 2022-12-21 LAB — VITAMIN D 25 HYDROXY (VIT D DEFICIENCY, FRACTURES): Vit D, 25-Hydroxy: 97.52 ng/mL (ref 30–100)

## 2022-12-21 LAB — TYPE AND SCREEN
ABO/RH(D): O NEG
Antibody Screen: NEGATIVE

## 2022-12-21 LAB — BASIC METABOLIC PANEL
Anion gap: 14 (ref 5–15)
BUN: 14 mg/dL (ref 8–23)
CO2: 20 mmol/L — ABNORMAL LOW (ref 22–32)
Calcium: 8.8 mg/dL — ABNORMAL LOW (ref 8.9–10.3)
Chloride: 104 mmol/L (ref 98–111)
Creatinine, Ser: 1.41 mg/dL — ABNORMAL HIGH (ref 0.61–1.24)
GFR, Estimated: 50 mL/min — ABNORMAL LOW (ref >60)
Glucose, Bld: 186 mg/dL — ABNORMAL HIGH (ref 70–99)
Potassium: 2.9 mmol/L — ABNORMAL LOW (ref 3.5–5.1)
Sodium: 138 mmol/L (ref 135–145)

## 2022-12-21 LAB — MAGNESIUM
Magnesium: 1.7 mg/dL (ref 1.7–2.4)
Magnesium: 1.8 mg/dL (ref 1.7–2.4)

## 2022-12-21 MED ORDER — INSULIN ASPART 100 UNIT/ML IJ SOLN
0.0000 [IU] | INTRAMUSCULAR | Status: DC
  Administered 2022-12-21 (×2): 5 [IU] via SUBCUTANEOUS
  Administered 2022-12-21: 3 [IU] via SUBCUTANEOUS
  Administered 2022-12-22: 5 [IU] via SUBCUTANEOUS
  Administered 2022-12-22 (×2): 3 [IU] via SUBCUTANEOUS
  Administered 2022-12-22 – 2022-12-23 (×3): 5 [IU] via SUBCUTANEOUS
  Administered 2022-12-23 (×2): 8 [IU] via SUBCUTANEOUS
  Administered 2022-12-23: 3 [IU] via SUBCUTANEOUS
  Administered 2022-12-24: 5 [IU] via SUBCUTANEOUS
  Administered 2022-12-24: 3 [IU] via SUBCUTANEOUS
  Administered 2022-12-24 (×4): 5 [IU] via SUBCUTANEOUS
  Administered 2022-12-25: 3 [IU] via SUBCUTANEOUS
  Administered 2022-12-25 (×3): 5 [IU] via SUBCUTANEOUS
  Administered 2022-12-25: 3 [IU] via SUBCUTANEOUS
  Administered 2022-12-25 – 2022-12-26 (×2): 5 [IU] via SUBCUTANEOUS
  Administered 2022-12-26 (×2): 3 [IU] via SUBCUTANEOUS

## 2022-12-21 MED ORDER — FENTANYL CITRATE PF 50 MCG/ML IJ SOSY
50.0000 ug | PREFILLED_SYRINGE | INTRAMUSCULAR | Status: DC | PRN
  Administered 2022-12-21 – 2022-12-23 (×9): 50 ug via INTRAVENOUS
  Filled 2022-12-21 (×9): qty 1

## 2022-12-21 MED ORDER — MUPIROCIN 2 % EX OINT
1.0000 | TOPICAL_OINTMENT | Freq: Two times a day (BID) | CUTANEOUS | Status: DC
  Administered 2022-12-21 – 2022-12-24 (×4): 1 via NASAL
  Filled 2022-12-21 (×2): qty 22

## 2022-12-21 MED ORDER — MAGNESIUM SULFATE 2 GM/50ML IV SOLN
2.0000 g | Freq: Once | INTRAVENOUS | Status: AC
  Administered 2022-12-21: 2 g via INTRAVENOUS
  Filled 2022-12-21: qty 50

## 2022-12-21 MED ORDER — POTASSIUM CHLORIDE 10 MEQ/100ML IV SOLN
10.0000 meq | INTRAVENOUS | Status: AC
  Administered 2022-12-21 (×2): 10 meq via INTRAVENOUS
  Filled 2022-12-21 (×2): qty 100

## 2022-12-21 MED ORDER — INSULIN GLARGINE-YFGN 100 UNIT/ML ~~LOC~~ SOLN
5.0000 [IU] | Freq: Every day | SUBCUTANEOUS | Status: DC
  Administered 2022-12-21 – 2022-12-26 (×5): 5 [IU] via SUBCUTANEOUS
  Filled 2022-12-21 (×6): qty 0.05

## 2022-12-21 MED ORDER — GABAPENTIN 300 MG PO CAPS
600.0000 mg | ORAL_CAPSULE | Freq: Two times a day (BID) | ORAL | Status: DC
  Administered 2022-12-22 – 2022-12-26 (×8): 600 mg via ORAL
  Filled 2022-12-21 (×9): qty 2

## 2022-12-21 MED ORDER — FINASTERIDE 5 MG PO TABS
5.0000 mg | ORAL_TABLET | Freq: Every day | ORAL | Status: DC
  Administered 2022-12-22 – 2022-12-25 (×4): 5 mg via ORAL
  Filled 2022-12-21 (×4): qty 1

## 2022-12-21 MED ORDER — ROPIVACAINE HCL 5 MG/ML IJ SOLN
INTRAMUSCULAR | Status: DC | PRN
  Administered 2022-12-21: 30 mL via PERINEURAL

## 2022-12-21 MED ORDER — TAMSULOSIN HCL 0.4 MG PO CAPS
0.4000 mg | ORAL_CAPSULE | Freq: Every day | ORAL | Status: DC
  Administered 2022-12-21 – 2022-12-25 (×5): 0.4 mg via ORAL
  Filled 2022-12-21 (×5): qty 1

## 2022-12-21 MED ORDER — SODIUM CHLORIDE 0.9 % IV SOLN: 12.5000 mg | Freq: Four times a day (QID) | INTRAVENOUS | Status: DC | PRN

## 2022-12-21 MED ORDER — SULFAMETHOXAZOLE-TRIMETHOPRIM 800-160 MG PO TABS
1.0000 | ORAL_TABLET | Freq: Two times a day (BID) | ORAL | Status: DC
  Administered 2022-12-21 (×2): 1 via ORAL
  Filled 2022-12-21 (×2): qty 1

## 2022-12-21 MED ORDER — ONDANSETRON HCL 4 MG/2ML IJ SOLN
4.0000 mg | Freq: Once | INTRAMUSCULAR | Status: DC
  Filled 2022-12-21: qty 2

## 2022-12-21 NOTE — Consult Note (Signed)
Reason for Consult:Left hip fx Referring Physician: Osvaldo Shipper Time called: 0730 Time at bedside: 0908   Luke Edwards is an 82 y.o. male.  HPI: Dontrelle was handing some metal to someone on the roof when he lost his balance and fell down about 3 steps and into the yard. He had immediate left hip pain and could not get up. He was brought to the ED where x-rays showed a left hip fx and orthopedic surgery was consulted. He lives at home and does not use any assistive devices to ambulate.  Past Medical History:  Diagnosis Date   COVID-19    Diabetes mellitus without complication (HCC)    GERD (gastroesophageal reflux disease)    HLD (hyperlipidemia)    Hypertension     Past Surgical History:  Procedure Laterality Date   APPENDECTOMY     CHOLECYSTECTOMY      Family History  Problem Relation Age of Onset   Valvular heart disease Mother    Diabetes Mother    Heart attack Father        19's   Diabetes Daughter     Social History:  reports that he has never smoked. He has never used smokeless tobacco. He reports current alcohol use of about 2.0 standard drinks of alcohol per week. He reports that he does not use drugs.  Allergies: No Known Allergies  Medications: I have reviewed the patient's current medications.  Results for orders placed or performed during the hospital encounter of 12/20/22 (from the past 48 hour(s))  CBC with Differential     Status: Abnormal   Collection Time: 12/21/22 12:00 AM  Result Value Ref Range   WBC 15.4 (H) 4.0 - 10.5 K/uL   RBC 4.06 (L) 4.22 - 5.81 MIL/uL   Hemoglobin 12.9 (L) 13.0 - 17.0 g/dL   HCT 40.9 (L) 81.1 - 91.4 %   MCV 90.4 80.0 - 100.0 fL   MCH 31.8 26.0 - 34.0 pg   MCHC 35.1 30.0 - 36.0 g/dL   RDW 78.2 95.6 - 21.3 %   Platelets 230 150 - 400 K/uL   nRBC 0.0 0.0 - 0.2 %   Neutrophils Relative % 71 %   Neutro Abs 11.1 (H) 1.7 - 7.7 K/uL   Lymphocytes Relative 20 %   Lymphs Abs 3.1 0.7 - 4.0 K/uL   Monocytes Relative 7 %    Monocytes Absolute 1.0 0.1 - 1.0 K/uL   Eosinophils Relative 1 %   Eosinophils Absolute 0.1 0.0 - 0.5 K/uL   Basophils Relative 0 %   Basophils Absolute 0.0 0.0 - 0.1 K/uL   Immature Granulocytes 1 %   Abs Immature Granulocytes 0.07 0.00 - 0.07 K/uL    Comment: Performed at [facility], [address], [place], [place] [address]  Basic metabolic panel     Status: Abnormal   Collection Time: 12/21/22 12:00 AM  Result Value Ref Range   Sodium 138 135 - 145 mmol/L   Potassium 2.9 (L) 3.5 - 5.1 mmol/L   Chloride 104 98 - 111 mmol/L   CO2 20 (L) 22 - 32 mmol/L   Glucose, Bld 186 (H) 70 - 99 mg/dL    Comment: Glucose reference range applies only to samples taken after fasting for at least 8 hours.   BUN 14 8 - 23 mg/dL   Creatinine, Ser 8.46 (H) 0.61 - 1.24 mg/dL   Calcium 8.8 (L) 8.9 - 10.3 mg/dL   GFR, Estimated 50 (L) >60 mL/min  Comment: (NOTE) Calculated using the CKD-EPI Creatinine Equation (2021)    Anion gap 14 5 - 15    Comment: Performed at [facility], [address] [place], [place] [address]  Type and screen [facility]     Status: None   Collection Time: 12/21/22 12:00 AM  Result Value Ref Range   ABO/RH(D) O NEG    Antibody Screen NEG    Sample Expiration      12/24/2022,2359 Performed at [facility], [address], [place], [place] [address]   Magnesium     Status: None   Collection Time: 12/21/22 12:00 AM  Result Value Ref Range   Magnesium 1.7 1.7 - 2.4 mg/dL    Comment: Performed at [facility], [address], [place], [place] [address]  Glucose, capillary     Status: Abnormal   Collection Time: 12/21/22 12:47 AM  Result Value Ref Range   Glucose-Capillary 186 (H) 70 - 99 mg/dL    Comment: Glucose reference range applies only to samples taken after fasting for at least 8 hours.  CBC with Differential/Platelet     Status: Abnormal   Collection Time: 12/21/22  4:09 AM  Result Value Ref Range   WBC 12.6 (H)  4.0 - 10.5 K/uL   RBC 4.10 (L) 4.22 - 5.81 MIL/uL   Hemoglobin 12.7 (L) 13.0 - 17.0 g/dL   HCT 91.4 (L) 78.2 - 95.6 %   MCV 89.0 80.0 - 100.0 fL   MCH 31.0 26.0 - 34.0 pg   MCHC 34.8 30.0 - 36.0 g/dL   RDW 21.3 08.6 - 57.8 %   Platelets 212 150 - 400 K/uL   nRBC 0.0 0.0 - 0.2 %   Neutrophils Relative % 84 %   Neutro Abs 10.7 (H) 1.7 - 7.7 K/uL   Lymphocytes Relative 9 %   Lymphs Abs 1.1 0.7 - 4.0 K/uL   Monocytes Relative 6 %   Monocytes Absolute 0.7 0.1 - 1.0 K/uL   Eosinophils Relative 0 %   Eosinophils Absolute 0.0 0.0 - 0.5 K/uL   Basophils Relative 0 %   Basophils Absolute 0.0 0.0 - 0.1 K/uL   Immature Granulocytes 1 %   Abs Immature Granulocytes 0.06 0.00 - 0.07 K/uL    Comment: Performed at [facility], [address], [place], [place] [address]  Comprehensive metabolic panel     Status: Abnormal   Collection Time: 12/21/22  4:09 AM  Result Value Ref Range   Sodium 136 135 - 145 mmol/L   Potassium 3.4 (L) 3.5 - 5.1 mmol/L   Chloride 104 98 - 111 mmol/L   CO2 23 22 - 32 mmol/L   Glucose, Bld 293 (H) 70 - 99 mg/dL    Comment: Glucose reference range applies only to samples taken after fasting for at least 8 hours.   BUN 14 8 - 23 mg/dL   Creatinine, Ser 9.52 (H) 0.61 - 1.24 mg/dL   Calcium 8.3 (L) 8.9 - 10.3 mg/dL   Total Protein 6.5 6.5 - 8.1 g/dL   Albumin 3.7 3.5 - 5.0 g/dL   AST 16 15 - 41 U/L   ALT 17 0 - 44 U/L   Alkaline Phosphatase 54 38 - 126 U/L   Total Bilirubin 1.4 (H) 0.3 - 1.2 mg/dL   GFR, Estimated 52 (L) >60 mL/min    Comment: (NOTE) Calculated using the CKD-EPI Creatinine Equation (2021)    Anion gap 9 5 -  15    Comment: Performed at [facility], [address] [place], [place] [address]  Magnesium     Status: None   Collection Time: 12/21/22  4:09 AM  Result Value Ref Range   Magnesium 1.8 1.7 - 2.4 mg/dL    Comment: Performed at [facility], [address], [place], [place] [address]  Protime-INR     Status: None    Collection Time: 12/21/22  4:09 AM  Result Value Ref Range   Prothrombin Time 14.7 11.4 - 15.2 seconds   INR 1.1 0.8 - 1.2    Comment: (NOTE) INR goal varies based on device and disease states. Performed at [facility], [address] [place], [place] [address]   Glucose, capillary     Status: Abnormal   Collection Time: 12/21/22  5:50 AM  Result Value Ref Range   Glucose-Capillary 294 (H) 70 - 99 mg/dL    Comment: Glucose reference range applies only to samples taken after fasting for at least 8 hours.  Glucose, capillary     Status: Abnormal   Collection Time: 12/21/22  8:13 AM  Result Value Ref Range   Glucose-Capillary 269 (H) 70 - 99 mg/dL    Comment: Glucose reference range applies only to samples taken after fasting for at least 8 hours.    DG Knee 2 Views Left  Result Date: 12/20/2022 CLINICAL DATA:  Fall, hip fracture EXAM: LEFT KNEE - 1-2 VIEW COMPARISON:  None Available. FINDINGS: No fracture or dislocation is seen. Moderate tricompartmental degenerative changes, prominent in the medial compartment. Visualized soft tissues are within normal limits. No suprapatellar knee joint effusion. IMPRESSION: No fracture or dislocation is seen. Moderate degenerative changes. Electronically Signed   By: Charline Bills M.D.   On: 12/20/2022 23:42   CT Head Wo Contrast  Result Date: 12/20/2022 CLINICAL DATA:  Head trauma, minor (Age >= 65y); Neck trauma (Age >= 65y), fall from roof EXAM: CT HEAD WITHOUT CONTRAST CT CERVICAL SPINE WITHOUT CONTRAST TECHNIQUE: Multidetector CT imaging of the head and cervical spine was performed following the standard protocol without intravenous contrast. Multiplanar CT image reconstructions of the cervical spine were also generated. RADIATION DOSE REDUCTION: This exam was performed according to the departmental dose-optimization program which includes automated exposure control, adjustment of the mA and/or kV according to patient size and/or use of  iterative reconstruction technique. COMPARISON:  None Available. FINDINGS: CT HEAD FINDINGS Brain: Normal anatomic configuration. Parenchymal volume loss is commensurate with the patient's age. Moderate periventricular white matter changes are present likely reflecting the sequela of small vessel ischemia. Remote left thalamic infarct again noted. No abnormal intra or extra-axial mass lesion or fluid collection. No abnormal mass effect or midline shift. No evidence of acute intracranial hemorrhage or infarct. Ventricular size is normal. Cerebellum unremarkable. Vascular: No asymmetric hyperdense vasculature at the skull base. Skull: Intact Sinuses/Orbits: Right maxillary antrectomy has been performed. Mucous retention cyst noted within the visualized left maxillary sinus. Otherwise, paranasal sinuses are clear. Orbits are unremarkable. Other: Mastoid air cells and middle ear cavities are clear. CT CERVICAL SPINE FINDINGS Alignment: Normal. Skull base and vertebrae: No acute fracture. No primary bone lesion or focal pathologic process. Soft tissues and spinal canal: No prevertebral fluid or swelling. No visible canal hematoma. Posterior disc osteophyte complex at C4-5 and posterior disc bulge at C3-4 in combination with laminar hypertrophy and mild shortening of the pedicles results in mild central canal stenosis with flattening of the thecal  sac. Spinal canal is otherwise widely patent. Disc levels: There is intervertebral disc space narrowing and endplate remodeling of C4-C7 in keeping with changes of mild to moderate degenerative disc disease, most severe at C4-5. Prevertebral soft tissues are not thickened on sagittal reformats. Multilevel uncovertebral and facet arthrosis results in multilevel moderate to severe neuroforaminal narrowing, most severe bilaterally at C4-5, on the left at C5-6, and bilaterally at C6-7. Upper chest: Negative. Other: None IMPRESSION: 1. No acute intracranial abnormality. No calvarial  fracture. 2. Stable senescent change and remote left thalamic infarct. 3. No acute fracture or listhesis of the cervical spine. 4. Multilevel degenerative disc and degenerative joint disease resulting in multilevel moderate to severe neuroforaminal narrowing as described above. Electronically Signed   By: Helyn Numbers M.D.   On: 12/20/2022 21:57   CT Cervical Spine Wo Contrast  Result Date: 12/20/2022 CLINICAL DATA:  Head trauma, minor (Age >= 65y); Neck trauma (Age >= 65y), fall from roof EXAM: CT HEAD WITHOUT CONTRAST CT CERVICAL SPINE WITHOUT CONTRAST TECHNIQUE: Multidetector CT imaging of the head and cervical spine was performed following the standard protocol without intravenous contrast. Multiplanar CT image reconstructions of the cervical spine were also generated. RADIATION DOSE REDUCTION: This exam was performed according to the departmental dose-optimization program which includes automated exposure control, adjustment of the mA and/or kV according to patient size and/or use of iterative reconstruction technique. COMPARISON:  None Available. FINDINGS: CT HEAD FINDINGS Brain: Normal anatomic configuration. Parenchymal volume loss is commensurate with the patient's age. Moderate periventricular white matter changes are present likely reflecting the sequela of small vessel ischemia. Remote left thalamic infarct again noted. No abnormal intra or extra-axial mass lesion or fluid collection. No abnormal mass effect or midline shift. No evidence of acute intracranial hemorrhage or infarct. Ventricular size is normal. Cerebellum unremarkable. Vascular: No asymmetric hyperdense vasculature at the skull base. Skull: Intact Sinuses/Orbits: Right maxillary antrectomy has been performed. Mucous retention cyst noted within the visualized left maxillary sinus. Otherwise, paranasal sinuses are clear. Orbits are unremarkable. Other: Mastoid air cells and middle ear cavities are clear. CT CERVICAL SPINE FINDINGS  Alignment: Normal. Skull base and vertebrae: No acute fracture. No primary bone lesion or focal pathologic process. Soft tissues and spinal canal: No prevertebral fluid or swelling. No visible canal hematoma. Posterior disc osteophyte complex at C4-5 and posterior disc bulge at C3-4 in combination with laminar hypertrophy and mild shortening of the pedicles results in mild central canal stenosis with flattening of the thecal sac. Spinal canal is otherwise widely patent. Disc levels: There is intervertebral disc space narrowing and endplate remodeling of C4-C7 in keeping with changes of mild to moderate degenerative disc disease, most severe at C4-5. Prevertebral soft tissues are not thickened on sagittal reformats. Multilevel uncovertebral and facet arthrosis results in multilevel moderate to severe neuroforaminal narrowing, most severe bilaterally at C4-5, on the left at C5-6, and bilaterally at C6-7. Upper chest: Negative. Other: None IMPRESSION: 1. No acute intracranial abnormality. No calvarial fracture. 2. Stable senescent change and remote left thalamic infarct. 3. No acute fracture or listhesis of the cervical spine. 4. Multilevel degenerative disc and degenerative joint disease resulting in multilevel moderate to severe neuroforaminal narrowing as described above. Electronically Signed   By: Helyn Numbers M.D.   On: 12/20/2022 21:57   CT Hip Left Wo Contrast  Result Date: 12/20/2022 CLINICAL DATA:  Left hip trauma and pain.  Concern for fracture. EXAM: CT OF THE LEFT HIP WITHOUT CONTRAST TECHNIQUE: Multidetector CT  imaging of the left hip was performed according to the standard protocol. Multiplanar CT image reconstructions were also generated. RADIATION DOSE REDUCTION: This exam was performed according to the departmental dose-optimization program which includes automated exposure control, adjustment of the mA and/or kV according to patient size and/or use of iterative reconstruction technique.  COMPARISON:  None Available. FINDINGS: Bones/Joint/Cartilage There is a minimally displaced and impacted fracture of the left femoral neck. The bones are mildly osteopenic. There is no dislocation. No joint effusion. Ligaments Suboptimally assessed by CT. Muscles and Tendons No intramuscular fluid collection or hematoma. Soft tissues Sigmoid diverticulosis. IMPRESSION: Minimally displaced and impacted fracture of the left femoral neck. Electronically Signed   By: Elgie Collard M.D.   On: 12/20/2022 21:55   DG Chest Portable 1 View  Result Date: 12/20/2022 CLINICAL DATA:  Fall EXAM: PORTABLE CHEST 1 VIEW COMPARISON:  None Available. FINDINGS: Mild bronchitic changes are seen centrally, new since prior examination. No confluent pulmonary infiltrate. No pneumothorax or pleural effusion. Cardiac size within normal limits. No acute bone abnormality. IMPRESSION: 1. Mild bronchitic changes. Electronically Signed   By: Helyn Numbers M.D.   On: 12/20/2022 20:36    Review of Systems  HENT:  Negative for ear discharge, ear pain, hearing loss and tinnitus.   Eyes:  Negative for photophobia and pain.  Respiratory:  Negative for cough and shortness of breath.   Cardiovascular:  Negative for chest pain.  Gastrointestinal:  Negative for abdominal pain, nausea and vomiting.  Genitourinary:  Negative for dysuria, flank pain, frequency and urgency.  Musculoskeletal:  Positive for arthralgias (Left hip). Negative for back pain, myalgias and neck pain.  Neurological:  Negative for dizziness and headaches.  Hematological:  Does not bruise/bleed easily.  Psychiatric/Behavioral:  The patient is not nervous/anxious.    Blood pressure 132/68, pulse 82, temperature 98.6 F (37 C), temperature source Oral, resp. rate 18, SpO2 92%. Physical Exam Constitutional:      General: He is not in acute distress.    Appearance: He is well-developed. He is not diaphoretic.  HENT:     Head: Normocephalic and atraumatic.  Eyes:      General: No scleral icterus.       Right eye: No discharge.        Left eye: No discharge.     Conjunctiva/sclera: Conjunctivae normal.  Cardiovascular:     Rate and Rhythm: Normal rate and regular rhythm.  Pulmonary:     Effort: Pulmonary effort is normal. No respiratory distress.  Musculoskeletal:     Cervical back: Normal range of motion.     Comments: LLE No traumatic wounds, ecchymosis, or rash  Mild TTP hip  No knee or ankle effusion  Knee stable to varus/ valgus and anterior/posterior stress  Sens DPN, SPN, TN intact  Motor EHL, ext, flex, evers 5/5  DP 2+, PT 2+, No significant edema  Skin:    General: Skin is warm and dry.  Neurological:     Mental Status: He is alert.  Psychiatric:        Mood and Affect: Mood normal.        Behavior: Behavior normal.    Assessment/Plan: Left hip fx -- Will need THA, timing and surgeon TBD. Will allow to eat today.    Freeman Caldron, PA-C Orthopedic Surgery [phone] 12/21/2022, 9:34 AM

## 2022-12-21 NOTE — Inpatient Diabetes Management (Signed)
Inpatient Diabetes Program Recommendations  AACE/ADA: New Consensus Statement on Inpatient Glycemic Control (2015)  Target Ranges:  Prepandial:   less than 140 mg/dL      Peak postprandial:   less than 180 mg/dL (1-2 hours)      Critically ill patients:  140 - 180 mg/dL   Lab Results  Component Value Date   GLUCAP 218 (H) 12/21/2022   HGBA1C 9.0 (H) 04/29/2019    Review of Glycemic Control  Latest Reference Range & Units 12/21/22 00:47 12/21/22 05:50 12/21/22 08:13 12/21/22 11:39  Glucose-Capillary 70 - 99 mg/dL 161 (H) 096 (H) 045 (H) 218 (H)   Diabetes history: DM 2 Outpatient Diabetes medications:  Glucotrol 10 mg daily Lantus 65 units daily Novolin R 6 units tid with meals Current orders for Inpatient glycemic control:  Novolog 0-15 units q 4 hours Semglee 5 units daily  Inpatient Diabetes Program Recommendations:    Consider increasing Semglee to 25 units daily.    Thanks,  Beryl Meager, RN, BC-ADM Inpatient Diabetes Coordinator Pager [phone]  (8a-5p)

## 2022-12-21 NOTE — Progress Notes (Addendum)
[facility] PROGRESS NOTE   Markuz Sima Haigler HQI:[id number] DOB: [birth year] DOA: 12/20/2022  PCP: Galvin Proffer, MD  Brief History/Interval Summary: 82 year old male with past medical history of diabetes mellitus type 2, essential hypertension presented after sustaining a mechanical fall at home.  Was found to have left hip fracture.  Was hospitalized for further management.  Consultants: Orthopedics  Procedures: None yet    Subjective/Interval History: Denies any chest pain shortness of breath.  No nausea vomiting.  Pain is reasonably well-controlled.   Assessment/Plan:  Left hip fracture Orthopedics consulted.  Possible surgery later today. Please review H&P for risk assessment. Pain is reasonably well-controlled.  Diabetes mellitus type 2 On basal insulin at home.  Monitor CBGs.  Currently just on SSI.  Essential hypertension Holding lisinopril and HCTZ for now.  Monitor blood pressures.  Hypokalemia Supplemented.  Improvement in levels noted.  Magnesium 1.8.  Normocytic anemia Monitor hemoglobin closely.  History of BPH Continue with tamsulosin and Proscar.  DVT Prophylaxis: Definitive prophylaxis after surgery Code Status: Full code Family Communication: Discussed with patient and his with daughter Disposition Plan: To be determined  Status is: Inpatient Remains inpatient appropriate because: Left hip fracture requiring surgical intervention      Medications: Scheduled:  insulin aspart  0-9 Units Subcutaneous Q6H   ondansetron (ZOFRAN) IV  4 mg Intravenous Once   Continuous:  promethazine (PHENERGAN) injection (IM or IVPB)     XLK:GMWNUUVOZDGUY **OR** acetaminophen, fentaNYL (SUBLIMAZE) injection, melatonin, naLOXone (NARCAN)  injection, ondansetron (ZOFRAN) IV, promethazine (PHENERGAN) injection (IM or IVPB)  Antibiotics: Anti-infectives (From admission, onward)    None       Objective:  Vital Signs  Vitals:   12/21/22 0021  12/21/22 0042 12/21/22 0042 12/21/22 0736  BP: (!) 166/87 (!) 153/88 (!) 153/88 132/68  Pulse: 96 96 96 82  Resp: 15 18  18   Temp: (!) 97.4 F (36.3 C) 98.6 F (37 C) 98.6 F (37 C) 98.6 F (37 C)  TempSrc: Temporal Oral Oral Oral  SpO2: 94% 95% 95% 92%    Intake/Output Summary (Last 24 hours) at 12/21/2022 1012 Last data filed at 12/21/2022 0900 Gross per 24 hour  Intake 0 ml  Output 120 ml  Net -120 ml   There were no vitals filed for this visit.  General appearance: Awake alert.  In no distress Resp: Clear to auscultation bilaterally.  Normal effort Cardio: S1-S2 is normal regular.  No S3-S4.  No rubs murmurs or bruit GI: Abdomen is soft.  Nontender nondistended.  Bowel sounds are present normal.  No masses organomegaly   Lab Results:  Data Reviewed: I have personally reviewed following labs and reports of the imaging studies  CBC: Recent Labs  Lab 12/21/22 0000 12/21/22 0409  WBC 15.4* 12.6*  NEUTROABS 11.1* 10.7*  HGB 12.9* 12.7*  HCT 36.7* 36.5*  MCV 90.4 89.0  PLT 230 212    Basic Metabolic Panel: Recent Labs  Lab 12/21/22 0000 12/21/22 0409  NA 138 136  K 2.9* 3.4*  CL 104 104  CO2 20* 23  GLUCOSE 186* 293*  BUN 14 14  CREATININE 1.41* 1.36*  CALCIUM 8.8* 8.3*  MG 1.7 1.8    GFR: CrCl cannot be calculated (Unknown ideal weight.).  Liver Function Tests: Recent Labs  Lab 12/21/22 0409  AST 16  ALT 17  ALKPHOS 54  BILITOT 1.4*  PROT 6.5  ALBUMIN 3.7     Coagulation Profile: Recent Labs  Lab 12/21/22 0409  INR 1.1  CBG: Recent Labs  Lab 12/21/22 0047 12/21/22 0550 12/21/22 0813  GLUCAP 186* 294* 269*     Radiology Studies: DG Knee 2 Views Left  Result Date: 12/20/2022 CLINICAL DATA:  Fall, hip fracture EXAM: LEFT KNEE - 1-2 VIEW COMPARISON:  None Available. FINDINGS: No fracture or dislocation is seen. Moderate tricompartmental degenerative changes, prominent in the medial compartment. Visualized soft tissues are  within normal limits. No suprapatellar knee joint effusion. IMPRESSION: No fracture or dislocation is seen. Moderate degenerative changes. Electronically Signed   By: Charline Bills M.D.   On: 12/20/2022 23:42   CT Head Wo Contrast  Result Date: 12/20/2022 CLINICAL DATA:  Head trauma, minor (Age >= 65y); Neck trauma (Age >= 65y), fall from roof EXAM: CT HEAD WITHOUT CONTRAST CT CERVICAL SPINE WITHOUT CONTRAST TECHNIQUE: Multidetector CT imaging of the head and cervical spine was performed following the standard protocol without intravenous contrast. Multiplanar CT image reconstructions of the cervical spine were also generated. RADIATION DOSE REDUCTION: This exam was performed according to the departmental dose-optimization program which includes automated exposure control, adjustment of the mA and/or kV according to patient size and/or use of iterative reconstruction technique. COMPARISON:  None Available. FINDINGS: CT HEAD FINDINGS Brain: Normal anatomic configuration. Parenchymal volume loss is commensurate with the patient's age. Moderate periventricular white matter changes are present likely reflecting the sequela of small vessel ischemia. Remote left thalamic infarct again noted. No abnormal intra or extra-axial mass lesion or fluid collection. No abnormal mass effect or midline shift. No evidence of acute intracranial hemorrhage or infarct. Ventricular size is normal. Cerebellum unremarkable. Vascular: No asymmetric hyperdense vasculature at the skull base. Skull: Intact Sinuses/Orbits: Right maxillary antrectomy has been performed. Mucous retention cyst noted within the visualized left maxillary sinus. Otherwise, paranasal sinuses are clear. Orbits are unremarkable. Other: Mastoid air cells and middle ear cavities are clear. CT CERVICAL SPINE FINDINGS Alignment: Normal. Skull base and vertebrae: No acute fracture. No primary bone lesion or focal pathologic process. Soft tissues and spinal canal: No  prevertebral fluid or swelling. No visible canal hematoma. Posterior disc osteophyte complex at C4-5 and posterior disc bulge at C3-4 in combination with laminar hypertrophy and mild shortening of the pedicles results in mild central canal stenosis with flattening of the thecal sac. Spinal canal is otherwise widely patent. Disc levels: There is intervertebral disc space narrowing and endplate remodeling of C4-C7 in keeping with changes of mild to moderate degenerative disc disease, most severe at C4-5. Prevertebral soft tissues are not thickened on sagittal reformats. Multilevel uncovertebral and facet arthrosis results in multilevel moderate to severe neuroforaminal narrowing, most severe bilaterally at C4-5, on the left at C5-6, and bilaterally at C6-7. Upper chest: Negative. Other: None IMPRESSION: 1. No acute intracranial abnormality. No calvarial fracture. 2. Stable senescent change and remote left thalamic infarct. 3. No acute fracture or listhesis of the cervical spine. 4. Multilevel degenerative disc and degenerative joint disease resulting in multilevel moderate to severe neuroforaminal narrowing as described above. Electronically Signed   By: Helyn Numbers M.D.   On: 12/20/2022 21:57   CT Cervical Spine Wo Contrast  Result Date: 12/20/2022 CLINICAL DATA:  Head trauma, minor (Age >= 65y); Neck trauma (Age >= 65y), fall from roof EXAM: CT HEAD WITHOUT CONTRAST CT CERVICAL SPINE WITHOUT CONTRAST TECHNIQUE: Multidetector CT imaging of the head and cervical spine was performed following the standard protocol without intravenous contrast. Multiplanar CT image reconstructions of the cervical spine were also generated. RADIATION DOSE REDUCTION: This exam  was performed according to the departmental dose-optimization program which includes automated exposure control, adjustment of the mA and/or kV according to patient size and/or use of iterative reconstruction technique. COMPARISON:  None Available. FINDINGS:  CT HEAD FINDINGS Brain: Normal anatomic configuration. Parenchymal volume loss is commensurate with the patient's age. Moderate periventricular white matter changes are present likely reflecting the sequela of small vessel ischemia. Remote left thalamic infarct again noted. No abnormal intra or extra-axial mass lesion or fluid collection. No abnormal mass effect or midline shift. No evidence of acute intracranial hemorrhage or infarct. Ventricular size is normal. Cerebellum unremarkable. Vascular: No asymmetric hyperdense vasculature at the skull base. Skull: Intact Sinuses/Orbits: Right maxillary antrectomy has been performed. Mucous retention cyst noted within the visualized left maxillary sinus. Otherwise, paranasal sinuses are clear. Orbits are unremarkable. Other: Mastoid air cells and middle ear cavities are clear. CT CERVICAL SPINE FINDINGS Alignment: Normal. Skull base and vertebrae: No acute fracture. No primary bone lesion or focal pathologic process. Soft tissues and spinal canal: No prevertebral fluid or swelling. No visible canal hematoma. Posterior disc osteophyte complex at C4-5 and posterior disc bulge at C3-4 in combination with laminar hypertrophy and mild shortening of the pedicles results in mild central canal stenosis with flattening of the thecal sac. Spinal canal is otherwise widely patent. Disc levels: There is intervertebral disc space narrowing and endplate remodeling of C4-C7 in keeping with changes of mild to moderate degenerative disc disease, most severe at C4-5. Prevertebral soft tissues are not thickened on sagittal reformats. Multilevel uncovertebral and facet arthrosis results in multilevel moderate to severe neuroforaminal narrowing, most severe bilaterally at C4-5, on the left at C5-6, and bilaterally at C6-7. Upper chest: Negative. Other: None IMPRESSION: 1. No acute intracranial abnormality. No calvarial fracture. 2. Stable senescent change and remote left thalamic infarct. 3.  No acute fracture or listhesis of the cervical spine. 4. Multilevel degenerative disc and degenerative joint disease resulting in multilevel moderate to severe neuroforaminal narrowing as described above. Electronically Signed   By: Helyn Numbers M.D.   On: 12/20/2022 21:57   CT Hip Left Wo Contrast  Result Date: 12/20/2022 CLINICAL DATA:  Left hip trauma and pain.  Concern for fracture. EXAM: CT OF THE LEFT HIP WITHOUT CONTRAST TECHNIQUE: Multidetector CT imaging of the left hip was performed according to the standard protocol. Multiplanar CT image reconstructions were also generated. RADIATION DOSE REDUCTION: This exam was performed according to the departmental dose-optimization program which includes automated exposure control, adjustment of the mA and/or kV according to patient size and/or use of iterative reconstruction technique. COMPARISON:  None Available. FINDINGS: Bones/Joint/Cartilage There is a minimally displaced and impacted fracture of the left femoral neck. The bones are mildly osteopenic. There is no dislocation. No joint effusion. Ligaments Suboptimally assessed by CT. Muscles and Tendons No intramuscular fluid collection or hematoma. Soft tissues Sigmoid diverticulosis. IMPRESSION: Minimally displaced and impacted fracture of the left femoral neck. Electronically Signed   By: Elgie Collard M.D.   On: 12/20/2022 21:55   DG Chest Portable 1 View  Result Date: 12/20/2022 CLINICAL DATA:  Fall EXAM: PORTABLE CHEST 1 VIEW COMPARISON:  None Available. FINDINGS: Mild bronchitic changes are seen centrally, new since prior examination. No confluent pulmonary infiltrate. No pneumothorax or pleural effusion. Cardiac size within normal limits. No acute bone abnormality. IMPRESSION: 1. Mild bronchitic changes. Electronically Signed   By: Helyn Numbers M.D.   On: 12/20/2022 20:36       LOS: 1 day  Carlyne Keehan [employer]  [facility] [employer] on [email or web address]  12/21/2022, 10:12 AM

## 2022-12-21 NOTE — Anesthesia Procedure Notes (Signed)
Anesthesia Regional Block: Femoral nerve block   Pre-Anesthetic Checklist: , timeout performed,  Correct Patient, Correct Site, Correct Laterality,  Correct Procedure, Correct Position, site marked,  Risks and benefits discussed,  Surgical consent,  Pre-op evaluation,  At surgeon's request and post-op pain management  Laterality: Left  Prep: chloraprep       Needles:  Injection technique: Single-shot  Needle Type: Stimiplex     Needle Length: 9cm  Needle Gauge: 21     Additional Needles:   Narrative:  Start time: 12/21/2022 3:35 PM End time: 12/21/2022 3:46 PM Injection made incrementally with aspirations every 5 mL.  Performed by: Personally  Anesthesiologist: Lowella Curb, MD

## 2022-12-21 NOTE — Anesthesia Postprocedure Evaluation (Signed)
Anesthesia Post Note  Patient: Luke Edwards  Procedure(s) Performed: AN AD HOC NERVE BLOCK     Patient location during evaluation: PACU Anesthesia Type: General Level of consciousness: awake and alert Pain management: pain level controlled Vital Signs Assessment: post-procedure vital signs reviewed and stable Respiratory status: spontaneous breathing, nonlabored ventilation and respiratory function stable Cardiovascular status: blood pressure returned to baseline and stable Postop Assessment: no apparent nausea or vomiting Anesthetic complications: no   No notable events documented.  Last Vitals:  Vitals:   12/21/22 1520 12/21/22 1535  BP: (!) 159/85 (!) 155/80  Pulse: 84 87  Resp: 14 18  Temp: 36.9 C   SpO2: 96% 97%    Last Pain:  Vitals:   12/21/22 1520  TempSrc:   PainSc: 8                  Lowella Curb

## 2022-12-21 NOTE — Anesthesia Preprocedure Evaluation (Signed)
Anesthesia Evaluation  Patient identified by MRN, date of birth, ID band Patient awake    Reviewed: Allergy & Precautions, H&P , NPO status , Patient's Chart, lab work & pertinent test results  Airway Mallampati: II  TM Distance: >3 FB Neck ROM: Full    Dental no notable dental hx.    Pulmonary neg pulmonary ROS   Pulmonary exam normal breath sounds clear to auscultation       Cardiovascular hypertension, Pt. on medications negative cardio ROS Normal cardiovascular exam Rhythm:Regular Rate:Normal     Neuro/Psych negative neurological ROS  negative psych ROS   GI/Hepatic Neg liver ROS,GERD  ,,  Endo/Other  negative endocrine ROSdiabetes, Type 2    Renal/GU negative Renal ROS  negative genitourinary   Musculoskeletal negative musculoskeletal ROS (+)    Abdominal   Peds negative pediatric ROS (+)  Hematology  (+) Blood dyscrasia, anemia   Anesthesia Other Findings   Reproductive/Obstetrics negative OB ROS                             Anesthesia Physical Anesthesia Plan  ASA: 3  Anesthesia Plan:    Post-op Pain Management: Regional block*   Induction: Intravenous  PONV Risk Score and Plan:   Airway Management Planned:   Additional Equipment:   Intra-op Plan:   Post-operative Plan:   Informed Consent: I have reviewed the patients History and Physical, chart, labs and discussed the procedure including the risks, benefits and alternatives for the proposed anesthesia with the patient or authorized representative who has indicated his/her understanding and acceptance.       Plan Discussed with:   Anesthesia Plan Comments:        Anesthesia Quick Evaluation

## 2022-12-21 NOTE — H&P (View-Only) (Signed)
Reason for Consult:Left hip fx Referring Physician: Osvaldo Shipper Time called: 0730 Time at bedside: 0908   Luke Edwards Rought is an 82 y.o. male.  HPI: Clell was handing some metal to someone on the roof when he lost his balance and fell down about 3 steps and into the yard. He had immediate left hip pain and could not get up. He was brought to the ED where x-rays showed a left hip fx and orthopedic surgery was consulted. He lives at home and does not use any assistive devices to ambulate.  Past Medical History:  Diagnosis Date   COVID-19    Diabetes mellitus without complication (HCC)    GERD (gastroesophageal reflux disease)    HLD (hyperlipidemia)    Hypertension     Past Surgical History:  Procedure Laterality Date   APPENDECTOMY     CHOLECYSTECTOMY      Family History  Problem Relation Age of Onset   Valvular heart disease Mother    Diabetes Mother    Heart attack Father        28's   Diabetes Daughter     Social History:  reports that he has never smoked. He has never used smokeless tobacco. He reports current alcohol use of about 2.0 standard drinks of alcohol per week. He reports that he does not use drugs.  Allergies: No Known Allergies  Medications: I have reviewed the patient's current medications.  Results for orders placed or performed during the hospital encounter of 12/20/22 (from the past 48 hour(s))  CBC with Differential     Status: Abnormal   Collection Time: 12/21/22 12:00 AM  Result Value Ref Range   WBC 15.4 (H) 4.0 - 10.5 K/uL   RBC 4.06 (L) 4.22 - 5.81 MIL/uL   Hemoglobin 12.9 (L) 13.0 - 17.0 g/dL   HCT 13.0 (L) 86.5 - 78.4 %   MCV 90.4 80.0 - 100.0 fL   MCH 31.8 26.0 - 34.0 pg   MCHC 35.1 30.0 - 36.0 g/dL   RDW 69.6 29.5 - 28.4 %   Platelets 230 150 - 400 K/uL   nRBC 0.0 0.0 - 0.2 %   Neutrophils Relative % 71 %   Neutro Abs 11.1 (H) 1.7 - 7.7 K/uL   Lymphocytes Relative 20 %   Lymphs Abs 3.1 0.7 - 4.0 K/uL   Monocytes Relative 7 %    Monocytes Absolute 1.0 0.1 - 1.0 K/uL   Eosinophils Relative 1 %   Eosinophils Absolute 0.1 0.0 - 0.5 K/uL   Basophils Relative 0 %   Basophils Absolute 0.0 0.0 - 0.1 K/uL   Immature Granulocytes 1 %   Abs Immature Granulocytes 0.07 0.00 - 0.07 K/uL    Comment: Performed at [facility], [address], [place], [place] [address]  Basic metabolic panel     Status: Abnormal   Collection Time: 12/21/22 12:00 AM  Result Value Ref Range   Sodium 138 135 - 145 mmol/L   Potassium 2.9 (L) 3.5 - 5.1 mmol/L   Chloride 104 98 - 111 mmol/L   CO2 20 (L) 22 - 32 mmol/L   Glucose, Bld 186 (H) 70 - 99 mg/dL    Comment: Glucose reference range applies only to samples taken after fasting for at least 8 hours.   BUN 14 8 - 23 mg/dL   Creatinine, Ser 0.10 (H) 0.61 - 1.24 mg/dL   Calcium 8.8 (L) 8.9 - 10.3 mg/dL   GFR, Estimated 50 (L) >60 mL/min  Comment: (NOTE) Calculated using the CKD-EPI Creatinine Equation (2021)    Anion gap 14 5 - 15    Comment: Performed at [facility], [address] [place], [place] [address]  Type and screen [facility]     Status: None   Collection Time: 12/21/22 12:00 AM  Result Value Ref Range   ABO/RH(D) O NEG    Antibody Screen NEG    Sample Expiration      12/24/2022,2359 Performed at [facility], [address] [place], [place] [address]   Magnesium     Status: None   Collection Time: 12/21/22 12:00 AM  Result Value Ref Range   Magnesium 1.7 1.7 - 2.4 mg/dL    Comment: Performed at [facility], [address] [place], [place] [address]  Glucose, capillary     Status: Abnormal   Collection Time: 12/21/22 12:47 AM  Result Value Ref Range   Glucose-Capillary 186 (H) 70 - 99 mg/dL    Comment: Glucose reference range applies only to samples taken after fasting for at least 8 hours.  CBC with Differential/Platelet     Status: Abnormal   Collection Time: 12/21/22  4:09 AM  Result Value Ref Range   WBC 12.6 (H)  4.0 - 10.5 K/uL   RBC 4.10 (L) 4.22 - 5.81 MIL/uL   Hemoglobin 12.7 (L) 13.0 - 17.0 g/dL   HCT 01.0 (L) 27.2 - 53.6 %   MCV 89.0 80.0 - 100.0 fL   MCH 31.0 26.0 - 34.0 pg   MCHC 34.8 30.0 - 36.0 g/dL   RDW 64.4 03.4 - 74.2 %   Platelets 212 150 - 400 K/uL   nRBC 0.0 0.0 - 0.2 %   Neutrophils Relative % 84 %   Neutro Abs 10.7 (H) 1.7 - 7.7 K/uL   Lymphocytes Relative 9 %   Lymphs Abs 1.1 0.7 - 4.0 K/uL   Monocytes Relative 6 %   Monocytes Absolute 0.7 0.1 - 1.0 K/uL   Eosinophils Relative 0 %   Eosinophils Absolute 0.0 0.0 - 0.5 K/uL   Basophils Relative 0 %   Basophils Absolute 0.0 0.0 - 0.1 K/uL   Immature Granulocytes 1 %   Abs Immature Granulocytes 0.06 0.00 - 0.07 K/uL    Comment: Performed at [facility], [address], [place], [place] [address]  Comprehensive metabolic panel     Status: Abnormal   Collection Time: 12/21/22  4:09 AM  Result Value Ref Range   Sodium 136 135 - 145 mmol/L   Potassium 3.4 (L) 3.5 - 5.1 mmol/L   Chloride 104 98 - 111 mmol/L   CO2 23 22 - 32 mmol/L   Glucose, Bld 293 (H) 70 - 99 mg/dL    Comment: Glucose reference range applies only to samples taken after fasting for at least 8 hours.   BUN 14 8 - 23 mg/dL   Creatinine, Ser 8.75 (H) 0.61 - 1.24 mg/dL   Calcium 8.3 (L) 8.9 - 10.3 mg/dL   Total Protein 6.5 6.5 - 8.1 g/dL   Albumin 3.7 3.5 - 5.0 g/dL   AST 16 15 - 41 U/L   ALT 17 0 - 44 U/L   Alkaline Phosphatase 54 38 - 126 U/L   Total Bilirubin 1.4 (H) 0.3 - 1.2 mg/dL   GFR, Estimated 52 (L) >60 mL/min    Comment: (NOTE) Calculated using the CKD-EPI Creatinine Equation (2021)    Anion gap 9 5 -  15    Comment: Performed at [facility], [address] [place], [place] [address]  Magnesium     Status: None   Collection Time: 12/21/22  4:09 AM  Result Value Ref Range   Magnesium 1.8 1.7 - 2.4 mg/dL    Comment: Performed at [facility], [address], [place], [place] [address]  Protime-INR     Status: None    Collection Time: 12/21/22  4:09 AM  Result Value Ref Range   Prothrombin Time 14.7 11.4 - 15.2 seconds   INR 1.1 0.8 - 1.2    Comment: (NOTE) INR goal varies based on device and disease states. Performed at [facility], [address] [place], [place] [address]   Glucose, capillary     Status: Abnormal   Collection Time: 12/21/22  5:50 AM  Result Value Ref Range   Glucose-Capillary 294 (H) 70 - 99 mg/dL    Comment: Glucose reference range applies only to samples taken after fasting for at least 8 hours.  Glucose, capillary     Status: Abnormal   Collection Time: 12/21/22  8:13 AM  Result Value Ref Range   Glucose-Capillary 269 (H) 70 - 99 mg/dL    Comment: Glucose reference range applies only to samples taken after fasting for at least 8 hours.    DG Knee 2 Views Left  Result Date: 12/20/2022 CLINICAL DATA:  Fall, hip fracture EXAM: LEFT KNEE - 1-2 VIEW COMPARISON:  None Available. FINDINGS: No fracture or dislocation is seen. Moderate tricompartmental degenerative changes, prominent in the medial compartment. Visualized soft tissues are within normal limits. No suprapatellar knee joint effusion. IMPRESSION: No fracture or dislocation is seen. Moderate degenerative changes. Electronically Signed   By: Charline Bills M.D.   On: 12/20/2022 23:42   CT Head Wo Contrast  Result Date: 12/20/2022 CLINICAL DATA:  Head trauma, minor (Age >= 65y); Neck trauma (Age >= 65y), fall from roof EXAM: CT HEAD WITHOUT CONTRAST CT CERVICAL SPINE WITHOUT CONTRAST TECHNIQUE: Multidetector CT imaging of the head and cervical spine was performed following the standard protocol without intravenous contrast. Multiplanar CT image reconstructions of the cervical spine were also generated. RADIATION DOSE REDUCTION: This exam was performed according to the departmental dose-optimization program which includes automated exposure control, adjustment of the mA and/or kV according to patient size and/or use of  iterative reconstruction technique. COMPARISON:  None Available. FINDINGS: CT HEAD FINDINGS Brain: Normal anatomic configuration. Parenchymal volume loss is commensurate with the patient's age. Moderate periventricular white matter changes are present likely reflecting the sequela of small vessel ischemia. Remote left thalamic infarct again noted. No abnormal intra or extra-axial mass lesion or fluid collection. No abnormal mass effect or midline shift. No evidence of acute intracranial hemorrhage or infarct. Ventricular size is normal. Cerebellum unremarkable. Vascular: No asymmetric hyperdense vasculature at the skull base. Skull: Intact Sinuses/Orbits: Right maxillary antrectomy has been performed. Mucous retention cyst noted within the visualized left maxillary sinus. Otherwise, paranasal sinuses are clear. Orbits are unremarkable. Other: Mastoid air cells and middle ear cavities are clear. CT CERVICAL SPINE FINDINGS Alignment: Normal. Skull base and vertebrae: No acute fracture. No primary bone lesion or focal pathologic process. Soft tissues and spinal canal: No prevertebral fluid or swelling. No visible canal hematoma. Posterior disc osteophyte complex at C4-5 and posterior disc bulge at C3-4 in combination with laminar hypertrophy and mild shortening of the pedicles results in mild central canal stenosis with flattening of the thecal  sac. Spinal canal is otherwise widely patent. Disc levels: There is intervertebral disc space narrowing and endplate remodeling of C4-C7 in keeping with changes of mild to moderate degenerative disc disease, most severe at C4-5. Prevertebral soft tissues are not thickened on sagittal reformats. Multilevel uncovertebral and facet arthrosis results in multilevel moderate to severe neuroforaminal narrowing, most severe bilaterally at C4-5, on the left at C5-6, and bilaterally at C6-7. Upper chest: Negative. Other: None IMPRESSION: 1. No acute intracranial abnormality. No calvarial  fracture. 2. Stable senescent change and remote left thalamic infarct. 3. No acute fracture or listhesis of the cervical spine. 4. Multilevel degenerative disc and degenerative joint disease resulting in multilevel moderate to severe neuroforaminal narrowing as described above. Electronically Signed   By: Helyn Numbers M.D.   On: 12/20/2022 21:57   CT Cervical Spine Wo Contrast  Result Date: 12/20/2022 CLINICAL DATA:  Head trauma, minor (Age >= 65y); Neck trauma (Age >= 65y), fall from roof EXAM: CT HEAD WITHOUT CONTRAST CT CERVICAL SPINE WITHOUT CONTRAST TECHNIQUE: Multidetector CT imaging of the head and cervical spine was performed following the standard protocol without intravenous contrast. Multiplanar CT image reconstructions of the cervical spine were also generated. RADIATION DOSE REDUCTION: This exam was performed according to the departmental dose-optimization program which includes automated exposure control, adjustment of the mA and/or kV according to patient size and/or use of iterative reconstruction technique. COMPARISON:  None Available. FINDINGS: CT HEAD FINDINGS Brain: Normal anatomic configuration. Parenchymal volume loss is commensurate with the patient's age. Moderate periventricular white matter changes are present likely reflecting the sequela of small vessel ischemia. Remote left thalamic infarct again noted. No abnormal intra or extra-axial mass lesion or fluid collection. No abnormal mass effect or midline shift. No evidence of acute intracranial hemorrhage or infarct. Ventricular size is normal. Cerebellum unremarkable. Vascular: No asymmetric hyperdense vasculature at the skull base. Skull: Intact Sinuses/Orbits: Right maxillary antrectomy has been performed. Mucous retention cyst noted within the visualized left maxillary sinus. Otherwise, paranasal sinuses are clear. Orbits are unremarkable. Other: Mastoid air cells and middle ear cavities are clear. CT CERVICAL SPINE FINDINGS  Alignment: Normal. Skull base and vertebrae: No acute fracture. No primary bone lesion or focal pathologic process. Soft tissues and spinal canal: No prevertebral fluid or swelling. No visible canal hematoma. Posterior disc osteophyte complex at C4-5 and posterior disc bulge at C3-4 in combination with laminar hypertrophy and mild shortening of the pedicles results in mild central canal stenosis with flattening of the thecal sac. Spinal canal is otherwise widely patent. Disc levels: There is intervertebral disc space narrowing and endplate remodeling of C4-C7 in keeping with changes of mild to moderate degenerative disc disease, most severe at C4-5. Prevertebral soft tissues are not thickened on sagittal reformats. Multilevel uncovertebral and facet arthrosis results in multilevel moderate to severe neuroforaminal narrowing, most severe bilaterally at C4-5, on the left at C5-6, and bilaterally at C6-7. Upper chest: Negative. Other: None IMPRESSION: 1. No acute intracranial abnormality. No calvarial fracture. 2. Stable senescent change and remote left thalamic infarct. 3. No acute fracture or listhesis of the cervical spine. 4. Multilevel degenerative disc and degenerative joint disease resulting in multilevel moderate to severe neuroforaminal narrowing as described above. Electronically Signed   By: Helyn Numbers M.D.   On: 12/20/2022 21:57   CT Hip Left Wo Contrast  Result Date: 12/20/2022 CLINICAL DATA:  Left hip trauma and pain.  Concern for fracture. EXAM: CT OF THE LEFT HIP WITHOUT CONTRAST TECHNIQUE: Multidetector CT  imaging of the left hip was performed according to the standard protocol. Multiplanar CT image reconstructions were also generated. RADIATION DOSE REDUCTION: This exam was performed according to the departmental dose-optimization program which includes automated exposure control, adjustment of the mA and/or kV according to patient size and/or use of iterative reconstruction technique.  COMPARISON:  None Available. FINDINGS: Bones/Joint/Cartilage There is a minimally displaced and impacted fracture of the left femoral neck. The bones are mildly osteopenic. There is no dislocation. No joint effusion. Ligaments Suboptimally assessed by CT. Muscles and Tendons No intramuscular fluid collection or hematoma. Soft tissues Sigmoid diverticulosis. IMPRESSION: Minimally displaced and impacted fracture of the left femoral neck. Electronically Signed   By: Elgie Collard M.D.   On: 12/20/2022 21:55   DG Chest Portable 1 View  Result Date: 12/20/2022 CLINICAL DATA:  Fall EXAM: PORTABLE CHEST 1 VIEW COMPARISON:  None Available. FINDINGS: Mild bronchitic changes are seen centrally, new since prior examination. No confluent pulmonary infiltrate. No pneumothorax or pleural effusion. Cardiac size within normal limits. No acute bone abnormality. IMPRESSION: 1. Mild bronchitic changes. Electronically Signed   By: Helyn Numbers M.D.   On: 12/20/2022 20:36    Review of Systems  HENT:  Negative for ear discharge, ear pain, hearing loss and tinnitus.   Eyes:  Negative for photophobia and pain.  Respiratory:  Negative for cough and shortness of breath.   Cardiovascular:  Negative for chest pain.  Gastrointestinal:  Negative for abdominal pain, nausea and vomiting.  Genitourinary:  Negative for dysuria, flank pain, frequency and urgency.  Musculoskeletal:  Positive for arthralgias (Left hip). Negative for back pain, myalgias and neck pain.  Neurological:  Negative for dizziness and headaches.  Hematological:  Does not bruise/bleed easily.  Psychiatric/Behavioral:  The patient is not nervous/anxious.    Blood pressure 132/68, pulse 82, temperature 98.6 F (37 C), temperature source Oral, resp. rate 18, SpO2 92%. Physical Exam Constitutional:      General: He is not in acute distress.    Appearance: He is well-developed. He is not diaphoretic.  HENT:     Head: Normocephalic and atraumatic.  Eyes:      General: No scleral icterus.       Right eye: No discharge.        Left eye: No discharge.     Conjunctiva/sclera: Conjunctivae normal.  Cardiovascular:     Rate and Rhythm: Normal rate and regular rhythm.  Pulmonary:     Effort: Pulmonary effort is normal. No respiratory distress.  Musculoskeletal:     Cervical back: Normal range of motion.     Comments: LLE No traumatic wounds, ecchymosis, or rash  Mild TTP hip  No knee or ankle effusion  Knee stable to varus/ valgus and anterior/posterior stress  Sens DPN, SPN, TN intact  Motor EHL, ext, flex, evers 5/5  DP 2+, PT 2+, No significant edema  Skin:    General: Skin is warm and dry.  Neurological:     Mental Status: He is alert.  Psychiatric:        Mood and Affect: Mood normal.        Behavior: Behavior normal.    Assessment/Plan: Left hip fx -- Will need THA, timing and surgeon TBD. Will allow to eat today.    Freeman Caldron, PA-C Orthopedic Surgery [phone] 12/21/2022, 9:34 AM

## 2022-12-22 DIAGNOSIS — Z8781 Personal history of (healed) traumatic fracture: Secondary | ICD-10-CM

## 2022-12-22 DIAGNOSIS — S72002A Fracture of unspecified part of neck of left femur, initial encounter for closed fracture: Secondary | ICD-10-CM | POA: Diagnosis not present

## 2022-12-22 HISTORY — DX: Personal history of (healed) traumatic fracture: Z87.81

## 2022-12-22 LAB — GLUCOSE, CAPILLARY
Glucose-Capillary: 166 mg/dL — ABNORMAL HIGH (ref 70–99)
Glucose-Capillary: 169 mg/dL — ABNORMAL HIGH (ref 70–99)
Glucose-Capillary: 184 mg/dL — ABNORMAL HIGH (ref 70–99)
Glucose-Capillary: 202 mg/dL — ABNORMAL HIGH (ref 70–99)
Glucose-Capillary: 202 mg/dL — ABNORMAL HIGH (ref 70–99)
Glucose-Capillary: 219 mg/dL — ABNORMAL HIGH (ref 70–99)

## 2022-12-22 LAB — BASIC METABOLIC PANEL
Anion gap: 10 (ref 5–15)
BUN: 16 mg/dL (ref 8–23)
CO2: 25 mmol/L (ref 22–32)
Calcium: 8.4 mg/dL — ABNORMAL LOW (ref 8.9–10.3)
Chloride: 102 mmol/L (ref 98–111)
Creatinine, Ser: 1.5 mg/dL — ABNORMAL HIGH (ref 0.61–1.24)
GFR, Estimated: 46 mL/min — ABNORMAL LOW (ref >60)
Glucose, Bld: 208 mg/dL — ABNORMAL HIGH (ref 70–99)
Potassium: 3.6 mmol/L (ref 3.5–5.1)
Sodium: 137 mmol/L (ref 135–145)

## 2022-12-22 LAB — CBC
HCT: 36.3 % — ABNORMAL LOW (ref 39.0–52.0)
Hemoglobin: 12.6 g/dL — ABNORMAL LOW (ref 13.0–17.0)
MCH: 31.9 pg (ref 26.0–34.0)
MCHC: 34.7 g/dL (ref 30.0–36.0)
MCV: 91.9 fL (ref 80.0–100.0)
Platelets: 187 10*3/uL (ref 150–400)
RBC: 3.95 MIL/uL — ABNORMAL LOW (ref 4.22–5.81)
RDW: 13.1 % (ref 11.5–15.5)
WBC: 10.1 10*3/uL (ref 4.0–10.5)
nRBC: 0 % (ref 0.0–0.2)

## 2022-12-22 LAB — HEMOGLOBIN A1C
Hgb A1c MFr Bld: 9.6 % — ABNORMAL HIGH (ref 4.8–5.6)
Mean Plasma Glucose: 229 mg/dL

## 2022-12-22 LAB — SURGICAL PCR SCREEN
MRSA, PCR: NEGATIVE
Staphylococcus aureus: NEGATIVE

## 2022-12-22 MED ORDER — CEFAZOLIN SODIUM-DEXTROSE 2-4 GM/100ML-% IV SOLN
2.0000 g | INTRAVENOUS | Status: AC
  Administered 2022-12-23: 2 g via INTRAVENOUS
  Filled 2022-12-22: qty 100

## 2022-12-22 MED ORDER — TRANEXAMIC ACID-NACL 1000-0.7 MG/100ML-% IV SOLN
1000.0000 mg | INTRAVENOUS | Status: AC
  Administered 2022-12-23: 1000 mg via INTRAVENOUS
  Filled 2022-12-22: qty 100

## 2022-12-22 MED ORDER — CHLORHEXIDINE GLUCONATE 4 % EX SOLN: 60.0000 mL | Freq: Once | CUTANEOUS | Status: DC

## 2022-12-22 MED ORDER — POVIDONE-IODINE 10 % EX SWAB: 2.0000 | Freq: Once | CUTANEOUS | Status: DC

## 2022-12-22 MED ORDER — CAMPHOR-MENTHOL 0.5-0.5 % EX LOTN: TOPICAL_LOTION | CUTANEOUS | Status: DC | PRN

## 2022-12-22 MED ORDER — POVIDONE-IODINE 10 % EX SWAB
2.0000 | Freq: Once | CUTANEOUS | Status: AC
  Administered 2022-12-23: 2 via TOPICAL

## 2022-12-22 MED ORDER — CEPHALEXIN 500 MG PO CAPS
500.0000 mg | ORAL_CAPSULE | Freq: Three times a day (TID) | ORAL | Status: AC
  Administered 2022-12-22 – 2022-12-25 (×11): 500 mg via ORAL
  Filled 2022-12-22 (×11): qty 1

## 2022-12-22 MED ORDER — SODIUM CHLORIDE 0.45 % IV SOLN: INTRAVENOUS | Status: DC

## 2022-12-22 NOTE — Care Management Important Message (Signed)
Important Message  Patient Details  Name: KEVAL WOLSKE MRN: [id number] Date of Birth: 04/11/1941   Medicare Important Message Given:  Yes     Sherilyn Banker 12/22/2022, 3:43 PM

## 2022-12-22 NOTE — Discharge Summary (Signed)
[facility]  Physician Discharge Summary   Patient ID: Luke Edwards MRN: [id number] DOB/AGE: 82/03/1941 82 y.o.  Admit date: 12/20/2022 Discharge date:   12/22/2022   PCP: Galvin Proffer, MD  DISCHARGE DIAGNOSES:    Closed fracture of neck of left femur (HCC)   Essential hypertension   DM2 (diabetes mellitus, type 2) (HCC)   Fall   BPH (benign prostatic hyperplasia)  Patient to be transferred to [facility] for further management of his fracture  CODE STATUS: Full code   INITIAL HISTORY: 82 year old male with past medical history of diabetes mellitus type 2, essential hypertension presented after sustaining a mechanical fall at home.  Was found to have left hip fracture.  Was hospitalized for further management.   Consultants: Orthopedics  HOSPITAL COURSE:   Left hip fracture Orthopedics consulted.  Patient needs total hip arthroplasty.  However due to unavailability of surgeon and anesthesia the surgery cannot be done in the next few days.  In view of this orthopedics is recommending transfer to a different facility.  Discussed with Dr. Christell Constant who is an orthopedic surgeon at [facility] who is willing to operate on this patient.   Diabetes mellitus type 2 On basal insulin at home.  Monitor CBGs.  Currently just on SSI.   Essential hypertension Holding lisinopril and HCTZ for now.  High blood pressure readings are likely due to pain issues.  AKI Mild increase in creatinine noted.  Noted to be 1.5 today from 1.36 yesterday.  Likely due to n.p.o. status.  Will initiate gentle IV hydration.  Monitor urine output.  Labs will need to be rechecked tomorrow.   Hypokalemia Supplemented.  Improvement in levels noted.  Magnesium 1.8.   Normocytic anemia Monitor hemoglobin closely.  Hemoglobin 12.6 today.   History of BPH Continue with tamsulosin and Proscar.  Patient is stable.  Awaiting transfer to [facility] for further management of his  fracture.   PERTINENT LABS:  The results of significant diagnostics from this hospitalization (including imaging, microbiology, ancillary and laboratory) are listed below for reference.    Microbiology: Recent Results (from the past 240 hour(s))  Surgical PCR screen     Status: None   Collection Time: 12/21/22 11:57 PM   Specimen: Nasal Mucosa; Nasal Swab  Result Value Ref Range Status   MRSA, PCR NEGATIVE NEGATIVE Final   Staphylococcus aureus NEGATIVE NEGATIVE Final    Comment: (NOTE) The Xpert SA Assay (FDA approved for NASAL specimens in patients 82 years of age and older), is one component of a comprehensive surveillance program. It is not intended to diagnose infection nor to guide or monitor treatment. Performed at [facility], [address], [place], [place] [address]      Labs:   Basic Metabolic Panel: Recent Labs  Lab 12/21/22 0000 12/21/22 0409 12/22/22 0647  NA 138 136 137  K 2.9* 3.4* 3.6  CL 104 104 102  CO2 20* 23 25  GLUCOSE 186* 293* 208*  BUN 14 14 16   CREATININE 1.41* 1.36* 1.50*  CALCIUM 8.8* 8.3* 8.4*  MG 1.7 1.8  --    Liver Function Tests: Recent Labs  Lab 12/21/22 0409  AST 16  ALT 17  ALKPHOS 54  BILITOT 1.4*  PROT 6.5  ALBUMIN 3.7    CBC: Recent Labs  Lab 12/21/22 0000 12/21/22 0409 12/22/22 0647  WBC 15.4* 12.6* 10.1  NEUTROABS 11.1* 10.7*  --   HGB 12.9* 12.7* 12.6*  HCT 36.7*  36.5* 36.3*  MCV 90.4 89.0 91.9  PLT 230 212 187     CBG: Recent Labs  Lab 12/21/22 1652 12/21/22 1941 12/21/22 2345 12/22/22 0404 12/22/22 0804  GLUCAP 183* 218* 202* 169* 219*     IMAGING STUDIES DG Knee 2 Views Left  Result Date: 12/20/2022 CLINICAL DATA:  Fall, hip fracture EXAM: LEFT KNEE - 1-2 VIEW COMPARISON:  None Available. FINDINGS: No fracture or dislocation is seen. Moderate tricompartmental degenerative changes, prominent in the medial compartment. Visualized soft tissues are within normal limits. No  suprapatellar knee joint effusion. IMPRESSION: No fracture or dislocation is seen. Moderate degenerative changes. Electronically Signed   By: Charline Bills M.D.   On: 12/20/2022 23:42   CT Head Wo Contrast  Result Date: 12/20/2022 CLINICAL DATA:  Head trauma, minor (Age >= 82y); Neck trauma (Age >= 82y), fall from roof EXAM: CT HEAD WITHOUT CONTRAST CT CERVICAL SPINE WITHOUT CONTRAST TECHNIQUE: Multidetector CT imaging of the head and cervical spine was performed following the standard protocol without intravenous contrast. Multiplanar CT image reconstructions of the cervical spine were also generated. RADIATION DOSE REDUCTION: This exam was performed according to the departmental dose-optimization program which includes automated exposure control, adjustment of the mA and/or kV according to patient size and/or use of iterative reconstruction technique. COMPARISON:  None Available. FINDINGS: CT HEAD FINDINGS Brain: Normal anatomic configuration. Parenchymal volume loss is commensurate with the patient's age. Moderate periventricular white matter changes are present likely reflecting the sequela of small vessel ischemia. Remote left thalamic infarct again noted. No abnormal intra or extra-axial mass lesion or fluid collection. No abnormal mass effect or midline shift. No evidence of acute intracranial hemorrhage or infarct. Ventricular size is normal. Cerebellum unremarkable. Vascular: No asymmetric hyperdense vasculature at the skull base. Skull: Intact Sinuses/Orbits: Right maxillary antrectomy has been performed. Mucous retention cyst noted within the visualized left maxillary sinus. Otherwise, paranasal sinuses are clear. Orbits are unremarkable. Other: Mastoid air cells and middle ear cavities are clear. CT CERVICAL SPINE FINDINGS Alignment: Normal. Skull base and vertebrae: No acute fracture. No primary bone lesion or focal pathologic process. Soft tissues and spinal canal: No prevertebral fluid or  swelling. No visible canal hematoma. Posterior disc osteophyte complex at C4-5 and posterior disc bulge at C3-4 in combination with laminar hypertrophy and mild shortening of the pedicles results in mild central canal stenosis with flattening of the thecal sac. Spinal canal is otherwise widely patent. Disc levels: There is intervertebral disc space narrowing and endplate remodeling of C4-C7 in keeping with changes of mild to moderate degenerative disc disease, most severe at C4-5. Prevertebral soft tissues are not thickened on sagittal reformats. Multilevel uncovertebral and facet arthrosis results in multilevel moderate to severe neuroforaminal narrowing, most severe bilaterally at C4-5, on the left at C5-6, and bilaterally at C6-7. Upper chest: Negative. Other: None IMPRESSION: 1. No acute intracranial abnormality. No calvarial fracture. 2. Stable senescent change and remote left thalamic infarct. 3. No acute fracture or listhesis of the cervical spine. 4. Multilevel degenerative disc and degenerative joint disease resulting in multilevel moderate to severe neuroforaminal narrowing as described above. Electronically Signed   By: Helyn Numbers M.D.   On: 12/20/2022 21:57   CT Cervical Spine Wo Contrast  Result Date: 12/20/2022 CLINICAL DATA:  Head trauma, minor (Age >= 82y); Neck trauma (Age >= 82y), fall from roof EXAM: CT HEAD WITHOUT CONTRAST CT CERVICAL SPINE WITHOUT CONTRAST TECHNIQUE: Multidetector CT imaging of the head and cervical spine was performed following  the standard protocol without intravenous contrast. Multiplanar CT image reconstructions of the cervical spine were also generated. RADIATION DOSE REDUCTION: This exam was performed according to the departmental dose-optimization program which includes automated exposure control, adjustment of the mA and/or kV according to patient size and/or use of iterative reconstruction technique. COMPARISON:  None Available. FINDINGS: CT HEAD FINDINGS  Brain: Normal anatomic configuration. Parenchymal volume loss is commensurate with the patient's age. Moderate periventricular white matter changes are present likely reflecting the sequela of small vessel ischemia. Remote left thalamic infarct again noted. No abnormal intra or extra-axial mass lesion or fluid collection. No abnormal mass effect or midline shift. No evidence of acute intracranial hemorrhage or infarct. Ventricular size is normal. Cerebellum unremarkable. Vascular: No asymmetric hyperdense vasculature at the skull base. Skull: Intact Sinuses/Orbits: Right maxillary antrectomy has been performed. Mucous retention cyst noted within the visualized left maxillary sinus. Otherwise, paranasal sinuses are clear. Orbits are unremarkable. Other: Mastoid air cells and middle ear cavities are clear. CT CERVICAL SPINE FINDINGS Alignment: Normal. Skull base and vertebrae: No acute fracture. No primary bone lesion or focal pathologic process. Soft tissues and spinal canal: No prevertebral fluid or swelling. No visible canal hematoma. Posterior disc osteophyte complex at C4-5 and posterior disc bulge at C3-4 in combination with laminar hypertrophy and mild shortening of the pedicles results in mild central canal stenosis with flattening of the thecal sac. Spinal canal is otherwise widely patent. Disc levels: There is intervertebral disc space narrowing and endplate remodeling of C4-C7 in keeping with changes of mild to moderate degenerative disc disease, most severe at C4-5. Prevertebral soft tissues are not thickened on sagittal reformats. Multilevel uncovertebral and facet arthrosis results in multilevel moderate to severe neuroforaminal narrowing, most severe bilaterally at C4-5, on the left at C5-6, and bilaterally at C6-7. Upper chest: Negative. Other: None IMPRESSION: 1. No acute intracranial abnormality. No calvarial fracture. 2. Stable senescent change and remote left thalamic infarct. 3. No acute fracture  or listhesis of the cervical spine. 4. Multilevel degenerative disc and degenerative joint disease resulting in multilevel moderate to severe neuroforaminal narrowing as described above. Electronically Signed   By: Helyn Numbers M.D.   On: 12/20/2022 21:57   CT Hip Left Wo Contrast  Result Date: 12/20/2022 CLINICAL DATA:  Left hip trauma and pain.  Concern for fracture. EXAM: CT OF THE LEFT HIP WITHOUT CONTRAST TECHNIQUE: Multidetector CT imaging of the left hip was performed according to the standard protocol. Multiplanar CT image reconstructions were also generated. RADIATION DOSE REDUCTION: This exam was performed according to the departmental dose-optimization program which includes automated exposure control, adjustment of the mA and/or kV according to patient size and/or use of iterative reconstruction technique. COMPARISON:  None Available. FINDINGS: Bones/Joint/Cartilage There is a minimally displaced and impacted fracture of the left femoral neck. The bones are mildly osteopenic. There is no dislocation. No joint effusion. Ligaments Suboptimally assessed by CT. Muscles and Tendons No intramuscular fluid collection or hematoma. Soft tissues Sigmoid diverticulosis. IMPRESSION: Minimally displaced and impacted fracture of the left femoral neck. Electronically Signed   By: Elgie Collard M.D.   On: 12/20/2022 21:55   DG Chest Portable 1 View  Result Date: 12/20/2022 CLINICAL DATA:  Fall EXAM: PORTABLE CHEST 1 VIEW COMPARISON:  None Available. FINDINGS: Mild bronchitic changes are seen centrally, new since prior examination. No confluent pulmonary infiltrate. No pneumothorax or pleural effusion. Cardiac size within normal limits. No acute bone abnormality. IMPRESSION: 1. Mild bronchitic changes. Electronically Signed  By: Helyn Numbers M.D.   On: 12/20/2022 20:36    DISCHARGE EXAMINATION: Vitals:   12/21/22 1944 12/22/22 0408 12/22/22 0500 12/22/22 0806  BP: (!) 161/84 (!) 160/82  (!) 170/85   Pulse: 88 80  90  Resp: 16 12  16   Temp: 99.3 F (37.4 C) 98.6 F (37 C)  98.7 F (37.1 C)  TempSrc: Oral Oral  Oral  SpO2: 95% 96%  94%  Weight:   95.4 kg    General appearance: Awake alert.  In no distress Resp: Clear to auscultation bilaterally.  Normal effort Cardio: S1-S2 is normal regular.  No S3-S4.  No rubs murmurs or bruit GI: Abdomen is soft.  Nontender nondistended.  Bowel sounds are present normal.  No masses organomegaly      Current Inpatient Medications: Scheduled:  cephALEXin  500 mg Oral Q8H   finasteride  5 mg Oral QHS   gabapentin  600 mg Oral BID   insulin aspart  0-15 Units Subcutaneous Q4H   insulin glargine-yfgn  5 Units Subcutaneous Daily   mupirocin ointment  1 Application Nasal BID   ondansetron (ZOFRAN) IV  4 mg Intravenous Once   tamsulosin  0.4 mg Oral QPC supper   Continuous:  sodium chloride 75 mL/hr at 12/22/22 0838   promethazine (PHENERGAN) injection (IM or IVPB)     ZOX:[id number] **OR** acetaminophen, camphor-menthol, fentaNYL (SUBLIMAZE) injection, melatonin, naLOXone (NARCAN)  injection, ondansetron (ZOFRAN) IV, promethazine (PHENERGAN) injection (IM or IVPB)      Robertlee Rogacki [employer] on [email or web address]  12/22/2022, 10:40 AM

## 2022-12-22 NOTE — TOC Progression Note (Signed)
Transition of Care [facility]) - Progression Note    Patient Details  Name: Luke Edwards MRN: [id number] Date of Birth: 1940-06-23  Transition of [facility] CM/SW Contact  Epifanio Lesches, RN Phone Number: 12/22/2022, 11:35 AM  Clinical Narrative:    Left hip fracture  Patient will transfer to [facility] 2/2 unavailability of surgeon and anesthesia. Receiving MD :  Dr.Dalbow  RN, patient and patient's family notified of plan.   Nurse to call report  to  [facility] receiving nurse @ [phone]. [facility] will provide transportation, [phone].   RNCM will sign off for now as intervention is no longer needed. Please consult [place] again if new needs arise.    Expected Discharge Plan: Acute to Acute Transfer Barriers to Discharge: No Barriers Identified  Expected Discharge Plan and Services         Expected Discharge Date: 12/22/22                                     Social Determinants of Health (SDOH) Interventions SDOH Screenings   Food Insecurity: No Food Insecurity (12/21/2022)  Tobacco Use: Low Risk  (12/20/2022)    Readmission Risk Interventions     No data to display

## 2022-12-22 NOTE — Plan of Care (Signed)

## 2022-12-22 NOTE — Anesthesia Preprocedure Evaluation (Addendum)
Anesthesia Evaluation  Patient identified by MRN, date of birth, ID band Patient awake    Reviewed: Allergy & Precautions, NPO status , Patient's Chart, lab work & pertinent test results  History of Anesthesia Complications Negative for: history of anesthetic complications  Airway Mallampati: III  TM Distance: >3 FB Neck ROM: Full    Dental  (+) Edentulous Upper, Edentulous Lower   Pulmonary neg pulmonary ROS   Pulmonary exam normal breath sounds clear to auscultation       Cardiovascular hypertension (lisinopril-HCTZ), Pt. on medications (-) angina (-) Past MI, (-) Cardiac Stents and (-) CABG (-) dysrhythmias + Valvular Problems/Murmurs (mild) AI  Rhythm:Regular Rate:Normal  HLD  ETT negative for ischemia 03/23/2017  TTE 03/23/2017: Study Conclusions   - Left ventricle: The cavity size was normal. Wall thickness was    increased in a pattern of mild LVH. There was focal basal    hypertrophy. Systolic function was normal. The estimated ejection    fraction was in the range of 55% to 60%. Wall motion was normal;    there were no regional wall motion abnormalities. Doppler    parameters are consistent with abnormal left ventricular    relaxation (grade 1 diastolic dysfunction).  - Aortic valve: There was mild regurgitation.  - Pulmonary arteries: PA peak pressure: 35 mm Hg (S).     Neuro/Psych negative neurological ROS     GI/Hepatic Neg liver ROS,GERD  Medicated,,  Endo/Other  diabetes, Type 2, Oral Hypoglycemic Agents, Insulin Dependent    Renal/GU negative Renal ROS     Musculoskeletal   Abdominal   Peds  Hematology  (+) Blood dyscrasia, anemia Lab Results      Component                Value               Date                      WBC                      10.1                12/22/2022                HGB                      12.6 (L)            12/22/2022                HCT                      36.3 (L)             12/22/2022                MCV                      91.9                12/22/2022                PLT                      187                 12/22/2022  Anesthesia Other Findings   Reproductive/Obstetrics                             Anesthesia Physical Anesthesia Plan  ASA: 2  Anesthesia Plan: General   Post-op Pain Management: Tylenol PO (pre-op)*   Induction: Intravenous  PONV Risk Score and Plan: 2 and Ondansetron, Dexamethasone and Treatment may vary due to age or medical condition  Airway Management Planned: Oral ETT  Additional Equipment:   Intra-op Plan:   Post-operative Plan: Extubation in OR  Informed Consent: I have reviewed the patients History and Physical, chart, labs and discussed the procedure including the risks, benefits and alternatives for the proposed anesthesia with the patient or authorized representative who has indicated his/her understanding and acceptance.     Dental advisory given  Plan Discussed with: Anesthesiologist and CRNA  Anesthesia Plan Comments:         Anesthesia Quick Evaluation

## 2022-12-22 NOTE — Inpatient Diabetes Management (Signed)
Inpatient Diabetes Program Recommendations  AACE/ADA: New Consensus Statement on Inpatient Glycemic Control (2015)  Target Ranges:  Prepandial:   less than 140 mg/dL      Peak postprandial:   less than 180 mg/dL (1-2 hours)      Critically ill patients:  140 - 180 mg/dL   Lab Results  Component Value Date   GLUCAP 219 (H) 12/22/2022   HGBA1C 9.6 (H) 12/21/2022    Review of Glycemic Control  Latest Reference Range & Units 12/21/22 08:13 12/21/22 11:39 12/21/22 16:52 12/21/22 19:41 12/21/22 23:45 12/22/22 04:04 12/22/22 08:04  Glucose-Capillary 70 - 99 mg/dL 295 (H) 621 (H) 308 (H) 218 (H) 202 (H) 169 (H) 219 (H)   Diabetes history: DM 2 Outpatient Diabetes medications: Lantus 65 units Daily, Novolin R 6 units tid mea coverage, Glipizide 10 mg Daily Current orders for Inpatient glycemic control:  Semglee 5 units Daily Novolog 0-15 units Q4 hours  A1c 9.6% on 9/11  Dr. Katrinka Blazing, Endocrinologist  Spoke with pt at bedside regarding A1c 9.6% and glucose control at home. Pt and daughter reports that his Endocrinologist, Dr. Katrinka Blazing does not restrict pts diet because at his age he does not expect pt to make huge dietary changes. He added meal coverage insulin, which pt is not the best at taking. Discussed glucose and A1c goals to promote wound healing. Pt to be transferred.   Thanks,  Christena Deem RN, MSN, BC-ADM Inpatient Diabetes Coordinator Team Pager [phone] (8a-5p)

## 2022-12-22 NOTE — Progress Notes (Signed)
Asked to help regarding patient's left femoral neck fracture. Patient needs L THA for pain control and immediate mobilization OOB. Due to OR availability, surgery will be at [facility] tomorrow am. NPO after MN tonight. Hold chemical DVT ppx. Preop orders placed.

## 2022-12-23 ENCOUNTER — Inpatient Hospital Stay (HOSPITAL_COMMUNITY): Payer: Medicare HMO | Admitting: Anesthesiology

## 2022-12-23 ENCOUNTER — Inpatient Hospital Stay (HOSPITAL_COMMUNITY): Payer: Medicare HMO

## 2022-12-23 ENCOUNTER — Encounter (HOSPITAL_COMMUNITY)
Admission: EM | Disposition: A | Discharge status: Home Health | Payer: Self-pay | Source: Home / Self Care | Attending: Internal Medicine

## 2022-12-23 ENCOUNTER — Other Ambulatory Visit: Payer: Self-pay

## 2022-12-23 DIAGNOSIS — Z794 Long term (current) use of insulin: Secondary | ICD-10-CM | POA: Diagnosis not present

## 2022-12-23 DIAGNOSIS — S72002A Fracture of unspecified part of neck of left femur, initial encounter for closed fracture: Secondary | ICD-10-CM | POA: Diagnosis not present

## 2022-12-23 DIAGNOSIS — S72042A Displaced fracture of base of neck of left femur, initial encounter for closed fracture: Secondary | ICD-10-CM

## 2022-12-23 DIAGNOSIS — I10 Essential (primary) hypertension: Secondary | ICD-10-CM | POA: Diagnosis not present

## 2022-12-23 DIAGNOSIS — E119 Type 2 diabetes mellitus without complications: Secondary | ICD-10-CM | POA: Diagnosis not present

## 2022-12-23 HISTORY — PX: TOTAL HIP ARTHROPLASTY: SHX124

## 2022-12-23 LAB — CBC
HCT: 35.5 % — ABNORMAL LOW (ref 39.0–52.0)
Hemoglobin: 12 g/dL — ABNORMAL LOW (ref 13.0–17.0)
MCH: 30.3 pg (ref 26.0–34.0)
MCHC: 33.8 g/dL (ref 30.0–36.0)
MCV: 89.6 fL (ref 80.0–100.0)
Platelets: 190 10*3/uL (ref 150–400)
RBC: 3.96 MIL/uL — ABNORMAL LOW (ref 4.22–5.81)
RDW: 12.8 % (ref 11.5–15.5)
WBC: 9.2 10*3/uL (ref 4.0–10.5)
nRBC: 0 % (ref 0.0–0.2)

## 2022-12-23 LAB — BASIC METABOLIC PANEL
Anion gap: 8 (ref 5–15)
BUN: 15 mg/dL (ref 8–23)
CO2: 23 mmol/L (ref 22–32)
Calcium: 7.8 mg/dL — ABNORMAL LOW (ref 8.9–10.3)
Chloride: 103 mmol/L (ref 98–111)
Creatinine, Ser: 1.3 mg/dL — ABNORMAL HIGH (ref 0.61–1.24)
GFR, Estimated: 55 mL/min — ABNORMAL LOW (ref >60)
Glucose, Bld: 152 mg/dL — ABNORMAL HIGH (ref 70–99)
Potassium: 3.1 mmol/L — ABNORMAL LOW (ref 3.5–5.1)
Sodium: 134 mmol/L — ABNORMAL LOW (ref 135–145)

## 2022-12-23 LAB — MAGNESIUM: Magnesium: 2 mg/dL (ref 1.7–2.4)

## 2022-12-23 LAB — GLUCOSE, CAPILLARY
Glucose-Capillary: 134 mg/dL — ABNORMAL HIGH (ref 70–99)
Glucose-Capillary: 159 mg/dL — ABNORMAL HIGH (ref 70–99)
Glucose-Capillary: 171 mg/dL — ABNORMAL HIGH (ref 70–99)
Glucose-Capillary: 199 mg/dL — ABNORMAL HIGH (ref 70–99)
Glucose-Capillary: 203 mg/dL — ABNORMAL HIGH (ref 70–99)
Glucose-Capillary: 219 mg/dL — ABNORMAL HIGH (ref 70–99)
Glucose-Capillary: 266 mg/dL — ABNORMAL HIGH (ref 70–99)
Glucose-Capillary: 282 mg/dL — ABNORMAL HIGH (ref 70–99)

## 2022-12-23 LAB — HEMOGLOBIN A1C
Hgb A1c MFr Bld: 9.7 % — ABNORMAL HIGH (ref 4.8–5.6)
Mean Plasma Glucose: 232 mg/dL

## 2022-12-23 SURGERY — ARTHROPLASTY, HIP, TOTAL, ANTERIOR APPROACH
Anesthesia: General | Site: Hip | Laterality: Left

## 2022-12-23 MED ORDER — OXYCODONE HCL 5 MG PO TABS: 5.0000 mg | ORAL_TABLET | Freq: Once | ORAL | Status: DC | PRN

## 2022-12-23 MED ORDER — METOCLOPRAMIDE HCL 5 MG PO TABS
5.0000 mg | ORAL_TABLET | Freq: Three times a day (TID) | ORAL | Status: DC | PRN
  Filled 2022-12-23: qty 2

## 2022-12-23 MED ORDER — LIDOCAINE 2% (20 MG/ML) 5 ML SYRINGE
INTRAMUSCULAR | Status: AC
  Filled 2022-12-23: qty 5

## 2022-12-23 MED ORDER — METOCLOPRAMIDE HCL 5 MG/ML IJ SOLN: 5.0000 mg | Freq: Three times a day (TID) | INTRAMUSCULAR | Status: DC | PRN

## 2022-12-23 MED ORDER — ACETAMINOPHEN 10 MG/ML IV SOLN
INTRAVENOUS | Status: DC | PRN
Start: 2022-12-23 — End: 2022-12-23
  Administered 2022-12-23: 1000 mg via INTRAVENOUS

## 2022-12-23 MED ORDER — TRANEXAMIC ACID-NACL 1000-0.7 MG/100ML-% IV SOLN
INTRAVENOUS | Status: AC
  Filled 2022-12-23: qty 100

## 2022-12-23 MED ORDER — ACETAMINOPHEN 500 MG PO TABS
500.0000 mg | ORAL_TABLET | Freq: Four times a day (QID) | ORAL | Status: AC
  Administered 2022-12-23 – 2022-12-24 (×4): 500 mg via ORAL
  Filled 2022-12-23 (×4): qty 1

## 2022-12-23 MED ORDER — HYDROCODONE-ACETAMINOPHEN 7.5-325 MG PO TABS
1.0000 | ORAL_TABLET | ORAL | Status: DC | PRN
  Administered 2022-12-23 – 2022-12-24 (×2): 1 via ORAL
  Administered 2022-12-24: 2 via ORAL
  Administered 2022-12-24 – 2022-12-25 (×4): 1 via ORAL
  Administered 2022-12-25 (×3): 2 via ORAL
  Filled 2022-12-23: qty 2
  Filled 2022-12-23: qty 1
  Filled 2022-12-23: qty 2
  Filled 2022-12-23 (×2): qty 1
  Filled 2022-12-23: qty 2
  Filled 2022-12-23 (×3): qty 1
  Filled 2022-12-23: qty 2

## 2022-12-23 MED ORDER — ASPIRIN 81 MG PO CHEW
81.0000 mg | CHEWABLE_TABLET | Freq: Two times a day (BID) | ORAL | Status: DC
  Administered 2022-12-23 – 2022-12-26 (×6): 81 mg via ORAL
  Filled 2022-12-23 (×6): qty 1

## 2022-12-23 MED ORDER — AMISULPRIDE (ANTIEMETIC) 5 MG/2ML IV SOLN: 10.0000 mg | Freq: Once | INTRAVENOUS | Status: DC | PRN

## 2022-12-23 MED ORDER — METHOCARBAMOL 500 MG PO TABS
500.0000 mg | ORAL_TABLET | Freq: Four times a day (QID) | ORAL | Status: DC | PRN
  Administered 2022-12-23 – 2022-12-26 (×8): 500 mg via ORAL
  Filled 2022-12-23 (×8): qty 1

## 2022-12-23 MED ORDER — CHLORHEXIDINE GLUCONATE 0.12 % MT SOLN
OROMUCOSAL | Status: AC
  Filled 2022-12-23: qty 15

## 2022-12-23 MED ORDER — PHENYLEPHRINE HCL (PRESSORS) 10 MG/ML IV SOLN
INTRAVENOUS | Status: DC | PRN
  Administered 2022-12-23: 160 ug via INTRAVENOUS

## 2022-12-23 MED ORDER — SODIUM CHLORIDE (PF) 0.9 % IJ SOLN
INTRAMUSCULAR | Status: DC | PRN
  Administered 2022-12-23: 30 mL

## 2022-12-23 MED ORDER — HYDROMORPHONE HCL 1 MG/ML IJ SOLN
INTRAMUSCULAR | Status: AC
  Filled 2022-12-23: qty 0.5

## 2022-12-23 MED ORDER — DOCUSATE SODIUM 100 MG PO CAPS
100.0000 mg | ORAL_CAPSULE | Freq: Two times a day (BID) | ORAL | Status: DC
  Administered 2022-12-23 – 2022-12-26 (×7): 100 mg via ORAL
  Filled 2022-12-23 (×7): qty 1

## 2022-12-23 MED ORDER — PHENYLEPHRINE HCL-NACL 20-0.9 MG/250ML-% IV SOLN
INTRAVENOUS | Status: DC | PRN
  Administered 2022-12-23: 20 ug/min via INTRAVENOUS

## 2022-12-23 MED ORDER — ESMOLOL HCL 100 MG/10ML IV SOLN
INTRAVENOUS | Status: DC | PRN
Start: 2022-12-23 — End: 2022-12-23
  Administered 2022-12-23 (×2): 20 mg via INTRAVENOUS

## 2022-12-23 MED ORDER — ROCURONIUM BROMIDE 10 MG/ML (PF) SYRINGE
PREFILLED_SYRINGE | INTRAVENOUS | Status: DC | PRN
  Administered 2022-12-23: 60 mg via INTRAVENOUS

## 2022-12-23 MED ORDER — CEFAZOLIN SODIUM-DEXTROSE 2-4 GM/100ML-% IV SOLN
INTRAVENOUS | Status: AC
  Filled 2022-12-23: qty 100

## 2022-12-23 MED ORDER — SODIUM CHLORIDE (PF) 0.9 % IJ SOLN
INTRAMUSCULAR | Status: AC
  Filled 2022-12-23: qty 50

## 2022-12-23 MED ORDER — ONDANSETRON HCL 4 MG/2ML IJ SOLN
INTRAMUSCULAR | Status: AC
  Filled 2022-12-23: qty 2

## 2022-12-23 MED ORDER — HYDROCODONE-ACETAMINOPHEN 5-325 MG PO TABS
1.0000 | ORAL_TABLET | ORAL | Status: DC | PRN
  Administered 2022-12-23: 2 via ORAL
  Administered 2022-12-23: 1 via ORAL
  Administered 2022-12-26 (×2): 2 via ORAL
  Filled 2022-12-23 (×2): qty 2
  Filled 2022-12-23: qty 1
  Filled 2022-12-23: qty 2

## 2022-12-23 MED ORDER — 0.9 % SODIUM CHLORIDE (POUR BTL) OPTIME
TOPICAL | Status: DC | PRN
  Administered 2022-12-23: 1000 mL

## 2022-12-23 MED ORDER — FENTANYL CITRATE (PF) 250 MCG/5ML IJ SOLN
INTRAMUSCULAR | Status: DC | PRN
  Administered 2022-12-23: 100 ug via INTRAVENOUS
  Administered 2022-12-23: 50 ug via INTRAVENOUS
  Administered 2022-12-23: 100 ug via INTRAVENOUS

## 2022-12-23 MED ORDER — LACTATED RINGERS IV SOLN
INTRAVENOUS | Status: DC | PRN
Start: 2022-12-23 — End: 2022-12-23

## 2022-12-23 MED ORDER — SODIUM CHLORIDE 0.9 % IV SOLN: INTRAVENOUS | Status: DC

## 2022-12-23 MED ORDER — SODIUM CHLORIDE 0.9 % IR SOLN
Status: DC | PRN
Start: 2022-12-23 — End: 2022-12-23
  Administered 2022-12-23: 3000 mL

## 2022-12-23 MED ORDER — BISACODYL 10 MG RE SUPP: 10.0000 mg | Freq: Every day | RECTAL | Status: DC | PRN

## 2022-12-23 MED ORDER — MENTHOL 3 MG MT LOZG: 1.0000 | LOZENGE | OROMUCOSAL | Status: DC | PRN

## 2022-12-23 MED ORDER — INSULIN ASPART 100 UNIT/ML IJ SOLN
INTRAMUSCULAR | Status: DC | PRN
Start: 2022-12-23 — End: 2022-12-23
  Administered 2022-12-23: 4 [IU] via SUBCUTANEOUS

## 2022-12-23 MED ORDER — FENTANYL CITRATE (PF) 250 MCG/5ML IJ SOLN
INTRAMUSCULAR | Status: AC
  Filled 2022-12-23: qty 5

## 2022-12-23 MED ORDER — PROPOFOL 10 MG/ML IV BOLUS
INTRAVENOUS | Status: AC
  Filled 2022-12-23: qty 20

## 2022-12-23 MED ORDER — BUPIVACAINE-EPINEPHRINE (PF) 0.5% -1:200000 IJ SOLN
INTRAMUSCULAR | Status: DC | PRN
  Administered 2022-12-23: 30 mL via PERINEURAL

## 2022-12-23 MED ORDER — ACETAMINOPHEN 10 MG/ML IV SOLN
INTRAVENOUS | Status: AC
  Filled 2022-12-23: qty 100

## 2022-12-23 MED ORDER — KETOROLAC TROMETHAMINE 30 MG/ML IJ SOLN
INTRAMUSCULAR | Status: DC | PRN
  Administered 2022-12-23: 30 mg

## 2022-12-23 MED ORDER — FENTANYL CITRATE (PF) 100 MCG/2ML IJ SOLN: 25.0000 ug | INTRAMUSCULAR | Status: DC | PRN

## 2022-12-23 MED ORDER — PHENOL 1.4 % MT LIQD: 1.0000 | OROMUCOSAL | Status: DC | PRN

## 2022-12-23 MED ORDER — POTASSIUM CHLORIDE 10 MEQ/100ML IV SOLN: 10.0000 meq | INTRAVENOUS | Status: AC

## 2022-12-23 MED ORDER — ALUM & MAG HYDROXIDE-SIMETH 200-200-20 MG/5ML PO SUSP: 30.0000 mL | ORAL | Status: DC | PRN

## 2022-12-23 MED ORDER — MORPHINE SULFATE (PF) 2 MG/ML IV SOLN: 0.5000 mg | INTRAVENOUS | Status: DC | PRN

## 2022-12-23 MED ORDER — SUGAMMADEX SODIUM 200 MG/2ML IV SOLN
INTRAVENOUS | Status: DC | PRN
  Administered 2022-12-23: 200 mg via INTRAVENOUS

## 2022-12-23 MED ORDER — DEXAMETHASONE SODIUM PHOSPHATE 10 MG/ML IJ SOLN
INTRAMUSCULAR | Status: AC
  Filled 2022-12-23: qty 1

## 2022-12-23 MED ORDER — ROCURONIUM BROMIDE 10 MG/ML (PF) SYRINGE
PREFILLED_SYRINGE | INTRAVENOUS | Status: AC
  Filled 2022-12-23: qty 10

## 2022-12-23 MED ORDER — PROPOFOL 10 MG/ML IV BOLUS
INTRAVENOUS | Status: DC | PRN
  Administered 2022-12-23: 50 mg via INTRAVENOUS
  Administered 2022-12-23: 100 mg via INTRAVENOUS

## 2022-12-23 MED ORDER — ACETAMINOPHEN 500 MG PO TABS
1000.0000 mg | ORAL_TABLET | Freq: Once | ORAL | Status: DC
  Filled 2022-12-23: qty 2

## 2022-12-23 MED ORDER — POLYETHYLENE GLYCOL 3350 17 G PO PACK
17.0000 g | PACK | Freq: Every day | ORAL | Status: DC | PRN
  Filled 2022-12-23: qty 1

## 2022-12-23 MED ORDER — SODIUM CHLORIDE 0.9 % IR SOLN
Status: DC | PRN
  Administered 2022-12-23: 1000 mL

## 2022-12-23 MED ORDER — PANTOPRAZOLE SODIUM 40 MG PO TBEC
40.0000 mg | DELAYED_RELEASE_TABLET | Freq: Every day | ORAL | Status: DC
  Administered 2022-12-23 – 2022-12-26 (×4): 40 mg via ORAL
  Filled 2022-12-23 (×4): qty 1

## 2022-12-23 MED ORDER — SENNA 8.6 MG PO TABS
1.0000 | ORAL_TABLET | Freq: Two times a day (BID) | ORAL | Status: DC
  Administered 2022-12-23 – 2022-12-26 (×7): 8.6 mg via ORAL
  Filled 2022-12-23 (×7): qty 1

## 2022-12-23 MED ORDER — KETOROLAC TROMETHAMINE 30 MG/ML IJ SOLN
INTRAMUSCULAR | Status: AC
  Filled 2022-12-23: qty 1

## 2022-12-23 MED ORDER — HYDROMORPHONE HCL 1 MG/ML IJ SOLN
INTRAMUSCULAR | Status: DC | PRN
Start: 2022-12-23 — End: 2022-12-23
  Administered 2022-12-23: .5 mg via INTRAVENOUS

## 2022-12-23 MED ORDER — DIPHENHYDRAMINE HCL 12.5 MG/5ML PO ELIX: 12.5000 mg | ORAL_SOLUTION | ORAL | Status: DC | PRN

## 2022-12-23 MED ORDER — BUPIVACAINE-EPINEPHRINE (PF) 0.5% -1:200000 IJ SOLN
INTRAMUSCULAR | Status: AC
  Filled 2022-12-23: qty 30

## 2022-12-23 MED ORDER — OXYCODONE HCL 5 MG/5ML PO SOLN: 5.0000 mg | Freq: Once | ORAL | Status: DC | PRN

## 2022-12-23 MED ORDER — ACETAMINOPHEN 325 MG PO TABS: 325.0000 mg | ORAL_TABLET | Freq: Four times a day (QID) | ORAL | Status: DC | PRN

## 2022-12-23 MED ORDER — LIDOCAINE 2% (20 MG/ML) 5 ML SYRINGE
INTRAMUSCULAR | Status: DC | PRN
  Administered 2022-12-23: 100 mg via INTRAVENOUS

## 2022-12-23 MED ORDER — ONDANSETRON HCL 4 MG/2ML IJ SOLN
INTRAMUSCULAR | Status: DC | PRN
  Administered 2022-12-23: 4 mg via INTRAVENOUS

## 2022-12-23 MED ORDER — METHOCARBAMOL 1000 MG/10ML IJ SOLN
500.0000 mg | Freq: Four times a day (QID) | INTRAVENOUS | Status: DC | PRN
  Filled 2022-12-23: qty 5

## 2022-12-23 SURGICAL SUPPLY — 61 items
ADH SKN CLS APL DERMABOND .7 (GAUZE/BANDAGES/DRESSINGS) ×1
ALCOHOL 70% 16 OZ (MISCELLANEOUS) ×1 IMPLANT
APL PRP STRL LF DISP 70% ISPRP (MISCELLANEOUS) ×1
BAG COUNTER SPONGE SURGICOUNT (BAG) ×1 IMPLANT
BAG SPNG CNTER NS LX DISP (BAG) ×1
BLADE CLIPPER SURG (BLADE) IMPLANT
CHLORAPREP W/TINT 26 (MISCELLANEOUS) ×1 IMPLANT
COVER SURGICAL LIGHT HANDLE (MISCELLANEOUS) ×1 IMPLANT
DERMABOND ADVANCED .7 DNX12 (GAUZE/BANDAGES/DRESSINGS) ×2 IMPLANT
DRAPE 3/4 80X56 (DRAPES) ×3 IMPLANT
DRAPE C-ARM 42X72 X-RAY (DRAPES) ×1 IMPLANT
DRAPE STERI IOBAN 125X83 (DRAPES) ×1 IMPLANT
DRAPE U-SHAPE 47X51 STRL (DRAPES) ×3 IMPLANT
DRSG AQUACEL AG ADV 3.5X10 (GAUZE/BANDAGES/DRESSINGS) ×1 IMPLANT
ELECT BLADE 4.0 EZ CLEAN MEGAD (MISCELLANEOUS) ×1
ELECT PENCIL ROCKER SW 15FT (MISCELLANEOUS) ×1 IMPLANT
ELECT REM PT RETURN 9FT ADLT (ELECTROSURGICAL) ×1
ELECTRODE BLDE 4.0 EZ CLN MEGD (MISCELLANEOUS) ×1 IMPLANT
ELECTRODE REM PT RTRN 9FT ADLT (ELECTROSURGICAL) ×1 IMPLANT
EVACUATOR 1/8 PVC DRAIN (DRAIN) IMPLANT
GLOVE BIO SURGEON STRL SZ7.5 (GLOVE) ×1 IMPLANT
GLOVE BIO SURGEON STRL SZ8.5 (GLOVE) ×2 IMPLANT
GLOVE BIOGEL PI IND STRL 7.5 (GLOVE) ×1 IMPLANT
GLOVE BIOGEL PI IND STRL 8.5 (GLOVE) ×1 IMPLANT
GOWN STRL REUS W/ TWL XL LVL3 (GOWN DISPOSABLE) ×1 IMPLANT
GOWN STRL REUS W/TWL 2XL LVL3 (GOWN DISPOSABLE) ×1 IMPLANT
GOWN STRL REUS W/TWL XL LVL3 (GOWN DISPOSABLE) ×1
HANDPIECE INTERPULSE COAX TIP (DISPOSABLE) ×1
HEAD FEM +3XOFST 36XMDLR (Orthopedic Implant) IMPLANT
HEAD MODULAR 36MM (Orthopedic Implant) ×1 IMPLANT
HOOD PEEL AWAY T7 (MISCELLANEOUS) ×2 IMPLANT
KIT BASIN OR (CUSTOM PROCEDURE TRAY) ×1 IMPLANT
KIT TURNOVER KIT B (KITS) ×1 IMPLANT
LINER ACETAB OFF G7 5 36 +5 (Liner) IMPLANT
MANIFOLD NEPTUNE II (INSTRUMENTS) ×1 IMPLANT
MARKER SKIN DUAL TIP RULER LAB (MISCELLANEOUS) ×2 IMPLANT
NDL SPNL 18GX3.5 QUINCKE PK (NEEDLE) ×1 IMPLANT
NEEDLE SPNL 18GX3.5 QUINCKE PK (NEEDLE) ×1 IMPLANT
NS IRRIG 1000ML POUR BTL (IV SOLUTION) ×1 IMPLANT
PACK ANTERIOR HIP CUSTOM (KITS) ×1 IMPLANT
PAD ARMBOARD 7.5X6 YLW CONV (MISCELLANEOUS) ×2 IMPLANT
SAW OSC TIP CART 19.5X105X1.3 (SAW) ×1 IMPLANT
SEALER BIPOLAR AQUA 6.0 (INSTRUMENTS) IMPLANT
SET HNDPC FAN SPRY TIP SCT (DISPOSABLE) ×1 IMPLANT
SHELL ACET G7 4H 58 SZG HIP (Shell) IMPLANT
SOLUTION PRONTOSAN WOUND 350ML (IRRIGATION / IRRIGATOR) ×1 IMPLANT
STAPLER VISISTAT 35W (STAPLE) IMPLANT
STEM FEMORAL 17X154 (Stem) IMPLANT
SUT MNCRL AB 3-0 PS2 18 (SUTURE) IMPLANT
SUT MON AB 2-0 CT1 36 (SUTURE) ×1 IMPLANT
SUT VIC AB 2-0 CT1 27 (SUTURE) ×1
SUT VIC AB 2-0 CT1 TAPERPNT 27 (SUTURE) ×1 IMPLANT
SUT VLOC 180 0 24IN GS25 (SUTURE) ×1 IMPLANT
SYR 50ML LL SCALE MARK (SYRINGE) ×1 IMPLANT
TOWEL GREEN STERILE (TOWEL DISPOSABLE) ×1 IMPLANT
TOWEL GREEN STERILE FF (TOWEL DISPOSABLE) ×1 IMPLANT
TRAY CATH INTERMITTENT SS 16FR (CATHETERS) IMPLANT
TRAY FOLEY W/BAG SLVR 16FR (SET/KITS/TRAYS/PACK)
TRAY FOLEY W/BAG SLVR 16FR ST (SET/KITS/TRAYS/PACK) IMPLANT
TUBE SUCTION HIGH CAP CLEAR NV (SUCTIONS) ×1 IMPLANT
WATER STERILE IRR 1000ML POUR (IV SOLUTION) ×3 IMPLANT

## 2022-12-23 NOTE — Discharge Instructions (Signed)
? ?Dr. Brian Swinteck ?Joint Replacement Specialist ?[facility] ?[address] ?[place], [place] [address] ?[phone] ? ? ?TOTAL HIP REPLACEMENT POSTOPERATIVE DIRECTIONS ? ? ? ?Hip Rehabilitation, Guidelines Following Surgery  ? ?WEIGHT BEARING ?Weight bearing as tolerated with assist device (walker, cane, etc) as directed, use it as long as suggested by your surgeon or therapist, typically at least 4-6 weeks. ? ?The results of a hip operation are greatly improved after range of motion and muscle strengthening exercises. Follow all safety measures which are given to protect your hip. If any of these exercises cause increased pain or swelling in your joint, decrease the amount until you are comfortable again. Then slowly increase the exercises. Call your caregiver if you have problems or questions.  ? ?HOME CARE INSTRUCTIONS  ?Most of the following instructions are designed to prevent the dislocation of your new hip.  ?Remove items at home which could result in a fall. This includes throw rugs or furniture in walking pathways.  ?Continue medications as instructed at time of discharge. ?You may have some home medications which will be placed on hold until you complete the course of blood thinner medication. ?You may start showering once you are discharged home. Do not remove your dressing. ?Do not put on socks or shoes without following the instructions of your caregivers.   ?Sit on chairs with arms. Use the chair arms to help push yourself up when arising.  ?Arrange for the use of a toilet seat elevator so you are not sitting low.  ?Walk with walker as instructed.  ?You may resume a sexual relationship in one month or when given the OK by your caregiver.  ?Use walker as long as suggested by your caregivers.  ?You may put full weight on your legs and walk as much as is comfortable. ?Avoid periods of inactivity such as sitting longer than an hour when not asleep. This helps prevent blood  clots.  ?You may return to work once you are cleared by your surgeon.  ?Do not drive a car for 6 weeks or until released by your surgeon.  ?Do not drive while taking narcotics.  ?Wear elastic stockings for two weeks following surgery during the day but you may remove then at night.  ?Make sure you keep all of your appointments after your operation with all of your doctors and caregivers. You should call the office at the above phone number and make an appointment for approximately two weeks after the date of your surgery. ?Please pick up a stool softener and laxative for home use as long as you are requiring pain medications. ?ICE to the affected hip every three hours for 30 minutes at a time and then as needed for pain and swelling. Continue to use ice on the hip for pain and swelling from surgery. You may notice swelling that will progress down to the foot and ankle.  This is normal after surgery.  Elevate the leg when you are not up walking on it.   ?It is important for you to complete the blood thinner medication as prescribed by your doctor. ?Continue to use the breathing machine which will help keep your temperature down.  It is common for your temperature to cycle up and down following surgery, especially at night when you are not up moving around and exerting yourself.  The breathing machine keeps your lungs expanded and your temperature down. ? ?RANGE OF MOTION AND STRENGTHENING EXERCISES  ?These exercises are designed to help you   keep full movement of your hip joint. Follow your caregiver's or physical therapist's instructions. Perform all exercises about fifteen times, three times per day or as directed. Exercise both hips, even if you have had only one joint replacement. These exercises can be done on a training (exercise) mat, on the floor, on a table or on a bed. Use whatever works the best and is most comfortable for you. Use music or television while you are exercising so that the exercises are a  pleasant break in your day. This will make your life better with the exercises acting as a break in routine you can look forward to.  ?Lying on your back, slowly slide your foot toward your buttocks, raising your knee up off the floor. Then slowly slide your foot back down until your leg is straight again.  ?Lying on your back spread your legs as far apart as you can without causing discomfort.  ?Lying on your side, raise your upper leg and foot straight up from the floor as far as is comfortable. Slowly lower the leg and repeat.  ?Lying on your back, tighten up the muscle in the front of your thigh (quadriceps muscles). You can do this by keeping your leg straight and trying to raise your heel off the floor. This helps strengthen the largest muscle supporting your knee.  ?Lying on your back, tighten up the muscles of your buttocks both with the legs straight and with the knee bent at a comfortable angle while keeping your heel on the floor.  ? ?SKILLED REHAB INSTRUCTIONS: ?If the patient is transferred to a skilled rehab facility following release from the hospital, a list of the current medications will be sent to the facility for the patient to continue.  When discharged from the skilled rehab facility, please have the facility set up the patient's Home Health Physical Therapy prior to being released. Also, the skilled facility will be responsible for providing the patient with their medications at time of release from the facility to include their pain medication and their blood thinner medication. If the patient is still at the rehab facility at time of the two week follow up appointment, the skilled rehab facility will also need to assist the patient in arranging follow up appointment in our office and any transportation needs. ? ?POST-OPERATIVE OPIOID TAPER INSTRUCTIONS: ?It is important to wean off of your opioid medication as soon as possible. If you do not need pain medication after your surgery it is ok  to stop day one. ?Opioids include: ?Codeine, Hydrocodone(Norco, Vicodin), Oxycodone(Percocet, oxycontin) and hydromorphone amongst others.  ?Long term and even short term use of opiods can cause: ?Increased pain response ?Dependence ?Constipation ?Depression ?Respiratory depression ?And more.  ?Withdrawal symptoms can include ?Flu like symptoms ?Nausea, vomiting ?And more ?Techniques to manage these symptoms ?Hydrate well ?Eat regular healthy meals ?Stay active ?Use relaxation techniques(deep breathing, meditating, yoga) ?Do Not substitute Alcohol to help with tapering ?If you have been on opioids for less than two weeks and do not have pain than it is ok to stop all together.  ?Plan to wean off of opioids ?This plan should start within one week post op of your joint replacement. ?Maintain the same interval or time between taking each dose and first decrease the dose.  ?Cut the total daily intake of opioids by one tablet each day ?Next start to increase the time between doses. ?The last dose that should be eliminated is the evening dose.  ? ? ?MAKE   SURE YOU:  ?Understand these instructions.  ?Will watch your condition.  ?Will get help right away if you are not doing well or get worse. ? ?Pick up stool softner and laxative for home use following surgery while on pain medications. ?Do not remove your dressing. ?The dressing is waterproof--it is OK to take showers. ?Continue to use ice for pain and swelling after surgery. ?Do not use any lotions or creams on the incision until instructed by your surgeon. ?Total Hip Protocol. ? ?

## 2022-12-23 NOTE — Anesthesia Procedure Notes (Signed)
Procedure Name: Intubation Date/Time: 12/23/2022 7:53 AM  Performed by: Alwyn Ren, CRNAPre-anesthesia Checklist: Patient identified, Emergency Drugs available, Suction available and Patient being monitored Patient Re-evaluated:Patient Re-evaluated prior to induction Oxygen Delivery Method: Circle system utilized Preoxygenation: Pre-oxygenation with 100% oxygen Induction Type: IV induction Ventilation: Mask ventilation without difficulty Laryngoscope Size: Miller and 2 Grade View: Grade I Tube type: Oral Tube size: 7.5 mm Number of attempts: 1 Airway Equipment and Method: Stylet and Oral airway Placement Confirmation: ETT inserted through vocal cords under direct vision, positive ETCO2 and breath sounds checked- equal and bilateral Tube secured with: Tape Dental Injury: Teeth and Oropharynx as per pre-operative assessment

## 2022-12-23 NOTE — Interval H&P Note (Signed)
History and Physical Interval Note:  12/23/2022 7:25 AM  [place]  has presented today for surgery, with the diagnosis of left hip fracture.  The various methods of treatment have been discussed with the patient and family. After consideration of risks, benefits and other options for treatment, the patient has consented to  Procedure(s): TOTAL HIP ARTHROPLASTY ANTERIOR APPROACH (Left) as a surgical intervention.  The patient's history has been reviewed, patient examined, no change in status, stable for surgery.  I have reviewed the patient's chart and labs.  Questions were answered to the patient's satisfaction.    The risks, benefits, and alternatives were discussed with the patient. There are risks associated with the surgery including, but not limited to, problems with anesthesia (death), infection, instability (giving out of the joint), dislocation, differences in leg length/angulation/rotation, fracture of bones, loosening or failure of implants, hematoma (blood accumulation) which may require surgical drainage, blood clots, pulmonary embolism, nerve injury (foot drop and lateral thigh numbness), and blood vessel injury. The patient understands these risks and elects to proceed.    Iline Oven Ransom Nickson

## 2022-12-23 NOTE — Op Note (Signed)
OPERATIVE REPORT  SURGEON: Samson Frederic, MD   ASSISTANT: Clint Bolder, PA-C  PREOPERATIVE DIAGNOSIS: Displaced Left femoral neck fracture.   POSTOPERATIVE DIAGNOSIS: Displaced Left femoral neck fracture.   PROCEDURE: Left total hip arthroplasty, anterior approach.   IMPLANTS: Biomet Taperloc Reduced Distal stem, size 17 x 154 mm, high offset. Biomet G7 OsseoTi Cup, size 58 mm. Biomet Vivacit-E liner, size 36 mm, G, +5 mm neutral. Biomet metal head ball, size 36 + 3 mm.  ANESTHESIA:  General  ANTIBIOTICS: 2g ancef.  ESTIMATED BLOOD LOSS:-250 mL    DRAINS: None.  COMPLICATIONS: None   CONDITION: PACU - hemodynamically stable.   BRIEF CLINICAL NOTE: [place] is a 82 y.o. male with a displaced Left femoral neck fracture. The patient was admitted to the hospitalist service and underwent perioperative risk stratification and medical optimization. The risks, benefits, and alternatives to total hip arthroplasty were explained, and the patient elected to proceed.  PROCEDURE IN DETAIL: The patient was taken to the operating room and general anesthesia was induced on the hospital bed.  The patient was then positioned on the Hana table.  All bony prominences were well padded.  The hip was prepped and draped in the normal sterile surgical fashion.  A time-out was called verifying side and site of surgery. Antibiotics were given within 60 minutes of beginning the procedure.   Bikini incision was made, and the direct anterior approach to the hip was performed through the Hueter interval.  Lateral femoral circumflex vessels were treated with the Auqumantys. The anterior capsule was exposed and an inverted T capsulotomy was made.  Fracture hematoma was encountered and evacuated. The patient was found to have a comminuted Left subcapital femoral neck fracture.  I freshened the femoral neck cut with a saw.  I removed the femoral neck fragment.  A corkscrew was placed into the head and the head  was removed.  This was passed to the back table and was measured. The pubofemoral ligament was released subperiosteally to the lesser trochanter.  Acetabular exposure was achieved, and the pulvinar and labrum were excised. Sequential reaming of the acetabulum was then performed up to a size 57 mm reamer under direct visulization. A 58 mm cup was then opened and impacted into place at approximately 40 degrees of abduction and 20 degrees of anteversion. The final polyethylene liner was impacted into place and acetabular osteophytes were removed.    I then gained femoral exposure taking care to protect the abductors and greater trochanter.  This was performed using standard external rotation, extension, and adduction.  A cookie cutter was used to enter the femoral canal, and then the femoral canal finder was placed.  Sequential broaching was performed up to a size 17.  Calcar planer was used on the femoral neck remnant.  I placed a high offset neck and a trial head ball.  The hip was reduced.  Leg lengths and offset were checked fluoroscopically.  The hip was dislocated and trial components were removed.  The final implants were placed, and the hip was reduced.  Fluoroscopy was used to confirm component position and leg lengths.  At 90 degrees of external rotation and full extension, the hip was stable to an anterior directed force.   The wound was copiously irrigated with Prontosan solution and normal saline using pulse lavage.  Marcaine solution was injected into the periarticular soft tissue.  The wound was closed in layers using #1 Stratafix for the fascia, 2-0 Vicryl for the subcutaneous fat,  2-0 Monocryl for the deep dermal layer, and staples + Dermabond for the skin.  Once the glue was fully dried, an Aquacell Ag dressing was applied.  The patient was transported to the recovery room in stable condition.  Sponge, needle, and instrument counts were correct at the end of the case x2.  The patient tolerated  the procedure well and there were no known complications.  The aquamantis was utilized for this case to help facilitate better hemostasis as patient was felt to be at increased risk of bleeding because of preop anemia.  A oscillating saw tip was utilized for this case to prevent damage to the soft tissue structures such as muscles, ligaments and tendons, and to ensure accurate bone cuts. This patient was at increased risk for above structures due to  minimally invasive approach.  Please note that a surgical assistant was a medical necessity for this procedure to perform it in a safe and expeditious manner. Assistant was necessary to provide appropriate retraction of vital neurovascular structures, to prevent femoral fracture, and to allow for anatomic placement of the prosthesis.

## 2022-12-23 NOTE — Transfer of Care (Signed)
Immediate Anesthesia Transfer of Care Note  Patient: [place]  Procedure(s) Performed: TOTAL HIP ARTHROPLASTY ANTERIOR APPROACH (Left: Hip)  Patient Location: PACU  Anesthesia Type:General  Level of Consciousness: awake  Airway & Oxygen Therapy: Patient Spontanous Breathing and Patient connected to face mask oxygen  Post-op Assessment: Report given to RN and Post -op Vital signs reviewed and stable  Post vital signs: Reviewed and stable  Last Vitals:  Vitals Value Taken Time  BP 112/71 12/23/22 0946  Temp    Pulse 108 12/23/22 0949  Resp 18 12/23/22 0949  SpO2 90 % 12/23/22 0949  Vitals shown include unfiled device data.  Last Pain:  Vitals:   12/23/22 0624  TempSrc: Oral  PainSc:          Complications: No notable events documented.

## 2022-12-23 NOTE — Progress Notes (Signed)
[facility] PROGRESS NOTE   Dandrea Streng Flow UYQ:[id number] DOB: 11-04-1940 DOA: 12/20/2022  PCP: Galvin Proffer, MD  Brief History/Interval Summary: 82 year old male with past medical history of diabetes mellitus type 2, essential hypertension presented after sustaining a mechanical fall at home.  Was found to have left hip fracture.  Was hospitalized for further management.  Consultants: Orthopedics  Procedures: None yet    Subjective/Interval History: Patient to go to the OR later today.  Complains of pain in the hip area.  No chest pain shortness of breath.  No nausea or vomiting.    Assessment/Plan:  Left hip fracture Seen by orthopedics.  There was unavailability of orthopedics and anesthesia yesterday so he could not undergo surgery.  Initially plan was to transfer him to another hospital but then subsequently availability was confirmed for today.  Patient to go to the OR later today.  Low-grade fever noted this morning.  Will recheck temperatures.  No obvious source of infection identified.  WBC is normal.  Could be from his fracture.  He denies any new symptoms.    Diabetes mellitus type 2 On basal insulin at home.  Monitor CBGs.  Continue with glargine and SSI.  May need dose adjustments.   HbA1c 9.7.  Essential hypertension Holding lisinopril and HCTZ for now.  Monitor blood pressures.  High blood pressure readings are likely due to pain issues.  Acute kidney injury Mild increase in creatinine was noted.  Noted to be 1.5 yesterday.  Was given IV fluids with improvement in renal function today.  Monitor urine output.  Recheck labs tomorrow.  Hypokalemia Was supplemented but again noted to be low today.  Will order IV potassium.  Recheck labs tomorrow.    Normocytic anemia Stable for the most part.  History of BPH Continue with tamsulosin and Proscar.  DVT Prophylaxis: Definitive prophylaxis after surgery.  SCDs for now Code Status: Full code Family  Communication: Discussed with patient and his with daughter Disposition Plan: To be determined    Medications: Scheduled:  acetaminophen  1,000 mg Oral Once   [MAR Hold] cephALEXin  500 mg Oral Q8H   chlorhexidine  60 mL Topical Once   chlorhexidine       [MAR Hold] finasteride  5 mg Oral QHS   [MAR Hold] gabapentin  600 mg Oral BID   [MAR Hold] insulin aspart  0-15 Units Subcutaneous Q4H   [MAR Hold] insulin glargine-yfgn  5 Units Subcutaneous Daily   [MAR Hold] mupirocin ointment  1 Application Nasal BID   [MAR Hold] ondansetron (ZOFRAN) IV  4 mg Intravenous Once   povidone-iodine  2 Application Topical Once   [MAR Hold] tamsulosin  0.4 mg Oral QPC supper   Continuous:  [MAR Hold] potassium chloride     [MAR Hold] promethazine (PHENERGAN) injection (IM or IVPB)     PRN:[MAR Hold] acetaminophen **OR** [MAR Hold] acetaminophen, [MAR Hold] camphor-menthol, chlorhexidine, [MAR Hold] fentaNYL (SUBLIMAZE) injection, [MAR Hold] melatonin, [MAR Hold] naLOXone (NARCAN)  injection, [MAR Hold] ondansetron (ZOFRAN) IV, [MAR Hold] promethazine (PHENERGAN) injection (IM or IVPB)  Antibiotics: Anti-infectives (From admission, onward)    Start     Dose/Rate Route Frequency Ordered Stop   12/23/22 0718  ceFAZolin (ANCEF) 2-4 GM/100ML-% IVPB  Status:  Discontinued       Note to Pharmacy: [facility], [place]: cabinet override      12/23/22 0718 12/23/22 0722   12/23/22 0600  ceFAZolin (ANCEF) IVPB 2g/100 mL premix  2 g 200 mL/hr over 30 Minutes Intravenous On call to O.R. 12/22/22 1941 12/23/22 0806   12/22/22 0915  [MAR Hold]  cephALEXin (KEFLEX) capsule 500 mg        (MAR Hold since Fri 12/23/2022 at 0723.Hold Reason: Transfer to a Procedural area)   500 mg Oral Every 8 hours 12/22/22 0826 12/26/22 0559   12/21/22 1230  sulfamethoxazole-trimethoprim (BACTRIM DS) 800-160 MG per tablet 1 tablet  Status:  Discontinued       Note to Pharmacy: For 7 days     1 tablet Oral 2 times daily 12/21/22  1136 12/22/22 0826       Objective:  Vital Signs  Vitals:   12/22/22 1229 12/22/22 2012 12/23/22 0355 12/23/22 0624  BP: (!) 150/87 (!) 151/81  (!) 157/86  Pulse: 88 86  78  Resp: 14 17    Temp: 98.2 F (36.8 C) 99.5 F (37.5 C)  (!) 100.7 F (38.2 C)  TempSrc: Oral Oral  Oral  SpO2: 94% 92%  95%  Weight:   97.6 kg     Intake/Output Summary (Last 24 hours) at 12/23/2022 0950 Last data filed at 12/23/2022 0917 Gross per 24 hour  Intake 2180.9 ml  Output 450 ml  Net 1730.9 ml   Filed Weights   12/22/22 0500 12/23/22 0355  Weight: 95.4 kg 97.6 kg    General appearance: Awake alert.  In no distress Resp: Clear to auscultation bilaterally.  Normal effort Cardio: S1-S2 is normal regular.  No S3-S4.  No rubs murmurs or bruit GI: Abdomen is soft.  Nontender nondistended.  Bowel sounds are present normal.  No masses organomegaly   Lab Results:  Data Reviewed: I have personally reviewed following labs and reports of the imaging studies  CBC: Recent Labs  Lab 12/21/22 0000 12/21/22 0409 12/22/22 0647 12/23/22 0339  WBC 15.4* 12.6* 10.1 9.2  NEUTROABS 11.1* 10.7*  --   --   HGB 12.9* 12.7* 12.6* 12.0*  HCT 36.7* 36.5* 36.3* 35.5*  MCV 90.4 89.0 91.9 89.6  PLT 230 212 187 190    Basic Metabolic Panel: Recent Labs  Lab 12/21/22 0000 12/21/22 0409 12/22/22 0647 12/23/22 0339  NA 138 136 137 134*  K 2.9* 3.4* 3.6 3.1*  CL 104 104 102 103  CO2 20* 23 25 23   GLUCOSE 186* 293* 208* 152*  BUN 14 14 16 15   CREATININE 1.41* 1.36* 1.50* 1.30*  CALCIUM 8.8* 8.3* 8.4* 7.8*  MG 1.7 1.8  --   --     GFR: CrCl cannot be calculated (Unknown ideal weight.).  Liver Function Tests: Recent Labs  Lab 12/21/22 0409  AST 16  ALT 17  ALKPHOS 54  BILITOT 1.4*  PROT 6.5  ALBUMIN 3.7     Coagulation Profile: Recent Labs  Lab 12/21/22 0409  INR 1.1     CBG: Recent Labs  Lab 12/22/22 2033 12/22/22 2357 12/23/22 0340 12/23/22 0614 12/23/22 0849  GLUCAP  166* 202* 159* 134* 199*     Radiology Studies: DG C-Arm 1-60 Min-No Report  Result Date: 12/23/2022 Fluoroscopy was utilized by the requesting physician.  No radiographic interpretation.   DG C-Arm 1-60 Min-No Report  Result Date: 12/23/2022 Fluoroscopy was utilized by the requesting physician.  No radiographic interpretation.       LOS: 3 days   Oreoluwa Gilmer [employer] on [email or web address]  12/23/2022, 9:50 AM

## 2022-12-23 NOTE — Anesthesia Postprocedure Evaluation (Signed)
Anesthesia Post Note  Patient: [place]  Procedure(s) Performed: TOTAL HIP ARTHROPLASTY ANTERIOR APPROACH (Left: Hip)     Patient location during evaluation: PACU Anesthesia Type: General Level of consciousness: awake Pain management: pain level controlled Vital Signs Assessment: post-procedure vital signs reviewed and stable Respiratory status: spontaneous breathing, nonlabored ventilation and respiratory function stable Cardiovascular status: blood pressure returned to baseline and stable Postop Assessment: no apparent nausea or vomiting Anesthetic complications: no   No notable events documented.  Last Vitals:  Vitals:   12/23/22 1030 12/23/22 1103  BP: 125/74 126/74  Pulse: 97 96  Resp: 17 19  Temp: 36.7 C 36.8 C  SpO2: 93% 90%    Last Pain:  Vitals:   12/23/22 1103  TempSrc: Oral  PainSc:                  Linton Rump

## 2022-12-24 ENCOUNTER — Encounter (HOSPITAL_COMMUNITY): Payer: Self-pay | Admitting: Orthopedic Surgery

## 2022-12-24 DIAGNOSIS — S72002A Fracture of unspecified part of neck of left femur, initial encounter for closed fracture: Secondary | ICD-10-CM | POA: Diagnosis not present

## 2022-12-24 DIAGNOSIS — Z794 Long term (current) use of insulin: Secondary | ICD-10-CM | POA: Diagnosis not present

## 2022-12-24 DIAGNOSIS — E119 Type 2 diabetes mellitus without complications: Secondary | ICD-10-CM | POA: Diagnosis not present

## 2022-12-24 DIAGNOSIS — I10 Essential (primary) hypertension: Secondary | ICD-10-CM | POA: Diagnosis not present

## 2022-12-24 LAB — BASIC METABOLIC PANEL
Anion gap: 9 (ref 5–15)
BUN: 19 mg/dL (ref 8–23)
CO2: 21 mmol/L — ABNORMAL LOW (ref 22–32)
Calcium: 7.6 mg/dL — ABNORMAL LOW (ref 8.9–10.3)
Chloride: 104 mmol/L (ref 98–111)
Creatinine, Ser: 1.4 mg/dL — ABNORMAL HIGH (ref 0.61–1.24)
GFR, Estimated: 50 mL/min — ABNORMAL LOW (ref >60)
Glucose, Bld: 184 mg/dL — ABNORMAL HIGH (ref 70–99)
Potassium: 3.3 mmol/L — ABNORMAL LOW (ref 3.5–5.1)
Sodium: 134 mmol/L — ABNORMAL LOW (ref 135–145)

## 2022-12-24 LAB — MAGNESIUM: Magnesium: 1.8 mg/dL (ref 1.7–2.4)

## 2022-12-24 LAB — CBC
HCT: 31.6 % — ABNORMAL LOW (ref 39.0–52.0)
Hemoglobin: 10.6 g/dL — ABNORMAL LOW (ref 13.0–17.0)
MCH: 30.7 pg (ref 26.0–34.0)
MCHC: 33.5 g/dL (ref 30.0–36.0)
MCV: 91.6 fL (ref 80.0–100.0)
Platelets: 164 10*3/uL (ref 150–400)
RBC: 3.45 MIL/uL — ABNORMAL LOW (ref 4.22–5.81)
RDW: 12.9 % (ref 11.5–15.5)
WBC: 10.3 10*3/uL (ref 4.0–10.5)
nRBC: 0 % (ref 0.0–0.2)

## 2022-12-24 LAB — GLUCOSE, CAPILLARY
Glucose-Capillary: 184 mg/dL — ABNORMAL HIGH (ref 70–99)
Glucose-Capillary: 210 mg/dL — ABNORMAL HIGH (ref 70–99)
Glucose-Capillary: 217 mg/dL — ABNORMAL HIGH (ref 70–99)
Glucose-Capillary: 233 mg/dL — ABNORMAL HIGH (ref 70–99)
Glucose-Capillary: 235 mg/dL — ABNORMAL HIGH (ref 70–99)
Glucose-Capillary: 244 mg/dL — ABNORMAL HIGH (ref 70–99)

## 2022-12-24 MED ORDER — POTASSIUM CHLORIDE CRYS ER 20 MEQ PO TBCR
40.0000 meq | EXTENDED_RELEASE_TABLET | Freq: Two times a day (BID) | ORAL | Status: AC
  Administered 2022-12-24 (×2): 40 meq via ORAL
  Filled 2022-12-24 (×2): qty 2

## 2022-12-24 NOTE — Progress Notes (Signed)
   Subjective: 1 Day Post-Op Procedure(s) (LRB): TOTAL HIP ARTHROPLASTY ANTERIOR APPROACH (Left)  Pt c/o mild soreness but overall doing well Denies any new symptoms overnight Ready to start physical therapy today Patient reports pain as mild.  Objective:   VITALS:   Vitals:   12/24/22 0550 12/24/22 0741  BP: (!) 142/75 137/72  Pulse: (!) 104 94  Resp: 16 18  Temp: 98.9 F (37.2 C) 98.4 F (36.9 C)  SpO2: 93% 93%    Left hip incision healing well Dressing in place No signs of drainage or erythema Nv intact distally   LABS Recent Labs    12/22/22 0647 12/23/22 0339 12/24/22 0407  HGB 12.6* 12.0* 10.6*  HCT 36.3* 35.5* 31.6*  WBC 10.1 9.2 10.3  PLT 187 190 164    Recent Labs    12/22/22 0647 12/23/22 0339 12/24/22 0407  NA 137 134* 134*  K 3.6 3.1* 3.3*  BUN 16 15 19   CREATININE 1.50* 1.30* 1.40*  GLUCOSE 208* 152* 184*     Assessment/Plan: 1 Day Post-Op Procedure(s) (LRB): TOTAL HIP ARTHROPLASTY ANTERIOR APPROACH (Left) Continue PT/OT, weight bearing as tolerated Pain management as needed Pulmonary toilet D/c planning     Brad Antonieta Iba, MPAS [facility] is now [employer]  [place] [address], [place], [place] [address] Phone: [phone] [email or web address] Facebook  [employer]

## 2022-12-24 NOTE — Evaluation (Signed)
Physical Therapy Evaluation Patient Details Name: TRAETON EGLER MRN: [id number] DOB: 1940-12-27 Today's Date: 12/24/2022  History of Present Illness  Pt is an 82 y/o M presenting to EDOn 9/10 after fall at home, imaging revealing L femoral neck fx. S/p L THA On 9/13. PMH includes DM2 and essential HTN.  Clinical Impression  Pt presents with admitting diagnosis above. Co-treat with OT. Pt today was able to ambulate in hallway with RW CGA with a chair follow however did require Mod A for bed mobility and +2 Min A to stand from an elevated surface. Despite this, recommend HHPT upon DC. PT will continue to follow.        If plan is discharge home, recommend the following: A little help with bathing/dressing/bathroom;A little help with walking and/or transfers;Assistance with cooking/housework;Assist for transportation;Help with stairs or ramp for entrance   Can travel by private vehicle        Equipment Recommendations None recommended by PT  Recommendations for Other Services       Functional Status Assessment Patient has had a recent decline in their functional status and demonstrates the ability to make significant improvements in function in a reasonable and predictable amount of time.     Precautions / Restrictions Precautions Precautions: Fall Restrictions Weight Bearing Restrictions: Yes LLE Weight Bearing: Weight bearing as tolerated      Mobility  Bed Mobility Overal bed mobility: Needs Assistance Bed Mobility: Supine to Sit     Supine to sit: Mod assist          Transfers Overall transfer level: Needs assistance Equipment used: Rolling walker (2 wheels) Transfers: Sit to/from Stand Sit to Stand: Min assist, +2 physical assistance                Ambulation/Gait Ambulation/Gait assistance: Contact guard assist, +2 safety/equipment Gait Distance (Feet): 75 Feet Assistive device: Rolling walker (2 wheels) Gait Pattern/deviations: Decreased stride  length, Step-through pattern, Antalgic Gait velocity: decreased     General Gait Details: Chair follow for safety. no LOB noted. Pt with slowed antalgic gait pattern.  Stairs            Wheelchair Mobility     Tilt Bed    Modified Rankin (Stroke Patients Only)       Balance Overall balance assessment: Needs assistance Sitting-balance support: Feet supported Sitting balance-Leahy Scale: Good     Standing balance support: During functional activity, Reliant on assistive device for balance Standing balance-Leahy Scale: Poor Standing balance comment: RW in standing                             Pertinent Vitals/Pain Pain Assessment Pain Assessment: 0-10 Pain Score: 7  Faces Pain Scale: Hurts even more Pain Location: L hip Pain Descriptors / Indicators: Discomfort Pain Intervention(s): Premedicated before session, Monitored during session    Home Living Family/patient expects to be discharged to:: Private residence Living Arrangements: Spouse/significant other;Children Available Help at Discharge: Family;Available 24 hours/day Type of Home: House Home Access: Ramped entrance       Home Layout: One level Home Equipment: Cane - single Producer, television/film/video (2 wheels);Hand held shower head      Prior Function Prior Level of Function : Independent/Modified Independent;Driving             Mobility Comments: Ind ADLs Comments: Ind     Extremity/Trunk Assessment   Upper Extremity Assessment Upper Extremity Assessment: Overall WFL for tasks assessed  Lower Extremity Assessment Lower Extremity Assessment: LLE deficits/detail LLE Deficits / Details: THA    Cervical / Trunk Assessment Cervical / Trunk Assessment: Normal  Communication   Communication Communication: No apparent difficulties  Cognition Arousal: Alert Behavior During Therapy: WFL for tasks assessed/performed Overall Cognitive Status: Within Functional  Limits for tasks assessed                                          General Comments General comments (skin integrity, edema, etc.): BP: 132/85 seated after ambulation.    Exercises     Assessment/Plan    PT Assessment Patient needs continued PT services  PT Problem List Decreased strength;Decreased range of motion;Decreased activity tolerance;Decreased balance;Decreased mobility;Decreased coordination;Decreased knowledge of use of DME;Decreased safety awareness;Decreased knowledge of precautions;Pain       PT Treatment Interventions DME instruction;Gait training;Stair training;Functional mobility training;Therapeutic activities;Therapeutic exercise;Balance training;Neuromuscular re-education;Patient/family education    PT Goals (Current goals can be found in the Care Plan section)  Acute Rehab PT Goals Patient Stated Goal: to go home PT Goal Formulation: With patient Time For Goal Achievement: 01/07/23 Potential to Achieve Goals: Good    Frequency Min 1X/week     Co-evaluation               AM-PAC PT "6 Clicks" Mobility  Outcome Measure Help needed turning from your back to your side while in a flat bed without using bedrails?: A Little Help needed moving from lying on your back to sitting on the side of a flat bed without using bedrails?: A Lot Help needed moving to and from a bed to a chair (including a wheelchair)?: A Lot Help needed standing up from a chair using your arms (e.g., wheelchair or bedside chair)?: A Lot Help needed to walk in hospital room?: A Little Help needed climbing 3-5 steps with a railing? : A Lot 6 Click Score: 14    End of Session Equipment Utilized During Treatment: Gait belt Activity Tolerance: Patient tolerated treatment well Patient left: in chair;with call bell/phone within reach;with chair alarm set;with family/visitor present Nurse Communication: Mobility status PT Visit Diagnosis: Other abnormalities of gait and  mobility (R26.89)    Time: 1610-9604 PT Time Calculation (min) (ACUTE ONLY): 24 min   Charges:   PT Evaluation $PT Eval Moderate Complexity: 1 Mod   PT General Charges $$ ACUTE PT VISIT: 1 Visit         Shela Nevin, PT, DPT Acute Rehab Services [phone]   Gladys Damme 12/24/2022, 2:04 PM

## 2022-12-24 NOTE — Plan of Care (Signed)
Problem: Activity: Goal: Ability to avoid complications of mobility impairment will improve Outcome: Progressing   Problem: Pain Management: Goal: Pain level will decrease with appropriate interventions Outcome: Progressing   Problem: Skin Integrity: Goal: Will show signs of wound healing Outcome: Progressing

## 2022-12-24 NOTE — Evaluation (Signed)
Occupational Therapy Evaluation Patient Details Name: Luke Edwards MRN: [id number] DOB: 1941-01-01 Today's Date: 12/24/2022   History of Present Illness Pt is an 82 y/o M presenting to EDOn 9/10 after fall at home, imaging revealing L femoral neck fx. S/p L THA On 9/13. PMH includes DM2 and essential HTN.   Clinical Impression   Pt reports ind at baseline with ADLs/functional mobility, lives with family who can assist at d/c. Pt currently needing set up-max A for ADLs, mod A for bed mobility and min A +2 for transfers with RW. Pt becoming nauseous at end of session. BP 132/85 (100). Pt presenting with impairments listed below, will follow acutely. Recommend HHOT at d/c.       If plan is discharge home, recommend the following: A little help with walking and/or transfers;A lot of help with bathing/dressing/bathroom;Assistance with cooking/housework;Assist for transportation;Help with stairs or ramp for entrance    Functional Status Assessment  Patient has had a recent decline in their functional status and demonstrates the ability to make significant improvements in function in a reasonable and predictable amount of time.  Equipment Recommendations  BSC/3in1;Tub/shower seat;Other (comment) (RW)    Recommendations for Other Services PT consult     Precautions / Restrictions Precautions Precautions: Fall Restrictions Weight Bearing Restrictions: Yes LLE Weight Bearing: Weight bearing as tolerated      Mobility Bed Mobility Overal bed mobility: Needs Assistance Bed Mobility: Supine to Sit     Supine to sit: Mod assist          Transfers Overall transfer level: Needs assistance Equipment used: Rolling walker (2 wheels) Transfers: Sit to/from Stand Sit to Stand: Min assist, +2 physical assistance                  Balance Overall balance assessment: Needs assistance Sitting-balance support: Feet supported Sitting balance-Leahy Scale: Good     Standing  balance support: During functional activity, Reliant on assistive device for balance Standing balance-Leahy Scale: Poor Standing balance comment: RW in standing                           ADL either performed or assessed with clinical judgement   ADL Overall ADL's : Needs assistance/impaired Eating/Feeding: Set up;Sitting   Grooming: Set up;Sitting   Upper Body Bathing: Contact guard assist;Sitting   Lower Body Bathing: Sitting/lateral leans;Maximal assistance   Upper Body Dressing : Contact guard assist;Sitting   Lower Body Dressing: Maximal assistance   Toilet Transfer: Minimal assistance;+2 for safety/equipment   Toileting- Clothing Manipulation and Hygiene: Minimal assistance       Functional mobility during ADLs: Minimal assistance;Rolling walker (2 wheels);+2 for safety/equipment       Vision   Vision Assessment?: No apparent visual deficits     Perception Perception: Not tested       Praxis Praxis: Not tested       Pertinent Vitals/Pain Pain Assessment Pain Assessment: Faces Pain Score: 7  Faces Pain Scale: Hurts even more Pain Location: L hip Pain Descriptors / Indicators: Discomfort Pain Intervention(s): Limited activity within patient's tolerance, Monitored during session, Premedicated before session     Extremity/Trunk Assessment Upper Extremity Assessment Upper Extremity Assessment: Overall WFL for tasks assessed   Lower Extremity Assessment Lower Extremity Assessment: Defer to PT evaluation   Cervical / Trunk Assessment Cervical / Trunk Assessment: Normal   Communication Communication Communication: No apparent difficulties   Cognition Arousal: Alert Behavior During Therapy: [facility] for tasks  assessed/performed Overall Cognitive Status: Within Functional Limits for tasks assessed                                       General Comments  VSS on RA, BP 132/85 (100) at end of session    Exercises     Shoulder  Instructions      Home Living Family/patient expects to be discharged to:: Private residence Living Arrangements: Spouse/significant other;Children Available Help at Discharge: Family;Available 24 hours/day Type of Home: House Home Access: Ramped entrance     Home Layout: One level     Bathroom Shower/Tub: Walk-in shower         Home Equipment: Cane - single Producer, television/film/video (2 wheels);Hand held shower head          Prior Functioning/Environment Prior Level of Function : Independent/Modified Independent;Driving             Mobility Comments: Ind ADLs Comments: Ind        OT Problem List: Decreased strength;Decreased range of motion;Decreased activity tolerance;Impaired balance (sitting and/or standing);Decreased coordination;Decreased cognition;Decreased safety awareness;Cardiopulmonary status limiting activity      OT Treatment/Interventions: Self-care/ADL training;Therapeutic exercise;Energy conservation;DME and/or AE instruction;Therapeutic activities;Patient/family education;Balance training    OT Goals(Current goals can be found in the care plan section) Acute Rehab OT Goals Patient Stated Goal: none stated OT Goal Formulation: With patient Time For Goal Achievement: 01/07/23 Potential to Achieve Goals: Good  OT Frequency: Min 1X/week    Co-evaluation              AM-PAC OT "6 Clicks" Daily Activity     Outcome Measure Help from another person eating meals?: None Help from another person taking care of personal grooming?: A Little Help from another person toileting, which includes using toliet, bedpan, or urinal?: A Little Help from another person bathing (including washing, rinsing, drying)?: A Lot Help from another person to put on and taking off regular upper body clothing?: A Little Help from another person to put on and taking off regular lower body clothing?: A Lot 6 Click Score: 17   End of Session Equipment  Utilized During Treatment: Rolling walker (2 wheels) Nurse Communication: Mobility status  Activity Tolerance: Patient tolerated treatment well Patient left: with call bell/phone within reach;in chair;with family/visitor present  OT Visit Diagnosis: Unsteadiness on feet (R26.81);Other abnormalities of gait and mobility (R26.89);Muscle weakness (generalized) (M62.81)                Time: 5284-1324 OT Time Calculation (min): 26 min Charges:  OT General Charges $OT Visit: 1 Visit OT Evaluation $OT Eval Moderate Complexity: 1 Mod  Danayah Smyre K, OTD, OTR/L [employer] [facility] Acute Rehab [phone]   Mirella Gueye K Koonce 12/24/2022, 11:02 AM

## 2022-12-24 NOTE — Progress Notes (Signed)
[facility] PROGRESS NOTE   Luke Edwards WUJ:[id number] DOB: 02-17-1941 DOA: 12/20/2022  PCP: Galvin Proffer, MD  Brief History/Interval Summary: 82 year old male with past medical history of diabetes mellitus type 2, essential hypertension presented after sustaining a mechanical fall at home.  Was found to have left hip fracture.  Was hospitalized for further management.  Consultants: Orthopedics  Procedures: None yet    Subjective/Interval History: Patient continues to have pain in the hip area though it is slightly better compared to yesterday.  No new complaints offered.     Assessment/Plan:  Left hip fracture After significant delay due to unavailability of surgery and anesthesia patient subsequently underwent surgery on 9/13.  Pain control.   On aspirin twice a day for DVT prophylaxis.   PT and OT evaluation.  Diabetes mellitus type 2 On basal insulin at home.  Monitor CBGs.  Continue with glargine and SSI.  May need dose adjustments.   HbA1c 9.7.  Essential hypertension Holding lisinopril and HCTZ for now.  Monitor blood pressures.  Has improved.  Acute kidney injury Baseline creatinine is known.  Last lab values that he have in our system was from 2021 when his creatinine was 0.9.  He likely has developed a chronic kidney disease although it remains unclear.  He was given IV fluids for worsening renal function.  Creatinine stable for the most part today.  Monitor urine output.  Avoid nephrotoxic agents.    Hypokalemia Continue to supplement.  Magnesium 1.8.    Normocytic anemia Drop in hemoglobin is likely dilutional versus secondary to fracture.  Recheck labs tomorrow.    History of BPH Continue with tamsulosin and Proscar.  DVT Prophylaxis: Aspirin twice a day Code Status: Full code Family Communication: Discussed with patient and his wife Disposition Plan: To be determined    Medications: Scheduled:  aspirin  81 mg Oral BID   cephALEXin   500 mg Oral Q8H   docusate sodium  100 mg Oral BID   finasteride  5 mg Oral QHS   gabapentin  600 mg Oral BID   insulin aspart  0-15 Units Subcutaneous Q4H   insulin glargine-yfgn  5 Units Subcutaneous Daily   mupirocin ointment  1 Application Nasal BID   ondansetron (ZOFRAN) IV  4 mg Intravenous Once   pantoprazole  40 mg Oral Daily   potassium chloride  40 mEq Oral BID   senna  1 tablet Oral BID   tamsulosin  0.4 mg Oral QPC supper   Continuous:  sodium chloride 75 mL/hr at 12/23/22 2126   methocarbamol (ROBAXIN) IV     promethazine (PHENERGAN) injection (IM or IVPB)     NFA:OZHYQMVHQIONG, alum & mag hydroxide-simeth, bisacodyl, camphor-menthol, diphenhydrAMINE, HYDROcodone-acetaminophen, HYDROcodone-acetaminophen, melatonin, menthol-cetylpyridinium **OR** phenol, methocarbamol **OR** methocarbamol (ROBAXIN) IV, metoCLOPramide **OR** metoCLOPramide (REGLAN) injection, morphine injection, naLOXone (NARCAN)  injection, ondansetron (ZOFRAN) IV, polyethylene glycol, promethazine (PHENERGAN) injection (IM or IVPB)  Antibiotics: Anti-infectives (From admission, onward)    Start     Dose/Rate Route Frequency Ordered Stop   12/23/22 0718  ceFAZolin (ANCEF) 2-4 GM/100ML-% IVPB  Status:  Discontinued       Note to Pharmacy: [facility], [place]: cabinet override      12/23/22 0718 12/23/22 0722   12/23/22 0600  ceFAZolin (ANCEF) IVPB 2g/100 mL premix        2 g 200 mL/hr over 30 Minutes Intravenous On call to O.R. 12/22/22 1941 12/23/22 0806   12/22/22 0915  cephALEXin (KEFLEX) capsule 500 mg  500 mg Oral Every 8 hours 12/22/22 0826 12/26/22 0559   12/21/22 1230  sulfamethoxazole-trimethoprim (BACTRIM DS) 800-160 MG per tablet 1 tablet  Status:  Discontinued       Note to Pharmacy: For 7 days     1 tablet Oral 2 times daily 12/21/22 1136 12/22/22 0826       Objective:  Vital Signs  Vitals:   12/23/22 1103 12/23/22 2006 12/24/22 0550 12/24/22 0741  BP: 126/74 138/76 (!) 142/75  137/72  Pulse: 96 96 (!) 104 94  Resp: 19 16 16 18   Temp: 98.2 F (36.8 C) 98 F (36.7 C) 98.9 F (37.2 C) 98.4 F (36.9 C)  TempSrc: Oral Oral Oral Oral  SpO2: 90% 96% 93% 93%  Weight:        Intake/Output Summary (Last 24 hours) at 12/24/2022 0954 Last data filed at 12/24/2022 0408 Gross per 24 hour  Intake 1346.25 ml  Output --  Net 1346.25 ml   Filed Weights   12/22/22 0500 12/23/22 0355  Weight: 95.4 kg 97.6 kg    General appearance: Awake alert.  In no distress Resp: Clear to auscultation bilaterally.  Normal effort Cardio: S1-S2 is normal regular.  No S3-S4.  No rubs murmurs or bruit GI: Abdomen is soft.  Nontender nondistended.  Bowel sounds are present normal.  No masses organomegaly Extremities: No edema.     Lab Results:  Data Reviewed: I have personally reviewed following labs and reports of the imaging studies  CBC: Recent Labs  Lab 12/21/22 0000 12/21/22 0409 12/22/22 0647 12/23/22 0339 12/24/22 0407  WBC 15.4* 12.6* 10.1 9.2 10.3  NEUTROABS 11.1* 10.7*  --   --   --   HGB 12.9* 12.7* 12.6* 12.0* 10.6*  HCT 36.7* 36.5* 36.3* 35.5* 31.6*  MCV 90.4 89.0 91.9 89.6 91.6  PLT 230 212 187 190 164    Basic Metabolic Panel: Recent Labs  Lab 12/21/22 0000 12/21/22 0409 12/22/22 0647 12/23/22 0339 12/24/22 0407  NA 138 136 137 134* 134*  K 2.9* 3.4* 3.6 3.1* 3.3*  CL 104 104 102 103 104  CO2 20* 23 25 23  21*  GLUCOSE 186* 293* 208* 152* 184*  BUN 14 14 16 15 19   CREATININE 1.41* 1.36* 1.50* 1.30* 1.40*  CALCIUM 8.8* 8.3* 8.4* 7.8* 7.6*  MG 1.7 1.8  --  2.0 1.8    GFR: CrCl cannot be calculated (Unknown ideal weight.).  Liver Function Tests: Recent Labs  Lab 12/21/22 0409  AST 16  ALT 17  ALKPHOS 54  BILITOT 1.4*  PROT 6.5  ALBUMIN 3.7     Coagulation Profile: Recent Labs  Lab 12/21/22 0409  INR 1.1     CBG: Recent Labs  Lab 12/23/22 1602 12/23/22 1950 12/23/22 2338 12/24/22 0357 12/24/22 0739  GLUCAP 266* 282*  203* 184* 210*     Radiology Studies: DG Pelvis Portable  Result Date: 12/23/2022 CLINICAL DATA:  Postop left total hip arthroplasty. EXAM: PORTABLE PELVIS 1-2 VIEWS COMPARISON:  Intraoperative views same date. CT left hip 12/20/2022. FINDINGS: 1017 hours. Interval left total hip arthroplasty. The hardware appears well positioned. No evidence of acute fracture or dislocation. There is a small amount of gas within the soft tissue surrounding the left hip. No unexpected foreign body. Skin staples are in place. Mild right hip degenerative changes. IMPRESSION: Interval left total hip arthroplasty without demonstrated complication. Electronically Signed   By: Carey Bullocks M.D.   On: 12/23/2022 13:08   DG HIP UNILAT WITH PELVIS  1V LEFT  Result Date: 12/23/2022 CLINICAL DATA:  Total hip arthroplasty. EXAM: DG HIP (WITH OR WITHOUT PELVIS) 1V*L* COMPARISON:  CT 12/20/2022. FINDINGS: C-arm fluoroscopy was provided in the operating room without the presence of a radiologist.21.4 fluoroscopy time. 2.08 mGy air kerma. Five C-arm fluoroscopic images were obtained intraoperatively and are submitted for post operative interpretation. Images demonstrate the performance of a left total hip arthroplasty. No complications are identified. Please see intraoperative findings for further detail. IMPRESSION: Intraoperative fluoroscopic guidance for left total hip arthroplasty. Electronically Signed   By: Carey Bullocks M.D.   On: 12/23/2022 13:07   DG C-Arm 1-60 Min-No Report  Result Date: 12/23/2022 Fluoroscopy was utilized by the requesting physician.  No radiographic interpretation.   DG C-Arm 1-60 Min-No Report  Result Date: 12/23/2022 Fluoroscopy was utilized by the requesting physician.  No radiographic interpretation.       LOS: 4 days   Havanah Nelms [employer] on [email or web address]  12/24/2022, 9:54 AM

## 2022-12-25 DIAGNOSIS — S72002A Fracture of unspecified part of neck of left femur, initial encounter for closed fracture: Secondary | ICD-10-CM | POA: Diagnosis not present

## 2022-12-25 DIAGNOSIS — I10 Essential (primary) hypertension: Secondary | ICD-10-CM | POA: Diagnosis not present

## 2022-12-25 DIAGNOSIS — Z794 Long term (current) use of insulin: Secondary | ICD-10-CM | POA: Diagnosis not present

## 2022-12-25 DIAGNOSIS — E119 Type 2 diabetes mellitus without complications: Secondary | ICD-10-CM | POA: Diagnosis not present

## 2022-12-25 LAB — GLUCOSE, CAPILLARY
Glucose-Capillary: 153 mg/dL — ABNORMAL HIGH (ref 70–99)
Glucose-Capillary: 151 mg/dL — ABNORMAL HIGH (ref 70–99)
Glucose-Capillary: 237 mg/dL — ABNORMAL HIGH (ref 70–99)
Glucose-Capillary: 238 mg/dL — ABNORMAL HIGH (ref 70–99)
Glucose-Capillary: 226 mg/dL — ABNORMAL HIGH (ref 70–99)

## 2022-12-25 LAB — CBC
HCT: 28.3 % — ABNORMAL LOW (ref 39.0–52.0)
Hemoglobin: 9.5 g/dL — ABNORMAL LOW (ref 13.0–17.0)
MCH: 31.4 pg (ref 26.0–34.0)
MCHC: 33.6 g/dL (ref 30.0–36.0)
MCV: 93.4 fL (ref 80.0–100.0)
Platelets: 169 10*3/uL (ref 150–400)
RBC: 3.03 MIL/uL — ABNORMAL LOW (ref 4.22–5.81)
RDW: 13 % (ref 11.5–15.5)
WBC: 8.8 10*3/uL (ref 4.0–10.5)
nRBC: 0 % (ref 0.0–0.2)

## 2022-12-25 LAB — BASIC METABOLIC PANEL
Anion gap: 8 (ref 5–15)
BUN: 16 mg/dL (ref 8–23)
CO2: 20 mmol/L — ABNORMAL LOW (ref 22–32)
Calcium: 7.7 mg/dL — ABNORMAL LOW (ref 8.9–10.3)
Chloride: 106 mmol/L (ref 98–111)
Creatinine, Ser: 1.28 mg/dL — ABNORMAL HIGH (ref 0.61–1.24)
GFR, Estimated: 56 mL/min — ABNORMAL LOW (ref >60)
Glucose, Bld: 184 mg/dL — ABNORMAL HIGH (ref 70–99)
Potassium: 3.9 mmol/L (ref 3.5–5.1)
Sodium: 134 mmol/L — ABNORMAL LOW (ref 135–145)

## 2022-12-25 NOTE — Progress Notes (Signed)
[facility] PROGRESS NOTE   Luke Edwards Jiron ZOX:[id number] DOB: Dec 31, 1940 DOA: 12/20/2022  PCP: Galvin Proffer, MD  Brief History/Interval Summary: 82 year old male with past medical history of diabetes mellitus type 2, essential hypertension presented after sustaining a mechanical fall at home.  Was found to have left hip fracture.  Was hospitalized for further management.  Consultants: Orthopedics  Procedures: None yet    Subjective/Interval History: Patient continues to feel well.  Mobility is improving.  Denies any chest pain shortness of breath.  Pain is reasonably well-controlled.  Assessment/Plan:  Left hip fracture After significant delay due to unavailability of surgery and anesthesia patient subsequently underwent surgery on 9/13.  Pain control.   On aspirin twice a day for DVT prophylaxis.   Seen by PT and OT.  Home health is recommended. PT and OT to have another 1 or 2 sessions with him prior to discharge.  Diabetes mellitus type 2 On basal insulin at home.  Monitor CBGs.  Continue with glargine and SSI.  May need dose adjustments.   HbA1c 9.7.  Essential hypertension Holding lisinopril and HCTZ for now.  Blood pressure is reasonably well-controlled.  Acute kidney injury Baseline creatinine is unknown.  Last lab values that he have in our system was from 2021 when his creatinine was 0.9.  He likely has developed a chronic kidney disease although it remains unclear.  He was given IV fluids for worsening renal function.   Creatinine remains stable.  Avoid nephrotoxic agents.  Monitor urine output.     Hypokalemia Supplemented.  Magnesium 1.8.    Normocytic anemia/acute blood loss anemia Drop in hemoglobin is likely dilutional versus secondary to fracture.  Hemoglobin continues to drift down.  Hopefully it will stabilize soon.  Recheck labs tomorrow.    History of BPH Continue with tamsulosin and Proscar.  DVT Prophylaxis: Aspirin twice a day Code  Status: Full code Family Communication: Discussed with patient and his wife Disposition Plan: To be determined    Medications: Scheduled:  aspirin  81 mg Oral BID   cephALEXin  500 mg Oral Q8H   docusate sodium  100 mg Oral BID   finasteride  5 mg Oral QHS   gabapentin  600 mg Oral BID   insulin aspart  0-15 Units Subcutaneous Q4H   insulin glargine-yfgn  5 Units Subcutaneous Daily   ondansetron (ZOFRAN) IV  4 mg Intravenous Once   pantoprazole  40 mg Oral Daily   senna  1 tablet Oral BID   tamsulosin  0.4 mg Oral QPC supper   Continuous:  methocarbamol (ROBAXIN) IV     promethazine (PHENERGAN) injection (IM or IVPB)     WJX:BJYNWGNFAOZHY, alum & mag hydroxide-simeth, bisacodyl, camphor-menthol, diphenhydrAMINE, HYDROcodone-acetaminophen, HYDROcodone-acetaminophen, melatonin, menthol-cetylpyridinium **OR** phenol, methocarbamol **OR** methocarbamol (ROBAXIN) IV, metoCLOPramide **OR** metoCLOPramide (REGLAN) injection, morphine injection, naLOXone (NARCAN)  injection, ondansetron (ZOFRAN) IV, polyethylene glycol, promethazine (PHENERGAN) injection (IM or IVPB)  Antibiotics: Anti-infectives (From admission, onward)    Start     Dose/Rate Route Frequency Ordered Stop   12/23/22 0718  ceFAZolin (ANCEF) 2-4 GM/100ML-% IVPB  Status:  Discontinued       Note to Pharmacy: [facility], [place]: cabinet override      12/23/22 0718 12/23/22 0722   12/23/22 0600  ceFAZolin (ANCEF) IVPB 2g/100 mL premix        2 g 200 mL/hr over 30 Minutes Intravenous On call to O.R. 12/22/22 1941 12/23/22 0806   12/22/22 0915  cephALEXin (KEFLEX) capsule 500 mg  500 mg Oral Every 8 hours 12/22/22 0826 12/26/22 0559   12/21/22 1230  sulfamethoxazole-trimethoprim (BACTRIM DS) 800-160 MG per tablet 1 tablet  Status:  Discontinued       Note to Pharmacy: For 7 days     1 tablet Oral 2 times daily 12/21/22 1136 12/22/22 0826       Objective:  Vital Signs  Vitals:   12/24/22 0741 12/24/22 1945 12/25/22  0428 12/25/22 0921  BP: 137/72 (!) 144/83 131/80 131/76  Pulse: 94 96 83 91  Resp: 18 18 16 18   Temp: 98.4 F (36.9 C) (!) 100.4 F (38 C)  98.4 F (36.9 C)  TempSrc: Oral     SpO2: 93% 97% 94% 93%  Weight:        Intake/Output Summary (Last 24 hours) at 12/25/2022 0933 Last data filed at 12/25/2022 0400 Gross per 24 hour  Intake 840 ml  Output 650 ml  Net 190 ml   Filed Weights   12/22/22 0500 12/23/22 0355  Weight: 95.4 kg 97.6 kg    General appearance: Awake alert.  In no distress Resp: Clear to auscultation bilaterally.  Normal effort Cardio: S1-S2 is normal regular.  No S3-S4.  No rubs murmurs or bruit GI: Abdomen is soft.  Nontender nondistended.  Bowel sounds are present normal.  No masses organomegaly   Lab Results:  Data Reviewed: I have personally reviewed following labs and reports of the imaging studies  CBC: Recent Labs  Lab 12/21/22 0000 12/21/22 0409 12/22/22 0647 12/23/22 0339 12/24/22 0407 12/25/22 0405  WBC 15.4* 12.6* 10.1 9.2 10.3 8.8  NEUTROABS 11.1* 10.7*  --   --   --   --   HGB 12.9* 12.7* 12.6* 12.0* 10.6* 9.5*  HCT 36.7* 36.5* 36.3* 35.5* 31.6* 28.3*  MCV 90.4 89.0 91.9 89.6 91.6 93.4  PLT 230 212 187 190 164 169    Basic Metabolic Panel: Recent Labs  Lab 12/21/22 0000 12/21/22 0409 12/22/22 0647 12/23/22 0339 12/24/22 0407 12/25/22 0405  NA 138 136 137 134* 134* 134*  K 2.9* 3.4* 3.6 3.1* 3.3* 3.9  CL 104 104 102 103 104 106  CO2 20* 23 25 23  21* 20*  GLUCOSE 186* 293* 208* 152* 184* 184*  BUN 14 14 16 15 19 16   CREATININE 1.41* 1.36* 1.50* 1.30* 1.40* 1.28*  CALCIUM 8.8* 8.3* 8.4* 7.8* 7.6* 7.7*  MG 1.7 1.8  --  2.0 1.8  --     GFR: CrCl cannot be calculated (Unknown ideal weight.).  Liver Function Tests: Recent Labs  Lab 12/21/22 0409  AST 16  ALT 17  ALKPHOS 54  BILITOT 1.4*  PROT 6.5  ALBUMIN 3.7     Coagulation Profile: Recent Labs  Lab 12/21/22 0409  INR 1.1     CBG: Recent Labs  Lab  12/24/22 1709 12/24/22 1943 12/24/22 2356 12/25/22 0426 12/25/22 0748  GLUCAP 233* 244* 217* 153* 151*     Radiology Studies: DG Pelvis Portable  Result Date: 12/23/2022 CLINICAL DATA:  Postop left total hip arthroplasty. EXAM: PORTABLE PELVIS 1-2 VIEWS COMPARISON:  Intraoperative views same date. CT left hip 12/20/2022. FINDINGS: 1017 hours. Interval left total hip arthroplasty. The hardware appears well positioned. No evidence of acute fracture or dislocation. There is a small amount of gas within the soft tissue surrounding the left hip. No unexpected foreign body. Skin staples are in place. Mild right hip degenerative changes. IMPRESSION: Interval left total hip arthroplasty without demonstrated complication. Electronically Signed   By:  Carey Bullocks M.D.   On: 12/23/2022 13:08       LOS: 5 days   Olivianna Higley Rito Ehrlich  [facility] Pager on [email or web address]  12/25/2022, 9:33 AM

## 2022-12-25 NOTE — Progress Notes (Signed)
Physical Therapy Treatment Patient Details Name: Luke Edwards MRN: [id number] DOB: Feb 26, 1941 Today's Date: 12/25/2022   History of Present Illness Pt is an 82 y/o male presenting to ED on 9/10 after fall at home, imaging revealing L femoral neck fx. S/p L THA On 9/13. PMH includes DM2 and essential HTN.    PT Comments  The pt was agreeable to session, able to demo good progress with ambulation distance without need for seated or standing rest. The pt continues to have most difficulty with sit-stand transfers due to deficits in strength and power as well as impact of pain in LLE. He tolerated repeated sit-stand practice from elevated EOB needing only CGA and repeated verbal cues. Pt family present and very supportive, asking great questions about home set up, DME use, and how to safely assist pt at home. Will continue to benefit from skilled PT to progress independence with transfers (but family has gotten a lift chair for the patient) to allow for maximal independence after d/c home.     If plan is discharge home, recommend the following: A little help with bathing/dressing/bathroom;A little help with walking and/or transfers;Assistance with cooking/housework;Assist for transportation;Help with stairs or ramp for entrance   Can travel by private vehicle        Equipment Recommendations  None recommended by PT    Recommendations for Other Services       Precautions / Restrictions Precautions Precautions: Fall Restrictions Weight Bearing Restrictions: Yes LLE Weight Bearing: Weight bearing as tolerated     Mobility  Bed Mobility Overal bed mobility: Needs Assistance             General bed mobility comments: pt OOB in recliner at start and end of session    Transfers Overall transfer level: Needs assistance Equipment used: Rolling walker (2 wheels) Transfers: Sit to/from Stand Sit to Stand: Mod assist, Min assist           General transfer comment: modA for  initial stand, significantly increased time. after cues for feet positioning, hand positioning, and multiple reps, pt able to complete with minA. pt also practiced x5 from elevated EOB with CGA    Ambulation/Gait Ambulation/Gait assistance: Contact guard assist (family followed with chair, did not use) Gait Distance (Feet): 90 Feet Assistive device: Rolling walker (2 wheels) Gait Pattern/deviations: Decreased stride length, Step-through pattern, Antalgic, Decreased weight shift to left Gait velocity: decreased     General Gait Details: no buckling or LOB, limited by fatigue and pain       Balance Overall balance assessment: Needs assistance Sitting-balance support: Feet supported Sitting balance-Leahy Scale: Good     Standing balance support: During functional activity, Reliant on assistive device for balance Standing balance-Leahy Scale: Poor Standing balance comment: RW in standing                            Cognition Arousal: Alert Behavior During Therapy: WFL for tasks assessed/performed Overall Cognitive Status: Within Functional Limits for tasks assessed                                 General Comments: not formally assessed, following cues well        Exercises Other Exercises Other Exercises: repeated sit-stands from elevated EOB. repeated cues for hand positoining, but able to complete with CGA. x5    General Comments General comments (skin integrity, edema,  etc.): VSS on RA, family educated on various aspects of mobility and safety with mobility after d/c home.      Pertinent Vitals/Pain Pain Assessment Pain Assessment: Faces Faces Pain Scale: Hurts even more Pain Location: L hip Pain Descriptors / Indicators: Discomfort Pain Intervention(s): Limited activity within patient's tolerance, Monitored during session, Repositioned    Home Living Family/patient expects to be discharged to:: Private residence Living Arrangements:  Spouse/significant other;Children Available Help at Discharge: Family;Available 24 hours/day Type of Home: House Home Access: Ramped entrance       Home Layout: One level Home Equipment: Cane - single Producer, television/film/video (2 wheels);Hand held shower head      Prior Function            PT Goals (current goals can now be found in the care plan section) Acute Rehab PT Goals Patient Stated Goal: to go home PT Goal Formulation: With patient Time For Goal Achievement: 01/07/23 Potential to Achieve Goals: Good Progress towards PT goals: Progressing toward goals    Frequency    Min 1X/week       AM-PAC PT "6 Clicks" Mobility   Outcome Measure  Help needed turning from your back to your side while in a flat bed without using bedrails?: A Little Help needed moving from lying on your back to sitting on the side of a flat bed without using bedrails?: A Little Help needed moving to and from a bed to a chair (including a wheelchair)?: A Lot Help needed standing up from a chair using your arms (e.g., wheelchair or bedside chair)?: A Little Help needed to walk in hospital room?: A Little Help needed climbing 3-5 steps with a railing? : A Lot 6 Click Score: 16    End of Session Equipment Utilized During Treatment: Gait belt Activity Tolerance: Patient tolerated treatment well Patient left: in chair;with call bell/phone within reach;with chair alarm set;with family/visitor present Nurse Communication: Mobility status PT Visit Diagnosis: Other abnormalities of gait and mobility (R26.89)     Time: 8119-1478 PT Time Calculation (min) (ACUTE ONLY): 38 min  Charges:    $Gait Training: 8-22 mins $Therapeutic Exercise: 8-22 mins $Self Care/Home Management: 8-22 PT General Charges $$ ACUTE PT VISIT: 1 Visit                     Vickki Muff, PT, DPT   Acute Rehabilitation Department Office [phone] Secure Chat Communication Preferred   Ronnie Derby 12/25/2022, 5:15 PM

## 2022-12-25 NOTE — Progress Notes (Signed)
   Subjective: 2 Days Post-Op Procedure(s) (LRB): TOTAL HIP ARTHROPLASTY ANTERIOR APPROACH (Left)  Pt doing well Minimal soreness but no real pain Therapy progressed well yesterday Denies any new symptoms or issues overnight Plan for d/c tomorrow Patient reports pain as mild.  Objective:   VITALS:   Vitals:   12/25/22 0428 12/25/22 0921  BP: 131/80 131/76  Pulse: 83 91  Resp: 16 18  Temp:  98.4 F (36.9 C)  SpO2: 94% 93%    Left hip incision healing well Dressing intact Nv intact distally No rashes or edema distally Good early rom  LABS Recent Labs    12/23/22 0339 12/24/22 0407 12/25/22 0405  HGB 12.0* 10.6* 9.5*  HCT 35.5* 31.6* 28.3*  WBC 9.2 10.3 8.8  PLT 190 164 169    Recent Labs    12/23/22 0339 12/24/22 0407 12/25/22 0405  NA 134* 134* 134*  K 3.1* 3.3* 3.9  BUN 15 19 16   CREATININE 1.30* 1.40* 1.28*  GLUCOSE 152* 184* 184*     Assessment/Plan: 2 Days Post-Op Procedure(s) (LRB): TOTAL HIP ARTHROPLASTY ANTERIOR APPROACH (Left) Overall pt doing very well D/c planning for tomorrow - follow up in the office in 2 weeks Pain management as needed Continue PT/OT   Alphonsa Overall PA-C, MPAS [facility] is now [facility]  [facility] [address], [address], [place], [place] [address] Phone: [phone] [email or web address] Facebook  [employer]

## 2022-12-26 DIAGNOSIS — S72002A Fracture of unspecified part of neck of left femur, initial encounter for closed fracture: Secondary | ICD-10-CM | POA: Diagnosis not present

## 2022-12-26 LAB — BASIC METABOLIC PANEL
Anion gap: 8 (ref 5–15)
BUN: 16 mg/dL (ref 8–23)
CO2: 23 mmol/L (ref 22–32)
Calcium: 8 mg/dL — ABNORMAL LOW (ref 8.9–10.3)
Chloride: 103 mmol/L (ref 98–111)
Creatinine, Ser: 1.26 mg/dL — ABNORMAL HIGH (ref 0.61–1.24)
GFR, Estimated: 57 mL/min — ABNORMAL LOW (ref >60)
Glucose, Bld: 207 mg/dL — ABNORMAL HIGH (ref 70–99)
Potassium: 4 mmol/L (ref 3.5–5.1)
Sodium: 134 mmol/L — ABNORMAL LOW (ref 135–145)

## 2022-12-26 LAB — CBC
HCT: 27.2 % — ABNORMAL LOW (ref 39.0–52.0)
Hemoglobin: 9.4 g/dL — ABNORMAL LOW (ref 13.0–17.0)
MCH: 31.9 pg (ref 26.0–34.0)
MCHC: 34.6 g/dL (ref 30.0–36.0)
MCV: 92.2 fL (ref 80.0–100.0)
Platelets: 168 10*3/uL (ref 150–400)
RBC: 2.95 MIL/uL — ABNORMAL LOW (ref 4.22–5.81)
RDW: 12.9 % (ref 11.5–15.5)
WBC: 8.7 10*3/uL (ref 4.0–10.5)
nRBC: 0 % (ref 0.0–0.2)

## 2022-12-26 LAB — GLUCOSE, CAPILLARY
Glucose-Capillary: 241 mg/dL — ABNORMAL HIGH (ref 70–99)
Glucose-Capillary: 191 mg/dL — ABNORMAL HIGH (ref 70–99)
Glucose-Capillary: 162 mg/dL — ABNORMAL HIGH (ref 70–99)

## 2022-12-26 MED ORDER — METHOCARBAMOL 500 MG PO TABS: 500.0000 mg | ORAL_TABLET | Freq: Four times a day (QID) | ORAL | 0 refills | Status: DC | PRN

## 2022-12-26 MED ORDER — HYDROCODONE-ACETAMINOPHEN 5-325 MG PO TABS: 1.0000 | ORAL_TABLET | Freq: Four times a day (QID) | ORAL | 0 refills | Status: DC | PRN

## 2022-12-26 MED ORDER — POLYETHYLENE GLYCOL 3350 17 G PO PACK: 17.0000 g | PACK | Freq: Every day | ORAL | 0 refills | Status: DC | PRN

## 2022-12-26 MED ORDER — ASPIRIN 81 MG PO CHEW: 81.0000 mg | CHEWABLE_TABLET | Freq: Two times a day (BID) | ORAL | 0 refills | Status: AC

## 2022-12-26 MED ORDER — SENNA 8.6 MG PO TABS: 1.0000 | ORAL_TABLET | Freq: Two times a day (BID) | ORAL | 0 refills | Status: DC

## 2022-12-26 NOTE — TOC Transition Note (Addendum)
Transition of Care [facility]) - CM/SW Discharge Note   Patient Details  Name: Luke Edwards MRN: [id number] Date of Birth: 03/01/1941  Transition of Care [facility]) CM/SW Contact:  Epifanio Lesches, RN Phone Number: 12/26/2022, 9:51 AM   Clinical Narrative:    Patient will DC to: home Anticipated DC date: 12/26/2022 Family notified: yes Transport by: car   Per MD patient ready for DC today. RN, patient, and  patient's family notified of DC. Family ( wife/daughter)to assist with care once d/c .Pt agreeable to home health services. Pt without preference provider. Referral made with [facility]  and accepted. Pt with only DME BSC need. Declined a RW and tub/shower seat. Referral made with [facility].  Equipment will be delivered to bedside prior to d/c. Pt without RX med concerns.Post hospital f/u noted on AVS.  Family to provide transportation to home. RNCM will sign off for now as intervention is no longer needed. Please consult [place] again if new needs arise.   Final next level of care: Home w Home Health Services Barriers to Discharge: No Barriers Identified   Patient Goals and CMS Choice   Choice offered to / list presented to : Patient  Discharge Placement                         Discharge Plan and Services Additional resources added to the After Visit Summary for                  DME Arranged: Other see comment, Bedside commode (tub/shower seat) DME Agency: [facility] Date DME Agency Contacted: 12/26/22 Time DME Agency Contacted: [phone] Representative spoke with at DME Agency: Ian Malkin HH Arranged: OT HH Agency: [facility] Date [facility] Agency Contacted: 12/26/22 Time HH Agency Contacted: [phone] Representative spoke with at [facility] Agency: Tresa Endo  Social Determinants of Health (SDOH) Interventions SDOH Screenings   Food Insecurity: No Food Insecurity (12/21/2022)  Tobacco Use: Low Risk  (12/20/2022)     Readmission Risk Interventions     No  data to display

## 2022-12-26 NOTE — Plan of Care (Signed)
Problem: Education: Goal: Knowledge of the prescribed therapeutic regimen will improve Outcome: Adequate for Discharge Goal: Understanding of discharge needs will improve Outcome: Adequate for Discharge Goal: Individualized Educational Video(s) Outcome: Adequate for Discharge   Problem: Activity: Goal: Ability to avoid complications of mobility impairment will improve Outcome: Adequate for Discharge Goal: Ability to tolerate increased activity will improve Outcome: Adequate for Discharge   Problem: Clinical Measurements: Goal: Postoperative complications will be avoided or minimized Outcome: Adequate for Discharge   Problem: Pain Management: Goal: Pain level will decrease with appropriate interventions Outcome: Adequate for Discharge   Problem: Skin Integrity: Goal: Will show signs of wound healing Outcome: Adequate for Discharge

## 2022-12-26 NOTE — Progress Notes (Signed)
Physical Therapy Treatment Patient Details Name: Luke Edwards MRN: [id number] DOB: [birth year] Today's Date: 12/26/2022   History of Present Illness Pt is an 82 y/o male presenting to ED on 9/10 after fall at home, imaging revealing L femoral neck fx. S/p L THA On 9/13. PMH includes DM2 and essential HTN.    PT Comments  Pt tolerated treatment well today. Pt was able to progress ambulation today with RW supervision. No change in DC/DME recs at this time. Pt anticipates DC home today. All questions answered for very involved family.   If plan is discharge home, recommend the following: A little help with bathing/dressing/bathroom;A little help with walking and/or transfers;Assistance with cooking/housework;Assist for transportation;Help with stairs or ramp for entrance   Can travel by private vehicle        Equipment Recommendations  None recommended by PT    Recommendations for Other Services       Precautions / Restrictions Precautions Precautions: Fall Restrictions Weight Bearing Restrictions: Yes LLE Weight Bearing: Weight bearing as tolerated     Mobility  Bed Mobility Overal bed mobility: Needs Assistance Bed Mobility: Supine to Sit     Supine to sit: Min assist, HOB elevated, Used rails     General bed mobility comments: educated pt in using R LE to assist L over EOB, physical assist to complete L LE over EOB and pulled up trunk on therapist hand    Transfers Overall transfer level: Needs assistance Equipment used: Rolling walker (2 wheels) Transfers: Sit to/from Stand Sit to Stand: Min assist, From elevated surface           General transfer comment: cues for hand placement, assist to rise and steady    Ambulation/Gait Ambulation/Gait assistance: Supervision Gait Distance (Feet): 90 Feet Assistive device: Rolling walker (2 wheels) Gait Pattern/deviations: Decreased stride length, Step-through pattern, Antalgic, Decreased weight shift to left Gait  velocity: decreased     General Gait Details: no LOB noted.   Stairs             Wheelchair Mobility     Tilt Bed    Modified Rankin (Stroke Patients Only)       Balance Overall balance assessment: Needs assistance Sitting-balance support: Feet supported Sitting balance-Leahy Scale: Good     Standing balance support: During functional activity, Reliant on assistive device for balance Standing balance-Leahy Scale: Poor Standing balance comment: can release walker in static standing, but stabilizes LEs on bed                            Cognition Arousal: Alert Behavior During Therapy: WFL for tasks assessed/performed Overall Cognitive Status: Within Functional Limits for tasks assessed                                 General Comments: minimizes difficulty and pain        Exercises      General Comments General comments (skin integrity, edema, etc.): VSS      Pertinent Vitals/Pain Pain Assessment Pain Assessment: Faces Faces Pain Scale: Hurts little more Pain Location: L hip Pain Descriptors / Indicators: Grimacing, Discomfort, Sore Pain Intervention(s): Monitored during session    Home Living                          Prior Function  PT Goals (current goals can now be found in the care plan section) Progress towards PT goals: Progressing toward goals    Frequency    Min 1X/week      PT Plan      Co-evaluation              AM-PAC PT "6 Clicks" Mobility   Outcome Measure  Help needed turning from your back to your side while in a flat bed without using bedrails?: A Little Help needed moving from lying on your back to sitting on the side of a flat bed without using bedrails?: A Little Help needed moving to and from a bed to a chair (including a wheelchair)?: A Little Help needed standing up from a chair using your arms (e.g., wheelchair or bedside chair)?: A Little Help needed to walk  in hospital room?: A Little Help needed climbing 3-5 steps with a railing? : A Lot 6 Click Score: 17    End of Session Equipment Utilized During Treatment: Gait belt Activity Tolerance: Patient tolerated treatment well Patient left: in chair;with call bell/phone within reach;with chair alarm set;with family/visitor present Nurse Communication: Mobility status PT Visit Diagnosis: Other abnormalities of gait and mobility (R26.89)     Time: 1610-9604 PT Time Calculation (min) (ACUTE ONLY): 17 min  Charges:    $Gait Training: 8-22 mins PT General Charges $$ ACUTE PT VISIT: 1 Visit                     Shela Nevin, PT, DPT Acute Rehab Services [phone]    Gladys Damme 12/26/2022, 10:37 AM

## 2022-12-26 NOTE — Progress Notes (Signed)
    Durable Medical Equipment  (From admission, onward)           Start     Ordered   12/26/22 0935  For home use only DME 3 n 1  Once       Comments: Bedside commode. Confined to one room.   12/26/22 0935   12/26/22 0848  For home use only DME Walker rolling  Once       Question Answer Comment  Walker: With 5 Inch Wheels   Patient needs a walker to treat with the following condition Physical deconditioning      12/26/22 0847   12/26/22 0833  For home use only DME Other see comment  Once       Comments: Tub/shower seat  Question:  Length of Need  Answer:  6 Months   12/26/22 0347

## 2022-12-26 NOTE — Progress Notes (Signed)
Occupational Therapy Treatment Patient Details Name: Luke Edwards MRN: [id number] DOB: 04-Jun-1940 Today's Date: 12/26/2022   History of present illness Pt is an 82 y/o male presenting to ED on 9/10 after fall at home, imaging revealing L femoral neck fx. S/p L THA On 9/13. PMH includes DM2 and essential HTN.   OT comments  Pt and family without concern about pt returning home. Educated pt and family in multiple uses of 3 in 1, would like a second one for home. Instructed in shower transfer with RW, has a tub seat already in place. Instructed to dress L LE first and then R with LB dressing. Pt able to pull pants up with CGA stabilizing back of legs on bed. Requires min assist for bed mobility and sit to stand. Cautioned against overly using lift chair mechanism long term. Continue to recommend HHOT.       If plan is discharge home, recommend the following:  A little help with walking and/or transfers;A lot of help with bathing/dressing/bathroom;Assistance with cooking/housework;Assist for transportation;Help with stairs or ramp for entrance   Equipment Recommendations  BSC/3in1    Recommendations for Other Services      Precautions / Restrictions Precautions Precautions: Fall Restrictions Weight Bearing Restrictions: Yes LLE Weight Bearing: Weight bearing as tolerated       Mobility Bed Mobility Overal bed mobility: Needs Assistance Bed Mobility: Supine to Sit     Supine to sit: Min assist, HOB elevated, Used rails     General bed mobility comments: educated pt in using R LE to assist L over EOB, physical assist to complete L LE over EOB and pulled up trunk on therapist hand    Transfers Overall transfer level: Needs assistance Equipment used: Rolling walker (2 wheels) Transfers: Sit to/from Stand Sit to Stand: Min assist, From elevated surface           General transfer comment: cues for hand placement, assist to rise and steady     Balance Overall balance  assessment: Needs assistance Sitting-balance support: Feet supported Sitting balance-Leahy Scale: Good     Standing balance support: During functional activity, Reliant on assistive device for balance Standing balance-Leahy Scale: Poor Standing balance comment: can release walker in static standing, but stabilizes LEs on bed                           ADL either performed or assessed with clinical judgement   ADL Overall ADL's : Needs assistance/impaired                 Upper Body Dressing : Minimal assistance;Standing   Lower Body Dressing: Moderate assistance;Sit to/from stand Lower Body Dressing Details (indicate cue type and reason): assist to start pants over feet, pt able to pull up with CGA       Toileting - Clothing Manipulation Details (indicate cue type and reason): educated in multiple uses of 3 in 1   Tub/Shower Transfer Details (indicate cue type and reason): educated in posterior transfer to shower stall at home Functional mobility during ADLs: Contact guard assist;Rolling walker (2 wheels)      Extremity/Trunk Assessment              Vision       Perception     Praxis      Cognition Arousal: Alert Behavior During Therapy: WFL for tasks assessed/performed Overall Cognitive Status: Within Functional Limits for tasks assessed  General Comments: minimizes difficulty and pain        Exercises      Shoulder Instructions       General Comments      Pertinent Vitals/ Pain       Pain Assessment Pain Assessment: Faces Faces Pain Scale: Hurts little more Pain Location: L hip Pain Descriptors / Indicators: Grimacing, Discomfort, Sore Pain Intervention(s): Monitored during session  Home Living                                          Prior Functioning/Environment              Frequency  Min 1X/week        Progress Toward Goals  OT Goals(current goals  can now be found in the care plan section)  Progress towards OT goals: Progressing toward goals  Acute Rehab OT Goals OT Goal Formulation: With patient Time For Goal Achievement: 01/07/23 Potential to Achieve Goals: Good  Plan      Co-evaluation                 AM-PAC OT "6 Clicks" Daily Activity     Outcome Measure   Help from another person eating meals?: None Help from another person taking care of personal grooming?: A Little Help from another person toileting, which includes using toliet, bedpan, or urinal?: A Little Help from another person bathing (including washing, rinsing, drying)?: A Lot Help from another person to put on and taking off regular upper body clothing?: A Little Help from another person to put on and taking off regular lower body clothing?: A Lot 6 Click Score: 17    End of Session    OT Visit Diagnosis: Unsteadiness on feet (R26.81);Other abnormalities of gait and mobility (R26.89);Muscle weakness (generalized) (M62.81)   Activity Tolerance Patient tolerated treatment well   Patient Left Other (comment) (walking with PT)   Nurse Communication          Time: 3086-5784 OT Time Calculation (min): 20 min  Charges: OT General Charges $OT Visit: 1 Visit OT Treatments $Self Care/Home Management : 8-22 mins  Berna Spare, OTR/L Acute Rehabilitation Services Office: [phone]   Evern Bio 12/26/2022, 9:58 AM

## 2022-12-26 NOTE — Discharge Summary (Signed)
[facility]  Physician Discharge Summary   Patient ID: Luke Edwards MRN: [id number] DOB/AGE: 08/02/1940 82 y.o.  Admit date: 12/20/2022 Discharge date: 12/26/2022    PCP: Galvin Proffer, MD  DISCHARGE DIAGNOSES:    Closed fracture of neck of left femur (HCC)   Essential hypertension   DM2 (diabetes mellitus, type 2) (HCC)   BPH (benign prostatic hyperplasia)   RECOMMENDATIONS FOR OUTPATIENT FOLLOW UP: Home health has been ordered Outpatient follow-up with orthopedics   Home Health: PT and OT Equipment/Devices: None  CODE STATUS: Full code  DISCHARGE CONDITION: fair  Diet recommendation: As before  INITIAL HISTORY: 82 year old male with past medical history of diabetes mellitus type 2, essential hypertension presented after sustaining a mechanical fall at home.  Was found to have left hip fracture.  Was hospitalized for further management.   Consultants: Orthopedics  HOSPITAL COURSE:   Left hip fracture After significant delay due to unavailability of surgery and anesthesia patient subsequently underwent surgery on 9/13.   On aspirin twice a day for DVT prophylaxis.   Seen by PT and OT.  Home health is recommended.    Diabetes mellitus type 2 HbA1c 9.7.  Continue with home medications.     Essential hypertension Continue with home medications   Acute kidney injury Baseline creatinine is unknown.  Last lab values that he have in our system was from 2021 when his creatinine was 0.9.  He likely has developed a chronic kidney disease although it remains unclear.  He was given IV fluids for worsening renal function.   Pain has been stable between 1.3 to 1.5.  Hypokalemia Supplemented.  Magnesium 1.8.     Normocytic anemia/acute blood loss anemia Drop in hemoglobin is likely dilutional versus secondary to fracture.  Hemoglobin has dropped but stable for the last 2 days.   History of BPH Continue with tamsulosin and Proscar.  Patient is stable.   Seen by physical therapy.  Home health is recommended.  Okay for discharge home today.   PERTINENT LABS:  The results of significant diagnostics from this hospitalization (including imaging, microbiology, ancillary and laboratory) are listed below for reference.    Microbiology: Recent Results (from the past 240 hour(s))  Surgical PCR screen     Status: None   Collection Time: 12/21/22 11:57 PM   Specimen: Nasal Mucosa; Nasal Swab  Result Value Ref Range Status   MRSA, PCR NEGATIVE NEGATIVE Final   Staphylococcus aureus NEGATIVE NEGATIVE Final    Comment: (NOTE) The Xpert SA Assay (FDA approved for NASAL specimens in patients 109 years of age and older), is one component of a comprehensive surveillance program. It is not intended to diagnose infection nor to guide or monitor treatment. Performed at [facility], [address], [place], [place] [address]      Labs:   Basic Metabolic Panel: Recent Labs  Lab 12/21/22 0000 12/21/22 0409 12/22/22 0647 12/23/22 0339 12/24/22 0407 12/25/22 0405 12/26/22 0423  NA 138 136 137 134* 134* 134* 134*  K 2.9* 3.4* 3.6 3.1* 3.3* 3.9 4.0  CL 104 104 102 103 104 106 103  CO2 20* 23 25 23  21* 20* 23  GLUCOSE 186* 293* 208* 152* 184* 184* 207*  BUN 14 14 16 15 19 16 16   CREATININE 1.41* 1.36* 1.50* 1.30* 1.40* 1.28* 1.26*  CALCIUM 8.8* 8.3* 8.4* 7.8* 7.6* 7.7* 8.0*  MG 1.7 1.8  --  2.0 1.8  --   --    Liver Function Tests:  Recent Labs  Lab 12/21/22 0409  AST 16  ALT 17  ALKPHOS 54  BILITOT 1.4*  PROT 6.5  ALBUMIN 3.7    CBC: Recent Labs  Lab 12/21/22 0000 12/21/22 0409 12/22/22 0647 12/23/22 0339 12/24/22 0407 12/25/22 0405 12/26/22 0423  WBC 15.4* 12.6* 10.1 9.2 10.3 8.8 8.7  NEUTROABS 11.1* 10.7*  --   --   --   --   --   HGB 12.9* 12.7* 12.6* 12.0* 10.6* 9.5* 9.4*  HCT 36.7* 36.5* 36.3* 35.5* 31.6* 28.3* 27.2*  MCV 90.4 89.0 91.9 89.6 91.6 93.4 92.2  PLT 230 212 187 190 164 169 168     CBG: Recent  Labs  Lab 12/25/22 1608 12/25/22 2018 12/26/22 0207 12/26/22 0443 12/26/22 0736  GLUCAP 238* 226* 241* 191* 162*     IMAGING STUDIES DG Pelvis Portable  Result Date: 12/23/2022 CLINICAL DATA:  Postop left total hip arthroplasty. EXAM: PORTABLE PELVIS 1-2 VIEWS COMPARISON:  Intraoperative views same date. CT left hip 12/20/2022. FINDINGS: 1017 hours. Interval left total hip arthroplasty. The hardware appears well positioned. No evidence of acute fracture or dislocation. There is a small amount of gas within the soft tissue surrounding the left hip. No unexpected foreign body. Skin staples are in place. Mild right hip degenerative changes. IMPRESSION: Interval left total hip arthroplasty without demonstrated complication. Electronically Signed   By: Carey Bullocks M.D.   On: 12/23/2022 13:08   DG HIP UNILAT WITH PELVIS 1V LEFT  Result Date: 12/23/2022 CLINICAL DATA:  Total hip arthroplasty. EXAM: DG HIP (WITH OR WITHOUT PELVIS) 1V*L* COMPARISON:  CT 12/20/2022. FINDINGS: C-arm fluoroscopy was provided in the operating room without the presence of a radiologist.21.4 fluoroscopy time. 2.08 mGy air kerma. Five C-arm fluoroscopic images were obtained intraoperatively and are submitted for post operative interpretation. Images demonstrate the performance of a left total hip arthroplasty. No complications are identified. Please see intraoperative findings for further detail. IMPRESSION: Intraoperative fluoroscopic guidance for left total hip arthroplasty. Electronically Signed   By: Carey Bullocks M.D.   On: 12/23/2022 13:07   DG C-Arm 1-60 Min-No Report  Result Date: 12/23/2022 Fluoroscopy was utilized by the requesting physician.  No radiographic interpretation.   DG C-Arm 1-60 Min-No Report  Result Date: 12/23/2022 Fluoroscopy was utilized by the requesting physician.  No radiographic interpretation.   DG Knee 2 Views Left  Result Date: 12/20/2022 CLINICAL DATA:  Fall, hip fracture EXAM:  LEFT KNEE - 1-2 VIEW COMPARISON:  None Available. FINDINGS: No fracture or dislocation is seen. Moderate tricompartmental degenerative changes, prominent in the medial compartment. Visualized soft tissues are within normal limits. No suprapatellar knee joint effusion. IMPRESSION: No fracture or dislocation is seen. Moderate degenerative changes. Electronically Signed   By: Charline Bills M.D.   On: 12/20/2022 23:42   CT Head Wo Contrast  Result Date: 12/20/2022 CLINICAL DATA:  Head trauma, minor (Age >= 65y); Neck trauma (Age >= 65y), fall from roof EXAM: CT HEAD WITHOUT CONTRAST CT CERVICAL SPINE WITHOUT CONTRAST TECHNIQUE: Multidetector CT imaging of the head and cervical spine was performed following the standard protocol without intravenous contrast. Multiplanar CT image reconstructions of the cervical spine were also generated. RADIATION DOSE REDUCTION: This exam was performed according to the departmental dose-optimization program which includes automated exposure control, adjustment of the mA and/or kV according to patient size and/or use of iterative reconstruction technique. COMPARISON:  None Available. FINDINGS: CT HEAD FINDINGS Brain: Normal anatomic configuration. Parenchymal volume loss is commensurate with the patient's  age. Moderate periventricular white matter changes are present likely reflecting the sequela of small vessel ischemia. Remote left thalamic infarct again noted. No abnormal intra or extra-axial mass lesion or fluid collection. No abnormal mass effect or midline shift. No evidence of acute intracranial hemorrhage or infarct. Ventricular size is normal. Cerebellum unremarkable. Vascular: No asymmetric hyperdense vasculature at the skull base. Skull: Intact Sinuses/Orbits: Right maxillary antrectomy has been performed. Mucous retention cyst noted within the visualized left maxillary sinus. Otherwise, paranasal sinuses are clear. Orbits are unremarkable. Other: Mastoid air cells and  middle ear cavities are clear. CT CERVICAL SPINE FINDINGS Alignment: Normal. Skull base and vertebrae: No acute fracture. No primary bone lesion or focal pathologic process. Soft tissues and spinal canal: No prevertebral fluid or swelling. No visible canal hematoma. Posterior disc osteophyte complex at C4-5 and posterior disc bulge at C3-4 in combination with laminar hypertrophy and mild shortening of the pedicles results in mild central canal stenosis with flattening of the thecal sac. Spinal canal is otherwise widely patent. Disc levels: There is intervertebral disc space narrowing and endplate remodeling of C4-C7 in keeping with changes of mild to moderate degenerative disc disease, most severe at C4-5. Prevertebral soft tissues are not thickened on sagittal reformats. Multilevel uncovertebral and facet arthrosis results in multilevel moderate to severe neuroforaminal narrowing, most severe bilaterally at C4-5, on the left at C5-6, and bilaterally at C6-7. Upper chest: Negative. Other: None IMPRESSION: 1. No acute intracranial abnormality. No calvarial fracture. 2. Stable senescent change and remote left thalamic infarct. 3. No acute fracture or listhesis of the cervical spine. 4. Multilevel degenerative disc and degenerative joint disease resulting in multilevel moderate to severe neuroforaminal narrowing as described above. Electronically Signed   By: Helyn Numbers M.D.   On: 12/20/2022 21:57   CT Cervical Spine Wo Contrast  Result Date: 12/20/2022 CLINICAL DATA:  Head trauma, minor (Age >= 65y); Neck trauma (Age >= 65y), fall from roof EXAM: CT HEAD WITHOUT CONTRAST CT CERVICAL SPINE WITHOUT CONTRAST TECHNIQUE: Multidetector CT imaging of the head and cervical spine was performed following the standard protocol without intravenous contrast. Multiplanar CT image reconstructions of the cervical spine were also generated. RADIATION DOSE REDUCTION: This exam was performed according to the departmental  dose-optimization program which includes automated exposure control, adjustment of the mA and/or kV according to patient size and/or use of iterative reconstruction technique. COMPARISON:  None Available. FINDINGS: CT HEAD FINDINGS Brain: Normal anatomic configuration. Parenchymal volume loss is commensurate with the patient's age. Moderate periventricular white matter changes are present likely reflecting the sequela of small vessel ischemia. Remote left thalamic infarct again noted. No abnormal intra or extra-axial mass lesion or fluid collection. No abnormal mass effect or midline shift. No evidence of acute intracranial hemorrhage or infarct. Ventricular size is normal. Cerebellum unremarkable. Vascular: No asymmetric hyperdense vasculature at the skull base. Skull: Intact Sinuses/Orbits: Right maxillary antrectomy has been performed. Mucous retention cyst noted within the visualized left maxillary sinus. Otherwise, paranasal sinuses are clear. Orbits are unremarkable. Other: Mastoid air cells and middle ear cavities are clear. CT CERVICAL SPINE FINDINGS Alignment: Normal. Skull base and vertebrae: No acute fracture. No primary bone lesion or focal pathologic process. Soft tissues and spinal canal: No prevertebral fluid or swelling. No visible canal hematoma. Posterior disc osteophyte complex at C4-5 and posterior disc bulge at C3-4 in combination with laminar hypertrophy and mild shortening of the pedicles results in mild central canal stenosis with flattening of the thecal sac. Spinal canal  is otherwise widely patent. Disc levels: There is intervertebral disc space narrowing and endplate remodeling of C4-C7 in keeping with changes of mild to moderate degenerative disc disease, most severe at C4-5. Prevertebral soft tissues are not thickened on sagittal reformats. Multilevel uncovertebral and facet arthrosis results in multilevel moderate to severe neuroforaminal narrowing, most severe bilaterally at C4-5, on  the left at C5-6, and bilaterally at C6-7. Upper chest: Negative. Other: None IMPRESSION: 1. No acute intracranial abnormality. No calvarial fracture. 2. Stable senescent change and remote left thalamic infarct. 3. No acute fracture or listhesis of the cervical spine. 4. Multilevel degenerative disc and degenerative joint disease resulting in multilevel moderate to severe neuroforaminal narrowing as described above. Electronically Signed   By: Helyn Numbers M.D.   On: 12/20/2022 21:57   CT Hip Left Wo Contrast  Result Date: 12/20/2022 CLINICAL DATA:  Left hip trauma and pain.  Concern for fracture. EXAM: CT OF THE LEFT HIP WITHOUT CONTRAST TECHNIQUE: Multidetector CT imaging of the left hip was performed according to the standard protocol. Multiplanar CT image reconstructions were also generated. RADIATION DOSE REDUCTION: This exam was performed according to the departmental dose-optimization program which includes automated exposure control, adjustment of the mA and/or kV according to patient size and/or use of iterative reconstruction technique. COMPARISON:  None Available. FINDINGS: Bones/Joint/Cartilage There is a minimally displaced and impacted fracture of the left femoral neck. The bones are mildly osteopenic. There is no dislocation. No joint effusion. Ligaments Suboptimally assessed by CT. Muscles and Tendons No intramuscular fluid collection or hematoma. Soft tissues Sigmoid diverticulosis. IMPRESSION: Minimally displaced and impacted fracture of the left femoral neck. Electronically Signed   By: Elgie Collard M.D.   On: 12/20/2022 21:55   DG Chest Portable 1 View  Result Date: 12/20/2022 CLINICAL DATA:  Fall EXAM: PORTABLE CHEST 1 VIEW COMPARISON:  None Available. FINDINGS: Mild bronchitic changes are seen centrally, new since prior examination. No confluent pulmonary infiltrate. No pneumothorax or pleural effusion. Cardiac size within normal limits. No acute bone abnormality. IMPRESSION: 1.  Mild bronchitic changes. Electronically Signed   By: Helyn Numbers M.D.   On: 12/20/2022 20:36    DISCHARGE EXAMINATION: Vitals:   12/25/22 1611 12/25/22 1932 12/26/22 0447 12/26/22 0734  BP: 130/79 (!) 148/86 137/73 (!) 144/74  Pulse: 96 94 85 80  Resp: 18 18 18    Temp: 98 F (36.7 C) 98.9 F (37.2 C) 98.2 F (36.8 C) 97.7 F (36.5 C)  TempSrc:    Oral  SpO2: 96% 95% 95% 95%  Weight:       General appearance: Awake alert.  In no distress Resp: Clear to auscultation bilaterally.  Normal effort Cardio: S1-S2 is normal regular.  No S3-S4.  No rubs murmurs or bruit GI: Abdomen is soft.  Nontender nondistended.  Bowel sounds are present normal.  No masses organomegaly   DISPOSITION: Home  Discharge Instructions     Call MD for:  difficulty breathing, headache or visual disturbances   Complete by: As directed    Call MD for:  extreme fatigue   Complete by: As directed    Call MD for:  persistant dizziness or light-headedness   Complete by: As directed    Call MD for:  persistant nausea and vomiting   Complete by: As directed    Call MD for:  severe uncontrolled pain   Complete by: As directed    Call MD for:  temperature >100.4   Complete by: As directed  Diet Carb Modified   Complete by: As directed    Discharge instructions   Complete by: As directed    Please follow instructions provided by the orthopedic surgeon.  You were cared for by a hospitalist during your hospital stay. If you have any questions about your discharge medications or the care you received while you were in the hospital after you are discharged, you can call the unit and asked to speak with the hospitalist on call if the hospitalist that took care of you is not available. Once you are discharged, your primary care physician will handle any further medical issues. Please note that NO REFILLS for any discharge medications will be authorized once you are discharged, as it is imperative that you return to  your primary care physician (or establish a relationship with a primary care physician if you do not have one) for your aftercare needs so that they can reassess your need for medications and monitor your lab values. If you do not have a primary care physician, you can call [phone] for a physician referral.   Increase activity slowly   Complete by: As directed          Allergies as of 12/26/2022   No Known Allergies      Medication List     STOP taking these medications    aspirin EC 81 MG tablet Replaced by: aspirin 81 MG chewable tablet   sulfamethoxazole-trimethoprim 800-160 MG tablet Commonly known as: BACTRIM DS       TAKE these medications    aspirin 81 MG chewable tablet Chew 1 tablet (81 mg total) by mouth 2 (two) times daily. Take twice daily for 4 weeks and then once daily as before Replaces: aspirin EC 81 MG tablet   bethanechol 25 MG tablet Commonly known as: URECHOLINE Take 25 mg by mouth daily.   famotidine 20 MG tablet Commonly known as: PEPCID Take 20 mg by mouth daily.   feeding supplement (GLUCERNA SHAKE) Liqd Take 237 mLs by mouth 3 (three) times daily between meals.   finasteride 5 MG tablet Commonly known as: PROSCAR Take 5 mg by mouth at bedtime.   furosemide 20 MG tablet Commonly known as: LASIX Take 20 mg by mouth daily as needed for edema.   gabapentin 600 MG tablet Commonly known as: NEURONTIN Take 600 mg by mouth 2 (two) times daily.   glipiZIDE 10 MG 24 hr tablet Commonly known as: GLUCOTROL XL Take 10 mg by mouth daily.   HYDROcodone-acetaminophen 5-325 MG tablet Commonly known as: NORCO/VICODIN Take 1-2 tablets by mouth every 6 (six) hours as needed for severe pain.   insulin glargine 100 UNIT/ML injection Commonly known as: LANTUS Inject 0.2 mLs (20 Units total) into the skin 2 (two) times daily for 4 days, THEN 0.18 mLs (18 Units total) 2 (two) times daily for 3 days, THEN 0.15 mLs (15 Units total) 2 (two) times daily  for 3 days, THEN 0.12 mLs (12 Units total) 2 (two) times daily for 3 days, THEN 0.08 mLs (8 Units total) 2 (two) times daily for 3 days, THEN 0.05 mLs (5 Units total) 2 (two) times daily for 3 days. Start taking on: June 27, 2019 What changed: See the new instructions.   insulin regular 100 units/mL injection Commonly known as: NOVOLIN R Inject 6 Units into the skin 3 (three) times daily before meals. Inject 2-10 units per sliding scale three times daily before meals: 150-200=2u, 201-250=4u, 251-300=6u, 301-350=8u, 351-400=10   lisinopril-hydrochlorothiazide 20-25  MG tablet Commonly known as: ZESTORETIC Take 1 tablet by mouth daily.   methocarbamol 500 MG tablet Commonly known as: ROBAXIN Take 1 tablet (500 mg total) by mouth every 6 (six) hours as needed for muscle spasms.   multivitamin with minerals Tabs tablet Take 1 tablet by mouth daily.   polyethylene glycol 17 g packet Commonly known as: MIRALAX / GLYCOLAX Take 17 g by mouth daily as needed for mild constipation.   senna 8.6 MG Tabs tablet Commonly known as: SENOKOT Take 1 tablet (8.6 mg total) by mouth 2 (two) times daily.   simvastatin 40 MG tablet Commonly known as: ZOCOR Take 40 mg by mouth daily.   tadalafil 5 MG tablet Commonly known as: CIALIS Take 5 mg by mouth daily.   tamsulosin 0.4 MG Caps capsule Commonly known as: FLOMAX Take 0.4 mg by mouth daily after supper.   Vitamin B-12 2500 MCG Subl Place 2,500 mcg under the tongue daily.          Follow-up Information     Clois Dupes, [place]. Schedule an appointment as soon as possible for a visit in 2 week(s).   Specialty: Orthopedic Surgery Why: For suture removal, For wound re-check Contact information: [address], [address] [place] [address] [phone]         Galvin Proffer, MD Follow up.   Specialty: Internal Medicine Contact information: [address] [place] [address] [phone]         Health,  [facility] Follow up.   Specialty: Home Health Services Why: home health services will be provide [facility], start of care within 48 hours post discharge Contact information: [address] [place] [address] [phone]                 TOTAL DISCHARGE TIME: 35 minutes  Avyn Aden Rito Ehrlich  [facility] Pager on [email or web address]  12/27/2022, 11:19 AM

## 2022-12-26 NOTE — Care Management Important Message (Signed)
Important Message  Patient Details  Name: Luke Edwards MRN: [id number] Date of Birth: [birth year]   Medicare Important Message Given:  Yes     Sherilyn Banker 12/26/2022, 1:11 PM

## 2023-01-31 DIAGNOSIS — M1711 Unilateral primary osteoarthritis, right knee: Secondary | ICD-10-CM

## 2023-01-31 HISTORY — DX: Unilateral primary osteoarthritis, right knee: M17.11

## 2023-03-20 DIAGNOSIS — J90 Pleural effusion, not elsewhere classified: Secondary | ICD-10-CM

## 2023-03-20 DIAGNOSIS — I340 Nonrheumatic mitral (valve) insufficiency: Secondary | ICD-10-CM | POA: Diagnosis not present

## 2023-03-21 DIAGNOSIS — J9691 Respiratory failure, unspecified with hypoxia: Secondary | ICD-10-CM | POA: Diagnosis not present

## 2023-03-21 DIAGNOSIS — I509 Heart failure, unspecified: Secondary | ICD-10-CM | POA: Diagnosis not present

## 2023-03-22 DIAGNOSIS — I959 Hypotension, unspecified: Secondary | ICD-10-CM | POA: Diagnosis not present

## 2023-03-22 DIAGNOSIS — I509 Heart failure, unspecified: Secondary | ICD-10-CM | POA: Diagnosis not present

## 2023-03-22 DIAGNOSIS — J9691 Respiratory failure, unspecified with hypoxia: Secondary | ICD-10-CM | POA: Diagnosis not present

## 2023-03-23 DIAGNOSIS — E039 Hypothyroidism, unspecified: Secondary | ICD-10-CM

## 2023-03-23 DIAGNOSIS — I509 Heart failure, unspecified: Secondary | ICD-10-CM | POA: Diagnosis not present

## 2023-03-23 DIAGNOSIS — J9691 Respiratory failure, unspecified with hypoxia: Secondary | ICD-10-CM | POA: Diagnosis not present

## 2023-03-23 DIAGNOSIS — I959 Hypotension, unspecified: Secondary | ICD-10-CM | POA: Diagnosis not present

## 2023-03-23 HISTORY — DX: Hypothyroidism, unspecified: E03.9

## 2023-03-24 ENCOUNTER — Other Ambulatory Visit: Payer: Self-pay

## 2023-03-24 ENCOUNTER — Ambulatory Visit: Payer: Medicare HMO

## 2023-03-24 VITALS — BP 98/68 | HR 88 | Ht 74.0 in | Wt 202.6 lb

## 2023-03-24 DIAGNOSIS — N1832 Chronic kidney disease, stage 3b: Secondary | ICD-10-CM | POA: Diagnosis not present

## 2023-03-24 DIAGNOSIS — N183 Chronic kidney disease, stage 3 unspecified: Secondary | ICD-10-CM | POA: Insufficient documentation

## 2023-03-24 DIAGNOSIS — I509 Heart failure, unspecified: Secondary | ICD-10-CM

## 2023-03-24 DIAGNOSIS — I5022 Chronic systolic (congestive) heart failure: Secondary | ICD-10-CM

## 2023-03-24 DIAGNOSIS — I10 Essential (primary) hypertension: Secondary | ICD-10-CM | POA: Diagnosis not present

## 2023-03-24 DIAGNOSIS — I340 Nonrheumatic mitral (valve) insufficiency: Secondary | ICD-10-CM

## 2023-03-24 HISTORY — DX: Heart failure, unspecified: I50.9

## 2023-03-24 HISTORY — DX: Nonrheumatic mitral (valve) insufficiency: I34.0

## 2023-03-24 HISTORY — DX: Chronic kidney disease, stage 3 unspecified: N18.30

## 2023-03-24 MED ORDER — DAPAGLIFLOZIN PROPANEDIOL 10 MG PO TABS: 10.0000 mg | ORAL_TABLET | Freq: Every day | ORAL | 0 refills | Status: DC

## 2023-03-24 MED ORDER — DAPAGLIFLOZIN PROPANEDIOL 10 MG PO TABS: 10.0000 mg | ORAL_TABLET | Freq: Every day | ORAL | 0 refills | Status: AC

## 2023-03-24 NOTE — Assessment & Plan Note (Signed)
Appears euvolemic compensated. NYHA class II  Continue Lasix 20 mg once daily. Keep salt below 2 g/day and fluid below 2 L/day.  Further evaluation for cardiomyopathy reviewed at length. Options for invasive versus noninvasive methods reviewed. Cardiac PET/CT stress test versus cardiac catheterization discussed in the setting. Given his risk factors for CAD, degree of LV dysfunction, decided to proceed with definitive assessment.  Shared Decision Making/Informed Consent{ The risks [stroke (1 in 1000), death (1 in 1000), kidney failure [usually temporary] (1 in 500), bleeding (1 in 200), allergic reaction [possibly serious] (1 in 200)], benefits (diagnostic support and management of coronary artery disease) and alternatives of a cardiac catheterization were discussed in detail with him and his family members and they are willing to proceed.  Will schedule for cardiac cath at [facility].   Continue metoprolol succinate 25 mg once daily Continue Farxiga 10 mg once daily. If blood pressures and renal function remain stable postcardiac catheterization, will consider titrating up guideline directed medical therapy.

## 2023-03-24 NOTE — Patient Instructions (Addendum)
Lab Work: Today you will have a CMP, CBC and a lipid panel.  If you have labs (blood work) drawn today and your tests are completely normal, you will receive your results only by: MyChart Message (if you have MyChart) OR A paper copy in the mail If you have any lab test that is abnormal or we need to change your treatment, we will call you to review the results.   Testing/Procedures:    Luke Edwards  03/24/2023  You are scheduled for a Cardiac Catheterization on Tuesday, December 17 with Dr. Peter [place].  1. Please arrive at the [facility] (Main Entrance A) at [facility]: [address] [place], [place] [address] at 10:00 AM (This time is 12:00 hour(s) before your procedure to ensure your preparation).   Free valet parking service is available. You will check in at ADMITTING. The support person will be asked to wait in the waiting room.  It is OK to have someone drop you off and come back when you are ready to be discharged.    Special note: Every effort is made to have your procedure done on time. Please understand that emergencies sometimes delay scheduled procedures.  2. Diet: Do not eat solid foods after midnight.  The patient may have clear liquids until 5am upon the day of the procedure.   4. Medication instructions in preparation for your procedure:   Contrast Allergy: No     Stop taking, Lasix (Furosemide)  Monday, December 16, do not take the morning of.   Take 1/2 of your dose of insulin the night before, do not take any the morning of.  Do not take Dapagliflozin the day before or day of.   On the morning of your procedure, take your Aspirin 81 mg and any morning medicines NOT listed above.  You may use sips of water.  5. Plan to go home the same day, you will only stay overnight if medically necessary. 6. Bring a current list of your medications and current insurance cards. 7. You MUST have a responsible person to drive you home. 8. Someone MUST  be with you the first 24 hours after you arrive home or your discharge will be delayed. 9. Please wear clothes that are easy to get on and off and wear slip-on shoes.  Thank you for allowing [place] to care for you!   -- [name]  Medication Instructions:  Your physician has recommended you make the following change in your medication:   Take 81 mg coated aspirin daily.   *If you need a refill on your cardiac medications before your next appointment, please call your pharmacy*        Follow-Up: At [facility], you and your health needs are our priority.  As part of our continuing mission to provide you with exceptional heart care, we have created designated Provider Care Teams.  These Care Teams include your primary Cardiologist (physician) and Advanced Practice Providers (APPs -  Physician Assistants and Nurse Practitioners) who all work together to provide you with the care you need, when you need it.  We recommend signing up for the patient portal called "MyChart".  Sign up information is provided on this After Visit Summary.  MyChart is used to connect with patients for Virtual Visits (Telemedicine).  Patients are able to view lab/test results, encounter notes, upcoming appointments, etc.  Non-urgent messages can be sent to your provider as well.   To learn more about  what you can do with MyChart, go to [email or web address].    Your next appointment:   Coronary Angiogram With Stent Coronary angiogram with stent placement is a procedure to widen or open a narrow blood vessel of the heart (coronary artery). Arteries may become blocked by cholesterol buildup (plaques) in the lining of the artery wall. When a coronary artery becomes partially blocked, blood flow to that area decreases. This may lead to chest pain or a heart attack (myocardial infarction). A stent is a small piece of metal that looks like mesh or spring. Stent placement may be done as  treatment after a heart attack, or to prevent a heart attack if a blocked artery is found by a coronary angiogram. Let your health care provider know about: Any allergies you have, including allergies to medicines or contrast dye. All medicines you are taking, including vitamins, herbs, eye drops, creams, and over-the-counter medicines. Any problems you or family members have had with anesthetic medicines. Any blood disorders you have. Any surgeries you have had. Any medical conditions you have, including kidney problems or kidney failure. Whether you are pregnant or may be pregnant. Whether you are breastfeeding. What are the risks? Generally, this is a safe procedure. However, serious problems may occur, including: Damage to nearby structures or organs, such as the heart, blood vessels, or kidneys. A return of blockage. Bleeding, infection, or bruising at the insertion site. A collection of blood under the skin (hematoma) at the insertion site. A blood clot in another part of the body. Allergic reaction to medicines or dyes. Bleeding into the abdomen (retroperitoneal bleeding). Stroke (rare). Heart attack (rare). What happens before the procedure? Staying hydrated Follow instructions from your health care provider about hydration, which may include: Up to 2 hours before the procedure - you may continue to drink clear liquids, such as water, clear fruit juice, black coffee, and plain tea.    Eating and drinking restrictions Follow instructions from your health care provider about eating and drinking, which may include: 8 hours before the procedure - stop eating heavy meals or foods, such as meat, fried foods, or fatty foods. 6 hours before the procedure - stop eating light meals or foods, such as toast or cereal. 2 hours before the procedure - stop drinking clear liquids. Medicines Ask your health care provider about: Changing or stopping your regular medicines. This is especially  important if you are taking diabetes medicines or blood thinners. Taking medicines such as aspirin and ibuprofen. These medicines can thin your blood. Do not take these medicines unless your health care provider tells you to take them. Generally, aspirin is recommended before a thin tube, called a catheter, is passed through a blood vessel and inserted into the heart (cardiac catheterization). Taking over-the-counter medicines, vitamins, herbs, and supplements. General instructions Do not use any products that contain nicotine or tobacco for at least 4 weeks before the procedure. These products include cigarettes, e-cigarettes, and chewing tobacco. If you need help quitting, ask your health care provider. Plan to have someone take you home from the hospital or clinic. If you will be going home right after the procedure, plan to have someone with you for 24 hours. You may have tests and imaging procedures. Ask your health care provider: How your insertion site will be marked. Ask which artery will be used for the procedure. What steps will be taken to help prevent infection. These may include: Removing hair at the insertion site. Washing skin with a germ-killing soap.  Taking antibiotic medicine. What happens during the procedure? An IV will be inserted into one of your veins. Electrodes may be placed on your chest to monitor your heart rate during the procedure. You will be given one or more of the following: A medicine to help you relax (sedative). A medicine to numb the area (local anesthetic) for catheter insertion. A small incision will be made for catheter insertion. The catheter will be inserted into an artery using a guide wire. The location may be in your groin, your wrist, or the fold of your arm (near your elbow). An X-ray procedure (fluoroscopy) will be used to help guide the catheter to the opening of the heart arteries. A dye will be injected into the catheter. X-rays will be  taken. The dye helps to show where any narrowing or blockages are located in the arteries. Tell your health care provider if you have chest pain or trouble breathing. A tiny wire will be guided to the blocked spot, and a balloon will be inflated to make the artery wider. The stent will be expanded to crush the plaques into the wall of the vessel. The stent will hold the area open and improve the blood flow. Most stents have a drug coating to reduce the risk of the stent narrowing over time. The artery may be made wider using a drill, laser, or other tools that remove plaques. The catheter will be removed when the blood flow improves. The stent will stay where it was placed, and the lining of the artery will grow over it. A bandage (dressing) will be placed on the insertion site. Pressure will be applied to stop bleeding. The IV will be removed. This procedure may vary among health care providers and hospitals.    What happens after the procedure? Your blood pressure, heart rate, breathing rate, and blood oxygen level will be monitored until you leave the hospital or clinic. If the procedure is done through the leg, you will lie flat in bed for a few hours or for as long as told by your health care provider. You will be instructed not to bend or cross your legs. The insertion site and the pulse in your foot or wrist will be checked often. You may have more blood tests, X-rays, and a test that records the electrical activity of your heart (electrocardiogram, or ECG). Do not drive for 24 hours if you were given a sedative during your procedure. Summary Coronary angiogram with stent placement is a procedure to widen or open a narrowed coronary artery. This is done to treat heart problems. Before the procedure, let your health care provider know about all the medical conditions and surgeries you have or have had. This is a safe procedure. However, some problems may occur, including damage to nearby  structures or organs, bleeding, blood clots, or allergies. Follow your health care provider's instructions about eating, drinking, medicines, and other lifestyle changes, such as quitting tobacco use before the procedure. This information is not intended to replace advice given to you by your health care provider. Make sure you discuss any questions you have with your health care provider. Document Revised: 10/17/2018 Document Reviewed: 10/17/2018 Elsevier Patient Education  2021 Elsevier Inc.  Aspirin and Your Heart Aspirin is a medicine that prevents the platelets in your blood from sticking together. Platelets are the cells that your blood uses for clotting. Aspirin can be used to help reduce the risk of blood clots, heart attacks, and other heart-related problems. What  are the risks? Daily use of aspirin can cause side effects. Some of these include: Bleeding. Bleeding can be minor or serious. An example of minor bleeding is bleeding from a cut, and the bleeding does not stop. An example of more serious bleeding is stomach bleeding or, rarely, bleeding into the brain. Your risk of bleeding increases if you are also taking NSAIDs, such as ibuprofen. Increased bruising. Upset stomach. An allergic reaction. People who have growths inside the nose (nasal polyps) have an increased risk of developing an aspirin allergy. How to use aspirin to care for your heart Take aspirin only as told by your health care provider. Make sure that you understand how much to take and what form to take. The two forms of aspirin are: Non-enteric-coated.This type of aspirin does not have a coating and is absorbed quickly. This type of aspirin also comes in a chewable form. Enteric-coated. This type of aspirin has a coating that releases the medicine very slowly. Enteric-coated aspirin might cause less stomach upset than non-enteric-coated aspirin. This type of aspirin should not be chewed or crushed. Work with your  health care provider to find out whether it is safe and beneficial for you to take aspirin daily. Taking aspirin daily may be helpful if: You have had a heart attack or chest pain, or you are at risk for a heart attack. You have a condition in which certain heart vessels are blocked (coronary artery disease), and you have had a procedure to treat it. Examples are: Open-heart surgery, such as coronary artery bypass surgery (CABG). Coronary angioplasty,which is done to widen a blood vessel of your heart. Having a small mesh tube, or stent, placed in your coronary artery. You have had certain types of stroke or a mini-stroke known as a transient ischemic attack (TIA). You have a narrowing of the arteries that supply the limbs (peripheral artery disease, or PAD). You have long-term (chronic) heart rhythm problems, such as atrial fibrillation, and your health care provider thinks aspirin may help. You have valve disease or have had surgery on a valve. You are considered at increased risk of developing coronary artery disease or PAD.    Follow these instructions at home Medicines Take over-the-counter and prescription medicines only as told by your health care provider. If you are taking blood thinners: Talk with your health care provider before you take any medicines that contain aspirin or NSAIDs, such as ibuprofen. These medicines increase your risk for dangerous bleeding. Take your medicine exactly as told, at the same time every day. Avoid activities that could cause injury or bruising, and follow instructions about how to prevent falls. Wear a medical alert bracelet or carry a card that lists what medicines you take. General instructions Do not drink alcohol if: Your health care provider tells you not to drink. You are pregnant, may be pregnant, or are planning to become pregnant. If you drink alcohol: Limit how much you use to: 0-1 drink a day for women. 0-2 drinks a day for men. Be  aware of how much alcohol is in your drink. In the [place] one drink equals one 12 oz bottle of beer (355 mL), one 5 oz glass of wine (148 mL), or one 1 oz glass of hard liquor (44 mL). Keep all follow-up visits as told by your health care provider. This is important. Where to find more information The American Heart Association: www.heart.org Contact a health care provider if you have: Unusual bleeding or bruising. Stomach pain or  nausea. Ringing in your ears. An allergic reaction that causes hives, itchy skin, or swelling of the lips, tongue, or face. Get help right away if: You notice that your bowel movements are bloody, or dark red or black in color. You vomit or cough up blood. You have blood in your urine. You cough, breathe loudly (wheeze), or feel short of breath. You have chest pain, especially if the pain spreads to your arms, back, neck, or jaw. You have a headache with confusion. You have any symptoms of a stroke. "BE FAST" is an easy way to remember the main warning signs of a stroke: B - Balance. Signs are dizziness, sudden trouble walking, or loss of balance. E - Eyes. Signs are trouble seeing or a sudden change in vision. F - Face. Signs are sudden weakness or numbness of the face, or the face or eyelid drooping on one side. A - Arms. Signs are weakness or numbness in an arm. This happens suddenly and usually on one side of the body. S - Speech. Signs are sudden trouble speaking, slurred speech, or trouble understanding what people say. T - Time. Time to call emergency services. Write down what time symptoms started. You have other signs of a stroke, such as: A sudden, severe headache with no known cause. Nausea or vomiting. Seizure. These symptoms may represent a serious problem that is an emergency. Do not wait to see if the symptoms will go away. Get medical help right away. Call your local emergency services (911 in the [place] Do not drive yourself to the  hospital. Summary Aspirin use can help reduce the risk of blood clots, heart attacks, and other heart-related problems. Daily use of aspirin can cause side effects. Take aspirin only as told by your health care provider. Make sure that you understand how much to take and what form to take. Your health care provider will help you determine whether it is safe and beneficial for you to take aspirin daily. This information is not intended to replace advice given to you by your health care provider. Make sure you discuss any questions you have with your health care provider. Document Revised: 12/31/2018 Document Reviewed: 12/31/2018 Elsevier Patient Education  2021 Elsevier Inc. Nitroglycerin sublingual tablets What is this medicine? NITROGLYCERIN (nye troe GLI ser in) is a type of vasodilator. It relaxes blood vessels, increasing the blood and oxygen supply to your heart. This medicine is used to relieve chest pain caused by angina. It is also used to prevent chest pain before activities like climbing stairs, going outdoors in cold weather, or sexual activity. This medicine may be used for other purposes; ask your health care provider or pharmacist if you have questions. COMMON BRAND NAME(S): Nitroquick, Nitrostat, Nitrotab What should I tell my health care provider before I take this medicine? They need to know if you have any of these conditions: anemia head injury, recent stroke, or bleeding in the brain liver disease previous heart attack an unusual or allergic reaction to nitroglycerin, other medicines, foods, dyes, or preservatives pregnant or trying to get pregnant breast-feeding How should I use this medicine? Take this medicine by mouth as needed. Use at the first sign of an angina attack (chest pain or tightness). You can also take this medicine 5 to 10 minutes before an event likely to produce chest pain. Follow the directions exactly as written on the prescription label. Place one  tablet under your tongue and let it dissolve. Do not swallow whole. Replace the  dose if you accidentally swallow it. It will help if your mouth is not dry. Saliva around the tablet will help it to dissolve more quickly. Do not eat or drink, smoke or chew tobacco while a tablet is dissolving. Sit down when taking this medicine. In an angina attack, you should feel better within 5 minutes after your first dose. You can take a dose every 5 minutes up to a total of 3 doses. If you do not feel better or feel worse after 1 dose, call 9-1-1 at once. Do not take more than 3 doses in 15 minutes. Your health care provider might give you other directions. Follow those directions if he or she does. Do not take your medicine more often than directed. Talk to your health care provider about the use of this medicine in children. Special care may be needed. Overdosage: If you think you have taken too much of this medicine contact a poison control center or emergency room at once. NOTE: This medicine is only for you. Do not share this medicine with others. What if I miss a dose? This does not apply. This medicine is only used as needed. What may interact with this medicine? Do not take this medicine with any of the following medications: certain migraine medicines like ergotamine and dihydroergotamine (DHE) medicines used to treat erectile dysfunction like sildenafil, tadalafil, and vardenafil riociguat This medicine may also interact with the following medications: alteplase aspirin heparin medicines for high blood pressure medicines for mental depression other medicines used to treat angina phenothiazines like chlorpromazine, mesoridazine, prochlorperazine, thioridazine This list may not describe all possible interactions. Give your health care provider a list of all the medicines, herbs, non-prescription drugs, or dietary supplements you use. Also tell them if you smoke, drink alcohol, or use illegal drugs.  Some items may interact with your medicine. What should I watch for while using this medicine? Tell your doctor or health care professional if you feel your medicine is no longer working. Keep this medicine with you at all times. Sit or lie down when you take your medicine to prevent falling if you feel dizzy or faint after using it. Try to remain calm. This will help you to feel better faster. If you feel dizzy, take several deep breaths and lie down with your feet propped up, or bend forward with your head resting between your knees. You may get drowsy or dizzy. Do not drive, use machinery, or do anything that needs mental alertness until you know how this drug affects you. Do not stand or sit up quickly, especially if you are an older patient. This reduces the risk of dizzy or fainting spells. Alcohol can make you more drowsy and dizzy. Avoid alcoholic drinks. Do not treat yourself for coughs, colds, or pain while you are taking this medicine without asking your doctor or health care professional for advice. Some ingredients may increase your blood pressure. What side effects may I notice from receiving this medicine? Side effects that you should report to your doctor or health care professional as soon as possible: allergic reactions (skin rash, itching or hives; swelling of the face, lips, or tongue) low blood pressure (dizziness; feeling faint or lightheaded, falls; unusually weak or tired) low red blood cell counts (trouble breathing; feeling faint; lightheaded, falls; unusually weak or tired) Side effects that usually do not require medical attention (report to your doctor or health care professional if they continue or are bothersome): facial flushing (redness) headache nausea, vomiting This  list may not describe all possible side effects. Call your doctor for medical advice about side effects. You may report side effects to FDA at 1-800-FDA-1088. Where should I keep my medicine? Keep out  of the reach of children. Store at room temperature between 20 and 25 degrees C (68 and 77 degrees F). Store in Retail buyer. Protect from light and moisture. Keep tightly closed. Throw away any unused medicine after the expiration date. NOTE: This sheet is a summary. It may not cover all possible information. If you have questions about this medicine, talk to your doctor, pharmacist, or health care provider.  2021 Elsevier/Gold Standard (2017-12-27 16:46:32)   Follow-Up: At [facility], you and your health needs are our priority.  As part of our continuing mission to provide you with exceptional heart care, we have created designated Provider Care Teams.  These Care Teams include your primary Cardiologist (physician) and Advanced Practice Providers (APPs -  Physician Assistants and Nurse Practitioners) who all work together to provide you with the care you need, when you need it.  We recommend signing up for the patient portal called "MyChart".  Sign up information is provided on this After Visit Summary.  MyChart is used to connect with patients for Virtual Visits (Telemedicine).  Patients are able to view lab/test results, encounter notes, upcoming appointments, etc.  Non-urgent messages can be sent to your provider as well.   To learn more about what you can do with MyChart, go to [email or web address].     4 week follow up with Dr. Cannon Kettle

## 2023-03-24 NOTE — Assessment & Plan Note (Signed)
Aware of contrast-induced nephropathy risk. Willing to proceed. Renal protection with saline infusion preprocedure as per protocol from the Cath Lab.  Labs at discharge on 03/23/2023 noted sodium 139, potassium 3.7, BUN 19, creatinine 1.6, EGFR 43 Hemoglobin 11.4, hematocrit 33.8, platelets 324, WBC 9.1

## 2023-03-24 NOTE — Assessment & Plan Note (Signed)
Follow-up with annual echocardiogram.  Tentatively next September.

## 2023-03-24 NOTE — Assessment & Plan Note (Signed)
Remains at mildly hypotensive with systolic 98. Limiting titrating up of the medications for guideline directed medical therapy.

## 2023-03-24 NOTE — H&P (View-Only) (Signed)
Cardiology Consultation:    Date:  03/24/2023   ID:  Luke Edwards, DOB [birth year], MRN [id number]  PCP:  Galvin Proffer, MD  Cardiologist:  Marlyn Corporal Corneisha Alvi, MD   Referring MD: Galvin Proffer, MD   Chief Complaint  Patient presents with   heart cath     ASSESSMENT AND PLAN:   Luke Edwards 82 year old male with history of CHF, reduced LVEF 35 to 40% with diffuse hypokinesis, CKD stage III, diabetes mellitus, hypertension, moderate eccentric MR, BPH, history of CVA, left hip surgery 3 months ago  Problem List Items Addressed This Visit     Essential hypertension - Primary   Remains at mildly hypotensive with systolic 98. Limiting titrating up of the medications for guideline directed medical therapy.      Relevant Medications   atorvastatin (LIPITOR) 20 MG tablet   metoprolol succinate (TOPROL-XL) 25 MG 24 hr tablet   Other Relevant Orders   EKG 12-Lead (Completed)   CHF (congestive heart failure) (HCC), both reduced LVEF 35 to 40% by echocardiogram December 2024 at [facility] euvolemic compensated. NYHA class II  Continue Lasix 20 mg once daily. Keep salt below 2 g/day and fluid below 2 L/day.  Further evaluation for cardiomyopathy reviewed at length. Options for invasive versus noninvasive methods reviewed. Cardiac PET/CT stress test versus cardiac catheterization discussed in the setting. Given his risk factors for CAD, degree of LV dysfunction, decided to proceed with definitive assessment.  Shared Decision Making/Informed Consent{ The risks [stroke (1 in 1000), death (1 in 1000), kidney failure [usually temporary] (1 in 500), bleeding (1 in 200), allergic reaction [possibly serious] (1 in 200)], benefits (diagnostic support and management of coronary artery disease) and alternatives of a cardiac catheterization were discussed in detail with him and his family members and they are willing to proceed.  Will schedule for cardiac cath at [facility].   Continue metoprolol succinate 25 mg once daily Continue Farxiga 10 mg once daily. If blood pressures and renal function remain stable postcardiac catheterization, will consider titrating up guideline directed medical therapy.         Relevant Medications   atorvastatin (LIPITOR) 20 MG tablet   metoprolol succinate (TOPROL-XL) 25 MG 24 hr tablet   Moderate mitral regurgitation, eccentric appearing on transthoracic echo 03/20/2023 at [facility]   Follow-up with annual echocardiogram.  Tentatively next September.      Relevant Medications   atorvastatin (LIPITOR) 20 MG tablet   metoprolol succinate (TOPROL-XL) 25 MG 24 hr tablet   CKD (chronic kidney disease), stage III (HCC)   Aware of contrast-induced nephropathy risk. Willing to proceed. Renal protection with saline infusion preprocedure as per protocol from the Cath Lab.  Labs at discharge on 03/23/2023 noted sodium 139, potassium 3.7, BUN 19, creatinine 1.6, EGFR 43 Hemoglobin 11.4, hematocrit 33.8, platelets 324, WBC 9.1      Return to clinic tentatively in 4 weeks.   History of Present Illness:    Luke Edwards is a 82 y.o. male who is being seen today for further evaluation and follow-up of CHF, cardiomyopathy recently diagnosed at [facility] during inpatient stay. PCP [name], [name], MD.  Here for the visit today accompanied by his wife and daughter.  Pleasant gentleman has a history of CHF with reduced LVEF 35 to 40% new diagnosis by echocardiogram December 2024, diabetes mellitus, hypertension, CKD stage II/III, moderate eccentric mitral regurgitation, BPH, history of CVA, left hip surgery  3 months ago.  Recently was admitted and discharged from [facility] between 03/19/2023 through 03/23/2023. Was reportedly having increased bilateral lower extremity and worsening shortness of breath over the past few months, poor response to Lasix initiated by PCP  presented with hypoxia to the ER.  Workup with chest x-ray showed bilateral pleural effusion, D-dimer was elevated, CTA chest ruled out PE.  Underwent ultrasound-guided thoracentesis left side on 03/20/2023 and right side on 03/21/2023, echocardiogram noted moderate LV dysfunction with diffuse hypokinesis EF 35 to 40%, moderate eccentric MR.  proBNP was elevated 1800.  Troponins were flat at 0.06, 0.06, 0.07.  Symptom improved with diuresis and thoracentesis. Attempts to optimize guideline directed medical therapy [was given carvedilol, Farxiga, spironolactone, Entresto] and continued diuresis were limited due to soft blood pressures and mildly uptrending creatinine. He was continued on for CR 10 mg once daily and started on low-dose metoprolol succinate 25 mg once daily at discharge and resume low-dose furosemide 20 mg once a day.  Labs at discharge on 03/23/2023 noted sodium 139, potassium 3.7, BUN 19, creatinine 1.6, EGFR 43 Hemoglobin 11.4, hematocrit 33.8, platelets 324, WBC 9.1.  While at home. No new symptoms. At discharge he was sent home on Farxiga 5 mg once a day.  He is requesting prescription for the 10 mg once a day.  No worsening pedal edema. No orthopnea, paroxysmal nocturnal dyspnea.  EKG in the clinic today shows sinus rhythm heart rate 88/min,No significant ST-T changes.  Transfer text. In comparison prior EKG from 03/19/2023 at [facility] ER noted sinus tachycardia with anteroseptal Q waves.  Past Medical History:  Diagnosis Date   COVID-19    Diabetes mellitus without complication (HCC)    GERD (gastroesophageal reflux disease)    HLD (hyperlipidemia)    Hypertension     Past Surgical History:  Procedure Laterality Date   APPENDECTOMY     CHOLECYSTECTOMY     TOTAL HIP ARTHROPLASTY Left 12/23/2022   Procedure: TOTAL HIP ARTHROPLASTY ANTERIOR APPROACH;  Surgeon: Samson Frederic, MD;  Location: [facility] OR;  Service: Orthopedics;  Laterality: Left;    Current  Medications: Current Meds  Medication Sig   aspirin 81 MG chewable tablet Chew 1 tablet (81 mg total) by mouth 2 (two) times daily. Take twice daily for 4 weeks and then once daily as before   atorvastatin (LIPITOR) 20 MG tablet Take 20 mg by mouth daily.   bethanechol (URECHOLINE) 25 MG tablet Take 25 mg by mouth daily.   Cyanocobalamin (VITAMIN B-12) 2500 MCG SUBL Place 2,500 mcg under the tongue daily.   feeding supplement, GLUCERNA SHAKE, (GLUCERNA SHAKE) LIQD Take 237 mLs by mouth 3 (three) times daily between meals.   finasteride (PROSCAR) 5 MG tablet Take 5 mg by mouth at bedtime.    fluticasone (FLONASE) 50 MCG/ACT nasal spray Place 1 spray into both nostrils daily.   furosemide (LASIX) 20 MG tablet Take 20 mg by mouth daily as needed for edema.   gabapentin (NEURONTIN) 600 MG tablet Take 600 mg by mouth 2 (two) times daily.   insulin glargine (LANTUS) 100 UNIT/ML injection Inject 0.2 mLs (20 Units total) into the skin 2 (two) times daily for 4 days, THEN 0.18 mLs (18 Units total) 2 (two) times daily for 3 days, THEN 0.15 mLs (15 Units total) 2 (two) times daily for 3 days, THEN 0.12 mLs (12 Units total) 2 (two) times daily for 3 days, THEN 0.08 mLs (8 Units total) 2 (two) times daily for 3  days, THEN 0.05 mLs (5 Units total) 2 (two) times daily for 3 days. (Patient taking differently: Inject 65 units into the skin at night)   insulin regular (NOVOLIN R) 100 units/mL injection Inject 6 Units into the skin 3 (three) times daily before meals. Inject 2-10 units per sliding scale three times daily before meals: 150-200=2u, 201-250=4u, 251-300=6u, 301-350=8u, 351-400=10   loratadine (CLARITIN) 10 MG tablet Take 10 mg by mouth daily.   metoprolol succinate (TOPROL-XL) 25 MG 24 hr tablet Take 25 mg by mouth daily.   Multiple Vitamin (MULTIVITAMIN WITH MINERALS) TABS tablet Take 1 tablet by mouth daily.   tamsulosin (FLOMAX) 0.4 MG CAPS capsule Take 0.4 mg by mouth daily after supper.     [DISCONTINUED] famotidine (PEPCID) 20 MG tablet Take 20 mg by mouth daily.   [DISCONTINUED] glipiZIDE (GLUCOTROL XL) 10 MG 24 hr tablet Take 10 mg by mouth daily.    [DISCONTINUED] HYDROcodone-acetaminophen (NORCO/VICODIN) 5-325 MG tablet Take 1-2 tablets by mouth every 6 (six) hours as needed for severe pain.   [DISCONTINUED] lisinopril-hydrochlorothiazide (PRINZIDE,ZESTORETIC) 20-25 MG per tablet Take 1 tablet by mouth daily.   [DISCONTINUED] methocarbamol (ROBAXIN) 500 MG tablet Take 1 tablet (500 mg total) by mouth every 6 (six) hours as needed for muscle spasms.   [DISCONTINUED] polyethylene glycol (MIRALAX / GLYCOLAX) 17 g packet Take 17 g by mouth daily as needed for mild constipation.   [DISCONTINUED] senna (SENOKOT) 8.6 MG TABS tablet Take 1 tablet (8.6 mg total) by mouth 2 (two) times daily.   [DISCONTINUED] simvastatin (ZOCOR) 40 MG tablet Take 40 mg by mouth daily.   [DISCONTINUED] tadalafil (CIALIS) 5 MG tablet Take 5 mg by mouth daily.     Allergies:   Levofloxacin and Prednisone   Social History   Socioeconomic History   Marital status: Married    Spouse name: Velna Hatchet   Number of children: 2   Years of education: 11   Highest education level: Not on file  Occupational History   Not on file  Tobacco Use   Smoking status: Never   Smokeless tobacco: Never  Vaping Use   Vaping status: Never Used  Substance and Sexual Activity   Alcohol use: Yes    Alcohol/week: 2.0 standard drinks of alcohol    Types: 2 Cans of beer per week    Comment: Once during the week    Drug use: No   Sexual activity: Not on file  Other Topics Concern   Not on file  Social History Narrative   Right handed   Lives in one story and lives with wife and daughter   Social Drivers of Health   Financial Resource Strain: Not on file  Food Insecurity: Low Risk  (02/03/2023)   Received from [facility]   Hunger Vital Sign    Worried About Running Out of Food in the Last Year: Never true    Ran  Out of Food in the Last Year: Never true  Transportation Needs: No Transportation Needs (02/03/2023)   Received from [facility]    In the past 12 months, has lack of reliable transportation kept you from medical appointments, meetings, work or from getting things needed for daily living? : No  Physical Activity: Not on file  Stress: Not on file  Social Connections: Not on file     Family History: The patient's family history includes Diabetes in his daughter and mother; Heart attack in his father; Valvular heart disease in his mother. ROS:  Please see the history of present illness.    All 14 point review of systems negative except as described per history of present illness.  EKGs/Labs/Other Studies Reviewed:    The following studies were reviewed today:   EKG:  EKG Interpretation Date/Time:  Friday March 24 2023 14:34:47 EST Ventricular Rate:  88 PR Interval:  156 QRS Duration:  94 QT Interval:  398 QTC Calculation: 481 R Axis:   -22  Text Interpretation: Normal sinus rhythm When compared with ECG of 21-Dec-2022 05:44, Questionable change in initial forces of Septal leads Confirmed by Huntley Dec reddy [phone]) on 03/24/2023 1:44:52 PM    Recent Labs: 12/21/2022: ALT 17 12/24/2022: Magnesium 1.8 12/26/2022: BUN 16; Creatinine, Ser 1.26; Hemoglobin 9.4; Platelets 168; Potassium 4.0; Sodium 134  Recent Lipid Panel    Component Value Date/Time   TRIG 93 04/29/2019 1439    Physical Exam:    VS:  BP 98/68 (BP Location: Left Arm, Patient Position: Sitting)   Pulse 88   Ht 6\' 2"  (1.88 m)   Wt 202 lb 9.6 oz (91.9 kg)   SpO2 98%   BMI 26.01 kg/m     Wt Readings from Last 3 Encounters:  03/24/23 202 lb 9.6 oz (91.9 kg)  12/23/22 215 lb 2.7 oz (97.6 kg)  11/27/19 206 lb (93.4 kg)     GENERAL:  Well nourished, well developed in no acute distress NECK: No JVD; No carotid bruits CARDIAC: RRR, S1 and S2 present, 4 x 6 holosystolic murmur best  heard parasternally left side.   CHEST:  Clear to auscultation without rales, wheezing or rhonchi  Extremities: No pitting pedal edema. Pulses bilaterally symmetric with radial 2+ and dorsalis pedis 2+ NEUROLOGIC:  Alert and oriented x 3  Medication Adjustments/Labs and Tests Ordered: Current medicines are reviewed at length with the patient today.  Concerns regarding medicines are outlined above.  Orders Placed This Encounter  Procedures   EKG 12-Lead   No orders of the defined types were placed in this encounter.   Signed, Cecille Amsterdam, MD, MPH, [facility]. 03/24/2023 2:15 PM     [facility]

## 2023-03-24 NOTE — Progress Notes (Signed)
Cardiology Consultation:    Date:  03/24/2023   ID:  Luke Edwards, DOB [birth year], MRN [id number]  PCP:  Luke Proffer, MD  Cardiologist:  Luke Corporal Corneisha Alvi, MD   Referring MD: Luke Proffer, MD   Chief Complaint  Patient presents with   heart cath     ASSESSMENT AND PLAN:   Luke Edwards 82 year old male with history of CHF, reduced LVEF 35 to 40% with diffuse hypokinesis, CKD stage III, diabetes mellitus, hypertension, moderate eccentric MR, BPH, history of CVA, left hip surgery 3 months ago  Problem List Items Addressed This Visit     Essential hypertension - Primary   Remains at mildly hypotensive with systolic 98. Limiting titrating up of the medications for guideline directed medical therapy.      Relevant Medications   atorvastatin (LIPITOR) 20 MG tablet   metoprolol succinate (TOPROL-XL) 25 MG 24 hr tablet   Other Relevant Orders   EKG 12-Lead (Completed)   CHF (congestive heart failure) (HCC), both reduced LVEF 35 to 40% by echocardiogram December 2024 at [facility] euvolemic compensated. NYHA class II  Continue Lasix 20 mg once daily. Keep salt below 2 g/day and fluid below 2 L/day.  Further evaluation for cardiomyopathy reviewed at length. Options for invasive versus noninvasive methods reviewed. Cardiac PET/CT stress test versus cardiac catheterization discussed in the setting. Given his risk factors for CAD, degree of LV dysfunction, decided to proceed with definitive assessment.  Shared Decision Making/Informed Consent{ The risks [stroke (1 in 1000), death (1 in 1000), kidney failure [usually temporary] (1 in 500), bleeding (1 in 200), allergic reaction [possibly serious] (1 in 200)], benefits (diagnostic support and management of coronary artery disease) and alternatives of a cardiac catheterization were discussed in detail with him and his family members and they are willing to proceed.  Will schedule for cardiac cath at [facility].   Continue metoprolol succinate 25 mg once daily Continue Farxiga 10 mg once daily. If blood pressures and renal function remain stable postcardiac catheterization, will consider titrating up guideline directed medical therapy.         Relevant Medications   atorvastatin (LIPITOR) 20 MG tablet   metoprolol succinate (TOPROL-XL) 25 MG 24 hr tablet   Moderate mitral regurgitation, eccentric appearing on transthoracic echo 03/20/2023 at [facility]   Follow-up with annual echocardiogram.  Tentatively next September.      Relevant Medications   atorvastatin (LIPITOR) 20 MG tablet   metoprolol succinate (TOPROL-XL) 25 MG 24 hr tablet   CKD (chronic kidney disease), stage III (HCC)   Aware of contrast-induced nephropathy risk. Willing to proceed. Renal protection with saline infusion preprocedure as per protocol from the Cath Lab.  Labs at discharge on 03/23/2023 noted sodium 139, potassium 3.7, BUN 19, creatinine 1.6, EGFR 43 Hemoglobin 11.4, hematocrit 33.8, platelets 324, WBC 9.1      Return to clinic tentatively in 4 weeks.   History of Present Illness:    Luke Edwards is a 82 y.o. male who is being seen today for further evaluation and follow-up of CHF, cardiomyopathy recently diagnosed at [facility] during inpatient stay. PCP [name], [name], MD.  Here for the visit today accompanied by his wife and daughter.  Pleasant gentleman has a history of CHF with reduced LVEF 35 to 40% new diagnosis by echocardiogram December 2024, diabetes mellitus, hypertension, CKD stage II/III, moderate eccentric mitral regurgitation, BPH, history of CVA, left hip surgery  3 months ago.  Recently was admitted and discharged from [facility] between 03/19/2023 through 03/23/2023. Was reportedly having increased bilateral lower extremity and worsening shortness of breath over the past few months, poor response to Lasix initiated by PCP  presented with hypoxia to the ER.  Workup with chest x-ray showed bilateral pleural effusion, D-dimer was elevated, CTA chest ruled out PE.  Underwent ultrasound-guided thoracentesis left side on 03/20/2023 and right side on 03/21/2023, echocardiogram noted moderate LV dysfunction with diffuse hypokinesis EF 35 to 40%, moderate eccentric MR.  proBNP was elevated 1800.  Troponins were flat at 0.06, 0.06, 0.07.  Symptom improved with diuresis and thoracentesis. Attempts to optimize guideline directed medical therapy [was given carvedilol, Farxiga, spironolactone, Entresto] and continued diuresis were limited due to soft blood pressures and mildly uptrending creatinine. He was continued on for CR 10 mg once daily and started on low-dose metoprolol succinate 25 mg once daily at discharge and resume low-dose furosemide 20 mg once a day.  Labs at discharge on 03/23/2023 noted sodium 139, potassium 3.7, BUN 19, creatinine 1.6, EGFR 43 Hemoglobin 11.4, hematocrit 33.8, platelets 324, WBC 9.1.  While at home. No new symptoms. At discharge he was sent home on Farxiga 5 mg once a day.  He is requesting prescription for the 10 mg once a day.  No worsening pedal edema. No orthopnea, paroxysmal nocturnal dyspnea.  EKG in the clinic today shows sinus rhythm heart rate 88/min,No significant ST-T changes.  Transfer text. In comparison prior EKG from 03/19/2023 at [facility] ER noted sinus tachycardia with anteroseptal Q waves.  Past Medical History:  Diagnosis Date   COVID-19    Diabetes mellitus without complication (HCC)    GERD (gastroesophageal reflux disease)    HLD (hyperlipidemia)    Hypertension     Past Surgical History:  Procedure Laterality Date   APPENDECTOMY     CHOLECYSTECTOMY     TOTAL HIP ARTHROPLASTY Left 12/23/2022   Procedure: TOTAL HIP ARTHROPLASTY ANTERIOR APPROACH;  Surgeon: Luke Frederic, MD;  Location: [facility] OR;  Service: Orthopedics;  Laterality: Left;    Current  Medications: Current Meds  Medication Sig   aspirin 81 MG chewable tablet Chew 1 tablet (81 mg total) by mouth 2 (two) times daily. Take twice daily for 4 weeks and then once daily as before   atorvastatin (LIPITOR) 20 MG tablet Take 20 mg by mouth daily.   bethanechol (URECHOLINE) 25 MG tablet Take 25 mg by mouth daily.   Cyanocobalamin (VITAMIN B-12) 2500 MCG SUBL Place 2,500 mcg under the tongue daily.   feeding supplement, GLUCERNA SHAKE, (GLUCERNA SHAKE) LIQD Take 237 mLs by mouth 3 (three) times daily between meals.   finasteride (PROSCAR) 5 MG tablet Take 5 mg by mouth at bedtime.    fluticasone (FLONASE) 50 MCG/ACT nasal spray Place 1 spray into both nostrils daily.   furosemide (LASIX) 20 MG tablet Take 20 mg by mouth daily as needed for edema.   gabapentin (NEURONTIN) 600 MG tablet Take 600 mg by mouth 2 (two) times daily.   insulin glargine (LANTUS) 100 UNIT/ML injection Inject 0.2 mLs (20 Units total) into the skin 2 (two) times daily for 4 days, THEN 0.18 mLs (18 Units total) 2 (two) times daily for 3 days, THEN 0.15 mLs (15 Units total) 2 (two) times daily for 3 days, THEN 0.12 mLs (12 Units total) 2 (two) times daily for 3 days, THEN 0.08 mLs (8 Units total) 2 (two) times daily for 3  days, THEN 0.05 mLs (5 Units total) 2 (two) times daily for 3 days. (Patient taking differently: Inject 65 units into the skin at night)   insulin regular (NOVOLIN R) 100 units/mL injection Inject 6 Units into the skin 3 (three) times daily before meals. Inject 2-10 units per sliding scale three times daily before meals: 150-200=2u, 201-250=4u, 251-300=6u, 301-350=8u, 351-400=10   loratadine (CLARITIN) 10 MG tablet Take 10 mg by mouth daily.   metoprolol succinate (TOPROL-XL) 25 MG 24 hr tablet Take 25 mg by mouth daily.   Multiple Vitamin (MULTIVITAMIN WITH MINERALS) TABS tablet Take 1 tablet by mouth daily.   tamsulosin (FLOMAX) 0.4 MG CAPS capsule Take 0.4 mg by mouth daily after supper.     [DISCONTINUED] famotidine (PEPCID) 20 MG tablet Take 20 mg by mouth daily.   [DISCONTINUED] glipiZIDE (GLUCOTROL XL) 10 MG 24 hr tablet Take 10 mg by mouth daily.    [DISCONTINUED] HYDROcodone-acetaminophen (NORCO/VICODIN) 5-325 MG tablet Take 1-2 tablets by mouth every 6 (six) hours as needed for severe pain.   [DISCONTINUED] lisinopril-hydrochlorothiazide (PRINZIDE,ZESTORETIC) 20-25 MG per tablet Take 1 tablet by mouth daily.   [DISCONTINUED] methocarbamol (ROBAXIN) 500 MG tablet Take 1 tablet (500 mg total) by mouth every 6 (six) hours as needed for muscle spasms.   [DISCONTINUED] polyethylene glycol (MIRALAX / GLYCOLAX) 17 g packet Take 17 g by mouth daily as needed for mild constipation.   [DISCONTINUED] senna (SENOKOT) 8.6 MG TABS tablet Take 1 tablet (8.6 mg total) by mouth 2 (two) times daily.   [DISCONTINUED] simvastatin (ZOCOR) 40 MG tablet Take 40 mg by mouth daily.   [DISCONTINUED] tadalafil (CIALIS) 5 MG tablet Take 5 mg by mouth daily.     Allergies:   Levofloxacin and Prednisone   Social History   Socioeconomic History   Marital status: Married    Spouse name: Velna Hatchet   Number of children: 2   Years of education: 11   Highest education level: Not on file  Occupational History   Not on file  Tobacco Use   Smoking status: Never   Smokeless tobacco: Never  Vaping Use   Vaping status: Never Used  Substance and Sexual Activity   Alcohol use: Yes    Alcohol/week: 2.0 standard drinks of alcohol    Types: 2 Cans of beer per week    Comment: Once during the week    Drug use: No   Sexual activity: Not on file  Other Topics Concern   Not on file  Social History Narrative   Right handed   Lives in one story and lives with wife and daughter   Social Drivers of Health   Financial Resource Strain: Not on file  Food Insecurity: Low Risk  (02/03/2023)   Received from [facility]   Hunger Vital Sign    Worried About Running Out of Food in the Last Year: Never true    Ran  Out of Food in the Last Year: Never true  Transportation Needs: No Transportation Needs (02/03/2023)   Received from [facility]    In the past 12 months, has lack of reliable transportation kept you from medical appointments, meetings, work or from getting things needed for daily living? : No  Physical Activity: Not on file  Stress: Not on file  Social Connections: Not on file     Family History: The patient's family history includes Diabetes in his daughter and mother; Heart attack in his father; Valvular heart disease in his mother. ROS:  Please see the history of present illness.    All 14 point review of systems negative except as described per history of present illness.  EKGs/Labs/Other Studies Reviewed:    The following studies were reviewed today:   EKG:  EKG Interpretation Date/Time:  Friday March 24 2023 14:34:47 EST Ventricular Rate:  88 PR Interval:  156 QRS Duration:  94 QT Interval:  398 QTC Calculation: 481 R Axis:   -22  Text Interpretation: Normal sinus rhythm When compared with ECG of 21-Dec-2022 05:44, Questionable change in initial forces of Septal leads Confirmed by Huntley Dec reddy [phone]) on 03/24/2023 1:44:52 PM    Recent Labs: 12/21/2022: ALT 17 12/24/2022: Magnesium 1.8 12/26/2022: BUN 16; Creatinine, Ser 1.26; Hemoglobin 9.4; Platelets 168; Potassium 4.0; Sodium 134  Recent Lipid Panel    Component Value Date/Time   TRIG 93 04/29/2019 1439    Physical Exam:    VS:  BP 98/68 (BP Location: Left Arm, Patient Position: Sitting)   Pulse 88   Ht 6\' 2"  (1.88 m)   Wt 202 lb 9.6 oz (91.9 kg)   SpO2 98%   BMI 26.01 kg/m     Wt Readings from Last 3 Encounters:  03/24/23 202 lb 9.6 oz (91.9 kg)  12/23/22 215 lb 2.7 oz (97.6 kg)  11/27/19 206 lb (93.4 kg)     GENERAL:  Well nourished, well developed in no acute distress NECK: No JVD; No carotid bruits CARDIAC: RRR, S1 and S2 present, 4 x 6 holosystolic murmur best  heard parasternally left side.   CHEST:  Clear to auscultation without rales, wheezing or rhonchi  Extremities: No pitting pedal edema. Pulses bilaterally symmetric with radial 2+ and dorsalis pedis 2+ NEUROLOGIC:  Alert and oriented x 3  Medication Adjustments/Labs and Tests Ordered: Current medicines are reviewed at length with the patient today.  Concerns regarding medicines are outlined above.  Orders Placed This Encounter  Procedures   EKG 12-Lead   No orders of the defined types were placed in this encounter.   Signed, Cecille Amsterdam, MD, MPH, [facility]. 03/24/2023 2:15 PM     [facility]

## 2023-03-25 LAB — COMPREHENSIVE METABOLIC PANEL
ALT: 9 [IU]/L (ref 0–44)
AST: 15 [IU]/L (ref 0–40)
Albumin: 3.6 g/dL — ABNORMAL LOW (ref 3.7–4.7)
Alkaline Phosphatase: 90 [IU]/L (ref 44–121)
BUN/Creatinine Ratio: 13 (ref 10–24)
BUN: 19 mg/dL (ref 8–27)
Bilirubin Total: 0.3 mg/dL (ref 0.0–1.2)
CO2: 23 mmol/L (ref 20–29)
Calcium: 8.4 mg/dL — ABNORMAL LOW (ref 8.6–10.2)
Chloride: 100 mmol/L (ref 96–106)
Creatinine, Ser: 1.51 mg/dL — ABNORMAL HIGH (ref 0.76–1.27)
Globulin, Total: 2.9 g/dL (ref 1.5–4.5)
Glucose: 225 mg/dL — ABNORMAL HIGH (ref 70–99)
Potassium: 4.7 mmol/L (ref 3.5–5.2)
Sodium: 140 mmol/L (ref 134–144)
Total Protein: 6.5 g/dL (ref 6.0–8.5)
eGFR: 46 mL/min/{1.73_m2} — ABNORMAL LOW (ref >59)

## 2023-03-25 LAB — CBC
Hematocrit: 37.9 % (ref 37.5–51.0)
Hemoglobin: 11.7 g/dL — ABNORMAL LOW (ref 13.0–17.7)
MCH: 27.9 pg (ref 26.6–33.0)
MCHC: 30.9 g/dL — ABNORMAL LOW (ref 31.5–35.7)
MCV: 91 fL (ref 79–97)
Platelets: 382 10*3/uL (ref 150–450)
RBC: 4.19 x10E6/uL (ref 4.14–5.80)
RDW: 13 % (ref 11.6–15.4)
WBC: 8.2 10*3/uL (ref 3.4–10.8)

## 2023-03-25 LAB — LIPID PANEL
Chol/HDL Ratio: 3.9 {ratio} (ref 0.0–5.0)
Cholesterol, Total: 124 mg/dL (ref 100–199)
HDL: 32 mg/dL — ABNORMAL LOW (ref >39)
LDL Chol Calc (NIH): 72 mg/dL (ref 0–99)
Triglycerides: 107 mg/dL (ref 0–149)
VLDL Cholesterol Cal: 20 mg/dL (ref 5–40)

## 2023-03-27 ENCOUNTER — Telehealth: Payer: Self-pay | Admitting: Cardiology

## 2023-03-27 NOTE — Telephone Encounter (Signed)
Pt's daughter is calling wanting to know if pt's cath can be moved up. She said he is feeling so bad. Please advise

## 2023-03-27 NOTE — Telephone Encounter (Signed)
Patient identification verified by 2 forms. Marilynn Rail, RN    Called and spoke to patients daughter Luke Edwards states:   -patient is feeling really bad   -patient had chest pressure last night and this morning   -chest pressure has resolved   -patient feels very fatigue, weak and having trouble breathing   -he forgot to take morning meds prior to PCP appointment today   -Got samples of Farxiga today, plans to restart today   -Last med took was 20mg  of lasix last night   -is concerned about Sx, would like patient to have cath today  Advised Darla to present to ED today with patient for concerns of worsening symptoms  Darla verbalized understanding, no further questions at this time

## 2023-03-27 NOTE — Telephone Encounter (Signed)
Called the patient's daughter and she stated that he was having chest pressure across the top of his chest. And that he had an appointment with his PCP this morning and they recommended that he go to the ER. Darla, the patient's daughter stated that he was having to hold his own head up because he was so fatigued. Darla stated that his cardiac cath is tomorrow and wanted to see about moving it up to today. Based on his symptoms I recommended that the patient go to the ER to be evaluated and they may be able to take him to the cath lab from the ER. Darla stated that the patient refused to go to the ER. She stated that she would ask him to go to the ER, but she could not make him go. I explained that I would send this message to Dr. Vincent Gros and Dr. [place] for further guidance.

## 2023-03-27 NOTE — Telephone Encounter (Signed)
Called Darla and informed her of Dr. Madireddy's recommendation below:  "I agree with further evaluation in the ER. Expediting the cath is not the answer at this time. Symptoms could be related to low blood pressures [this he had while at [facility], any other arrhythmias or electrolyte abnormalities.  I would recommend going to the ER, if possible at [facility] to be evaluated for hemodynamics, electrolyte abnormalities. If further needed for inpatient care he can be admitted there and potentially keep the appointment for cath if everything looks stable."  Darla was appreciative for the call and will let the patient know Dr. Madireddy's recommendation.

## 2023-03-28 ENCOUNTER — Encounter (HOSPITAL_COMMUNITY)
Admission: RE | Disposition: A | Discharge status: Home / Self Care | Payer: Self-pay | Source: Home / Self Care | Attending: Cardiology

## 2023-03-28 ENCOUNTER — Other Ambulatory Visit: Payer: Self-pay

## 2023-03-28 ENCOUNTER — Ambulatory Visit (HOSPITAL_COMMUNITY)
Admission: RE | Admit: 2023-03-28 | Discharge: 2023-03-28 | Disposition: A | Discharge status: Home / Self Care | Payer: Medicare HMO | Attending: Cardiology | Admitting: Cardiology

## 2023-03-28 DIAGNOSIS — I5021 Acute systolic (congestive) heart failure: Secondary | ICD-10-CM | POA: Diagnosis present

## 2023-03-28 DIAGNOSIS — I272 Pulmonary hypertension, unspecified: Secondary | ICD-10-CM | POA: Insufficient documentation

## 2023-03-28 DIAGNOSIS — N183 Chronic kidney disease, stage 3 unspecified: Secondary | ICD-10-CM | POA: Insufficient documentation

## 2023-03-28 DIAGNOSIS — Z833 Family history of diabetes mellitus: Secondary | ICD-10-CM | POA: Insufficient documentation

## 2023-03-28 DIAGNOSIS — Z8249 Family history of ischemic heart disease and other diseases of the circulatory system: Secondary | ICD-10-CM | POA: Diagnosis not present

## 2023-03-28 DIAGNOSIS — Z794 Long term (current) use of insulin: Secondary | ICD-10-CM | POA: Diagnosis not present

## 2023-03-28 DIAGNOSIS — I2510 Atherosclerotic heart disease of native coronary artery without angina pectoris: Secondary | ICD-10-CM | POA: Diagnosis not present

## 2023-03-28 DIAGNOSIS — N1832 Chronic kidney disease, stage 3b: Secondary | ICD-10-CM

## 2023-03-28 DIAGNOSIS — I5022 Chronic systolic (congestive) heart failure: Secondary | ICD-10-CM

## 2023-03-28 DIAGNOSIS — E1122 Type 2 diabetes mellitus with diabetic chronic kidney disease: Secondary | ICD-10-CM | POA: Diagnosis not present

## 2023-03-28 DIAGNOSIS — I130 Hypertensive heart and chronic kidney disease with heart failure and stage 1 through stage 4 chronic kidney disease, or unspecified chronic kidney disease: Secondary | ICD-10-CM | POA: Insufficient documentation

## 2023-03-28 HISTORY — PX: RIGHT/LEFT HEART CATH AND CORONARY ANGIOGRAPHY: CATH118266

## 2023-03-28 LAB — POCT I-STAT EG7
Acid-Base Excess: 0 mmol/L (ref 0.0–2.0)
Acid-base deficit: 1 mmol/L (ref 0.0–2.0)
Bicarbonate: 24 mmol/L (ref 20.0–28.0)
Bicarbonate: 24.7 mmol/L (ref 20.0–28.0)
Calcium, Ion: 1.16 mmol/L (ref 1.15–1.40)
Calcium, Ion: 1.18 mmol/L (ref 1.15–1.40)
HCT: 33 % — ABNORMAL LOW (ref 39.0–52.0)
HCT: 34 % — ABNORMAL LOW (ref 39.0–52.0)
Hemoglobin: 11.2 g/dL — ABNORMAL LOW (ref 13.0–17.0)
Hemoglobin: 11.6 g/dL — ABNORMAL LOW (ref 13.0–17.0)
O2 Saturation: 59 %
O2 Saturation: 61 %
Potassium: 3.8 mmol/L (ref 3.5–5.1)
Potassium: 3.9 mmol/L (ref 3.5–5.1)
Sodium: 139 mmol/L (ref 135–145)
Sodium: 140 mmol/L (ref 135–145)
TCO2: 25 mmol/L (ref 22–32)
TCO2: 26 mmol/L (ref 22–32)
pCO2, Ven: 39.1 mm[Hg] — ABNORMAL LOW (ref 44–60)
pCO2, Ven: 39.4 mm[Hg] — ABNORMAL LOW (ref 44–60)
pH, Ven: 7.396 (ref 7.25–7.43)
pH, Ven: 7.405 (ref 7.25–7.43)
pO2, Ven: 31 mm[Hg] — CL (ref 32–45)
pO2, Ven: 31 mm[Hg] — CL (ref 32–45)

## 2023-03-28 LAB — POCT I-STAT 7, (LYTES, BLD GAS, ICA,H+H)
Acid-base deficit: 2 mmol/L (ref 0.0–2.0)
Bicarbonate: 21.9 mmol/L (ref 20.0–28.0)
Calcium, Ion: 1.13 mmol/L — ABNORMAL LOW (ref 1.15–1.40)
HCT: 33 % — ABNORMAL LOW (ref 39.0–52.0)
Hemoglobin: 11.2 g/dL — ABNORMAL LOW (ref 13.0–17.0)
O2 Saturation: 95 %
Patient temperature: 11
Potassium: 3.8 mmol/L (ref 3.5–5.1)
Sodium: 140 mmol/L (ref 135–145)
TCO2: 23 mmol/L (ref 22–32)
pCO2 arterial: <15 mm[Hg] — CL (ref 32–48)
pH, Arterial: 7.802 (ref 7.35–7.45)
pO2, Arterial: <15 mm[Hg] — CL (ref 83–108)

## 2023-03-28 LAB — GLUCOSE, CAPILLARY
Glucose-Capillary: 241 mg/dL — ABNORMAL HIGH (ref 70–99)
Glucose-Capillary: 192 mg/dL — ABNORMAL HIGH (ref 70–99)

## 2023-03-28 SURGERY — RIGHT/LEFT HEART CATH AND CORONARY ANGIOGRAPHY
Anesthesia: LOCAL

## 2023-03-28 MED ORDER — IOHEXOL 350 MG/ML SOLN
INTRAVENOUS | Status: DC | PRN
  Administered 2023-03-28: 45 mL

## 2023-03-28 MED ORDER — ONDANSETRON HCL 4 MG/2ML IJ SOLN
4.0000 mg | Freq: Four times a day (QID) | INTRAMUSCULAR | Status: DC | PRN
Start: 2023-03-28 — End: 2023-03-28

## 2023-03-28 MED ORDER — MIDAZOLAM HCL 2 MG/2ML IJ SOLN
INTRAMUSCULAR | Status: DC | PRN
  Administered 2023-03-28: 1 mg via INTRAVENOUS

## 2023-03-28 MED ORDER — VERAPAMIL HCL 2.5 MG/ML IV SOLN
INTRAVENOUS | Status: DC | PRN
  Administered 2023-03-28: 10 mL via INTRA_ARTERIAL

## 2023-03-28 MED ORDER — LIDOCAINE HCL (PF) 1 % IJ SOLN
INTRAMUSCULAR | Status: DC | PRN
  Administered 2023-03-28 (×2): 2 mL

## 2023-03-28 MED ORDER — VERAPAMIL HCL 2.5 MG/ML IV SOLN
INTRAVENOUS | Status: AC
  Filled 2023-03-28: qty 2

## 2023-03-28 MED ORDER — FENTANYL CITRATE (PF) 100 MCG/2ML IJ SOLN
INTRAMUSCULAR | Status: DC | PRN
  Administered 2023-03-28: 25 ug via INTRAVENOUS

## 2023-03-28 MED ORDER — SODIUM CHLORIDE 0.9% FLUSH: 3.0000 mL | INTRAVENOUS | Status: DC | PRN

## 2023-03-28 MED ORDER — HEPARIN SODIUM (PORCINE) 1000 UNIT/ML IJ SOLN
INTRAMUSCULAR | Status: AC
  Filled 2023-03-28: qty 10

## 2023-03-28 MED ORDER — ACETAMINOPHEN 325 MG PO TABS: 650.0000 mg | ORAL_TABLET | ORAL | Status: DC | PRN

## 2023-03-28 MED ORDER — SODIUM CHLORIDE 0.9 % IV SOLN: 250.0000 mL | INTRAVENOUS | Status: DC | PRN

## 2023-03-28 MED ORDER — HEPARIN SODIUM (PORCINE) 1000 UNIT/ML IJ SOLN
INTRAMUSCULAR | Status: DC | PRN
  Administered 2023-03-28: 4500 [IU] via INTRAVENOUS

## 2023-03-28 MED ORDER — LIDOCAINE HCL (PF) 1 % IJ SOLN
INTRAMUSCULAR | Status: AC
  Filled 2023-03-28: qty 30

## 2023-03-28 MED ORDER — HEPARIN (PORCINE) IN NACL 1000-0.9 UT/500ML-% IV SOLN
INTRAVENOUS | Status: DC | PRN
  Administered 2023-03-28 (×2): 500 mL

## 2023-03-28 MED ORDER — FENTANYL CITRATE (PF) 100 MCG/2ML IJ SOLN
INTRAMUSCULAR | Status: AC
  Filled 2023-03-28: qty 2

## 2023-03-28 MED ORDER — MIDAZOLAM HCL 2 MG/2ML IJ SOLN
INTRAMUSCULAR | Status: AC
  Filled 2023-03-28: qty 2

## 2023-03-28 MED ORDER — SODIUM CHLORIDE 0.9 % IV SOLN
INTRAVENOUS | Status: DC
Start: 2023-03-28 — End: 2023-03-28

## 2023-03-28 MED ORDER — SODIUM CHLORIDE 0.9% FLUSH: 3.0000 mL | Freq: Two times a day (BID) | INTRAVENOUS | Status: DC

## 2023-03-28 SURGICAL SUPPLY — 10 items
CATH 5FR JL3.5 JR4 ANG PIG MP (CATHETERS) IMPLANT
CATH BALLN WEDGE 5F 110CM (CATHETERS) IMPLANT
DEVICE RAD COMP TR BAND LRG (VASCULAR PRODUCTS) IMPLANT
GLIDESHEATH SLEND SS 6F .021 (SHEATH) IMPLANT
GUIDEWIRE INQWIRE 1.5J.035X260 (WIRE) IMPLANT
INQWIRE 1.5J .035X260CM (WIRE) ×1
PACK CARDIAC CATHETERIZATION (CUSTOM PROCEDURE TRAY) ×1 IMPLANT
SET ATX-X65L (MISCELLANEOUS) IMPLANT
SHEATH GLIDE SLENDER 4/5FR (SHEATH) IMPLANT
SHEATH PROBE COVER 6X72 (BAG) IMPLANT

## 2023-03-28 NOTE — Interval H&P Note (Signed)
History and Physical Interval Note:  03/28/2023 1:11 PM  Luke Edwards  has presented today for surgery, with the diagnosis of caridomyopathy - abnormal ct.  The various methods of treatment have been discussed with the patient and family. After consideration of risks, benefits and other options for treatment, the patient has consented to  Procedure(s): RIGHT/LEFT HEART CATH AND CORONARY ANGIOGRAPHY (N/A) as a surgical intervention.  The patient's history has been reviewed, patient examined, no change in status, stable for surgery.  I have reviewed the patient's chart and labs.  Questions were answered to the patient's satisfaction.   Cath Lab Visit (complete for each Cath Lab visit)  Clinical Evaluation Leading to the Procedure:   ACS: Yes.    Non-ACS:    Anginal Classification: CCS III  Anti-ischemic medical therapy: Minimal Therapy (1 class of medications)  Non-Invasive Test Results: No non-invasive testing performed  Prior CABG: No previous CABG        Theron Arista [facility] 03/28/2023  1:11 PM

## 2023-03-29 ENCOUNTER — Telehealth: Payer: Self-pay

## 2023-03-29 ENCOUNTER — Encounter (HOSPITAL_COMMUNITY): Payer: Self-pay | Admitting: Cardiology

## 2023-03-29 ENCOUNTER — Other Ambulatory Visit: Payer: Self-pay

## 2023-03-29 MED ORDER — METOPROLOL SUCCINATE ER 25 MG PO TB24: 25.0000 mg | ORAL_TABLET | Freq: Every day | ORAL | 3 refills | Status: AC

## 2023-03-29 NOTE — Telephone Encounter (Signed)
Called the patient's daughter and she was questioning whether she should follow the changes that Dr. [place] was making with the patient's medications, etc... I explained that after the cardiac cath the patient just had on 03/28/23 he was making changes based on what he found when he did the patient's cardiac cath and she should follow those orders. Patient's daughter verbalized understanding and had no further questions at this time.

## 2023-03-29 NOTE — Telephone Encounter (Signed)
Pt daughter called in asking to speak with nurse about pt's cardioversion and some medication changes.

## 2023-03-31 ENCOUNTER — Telehealth: Payer: Self-pay

## 2023-03-31 NOTE — Telephone Encounter (Signed)
Patient 5lbs pains his base line has increase from 92 to 94. Right side is very swollen. Please advise

## 2023-03-31 NOTE — Telephone Encounter (Signed)
Spoke with pt and spouse. They stated that the pt had taken an extra Lasix today and was seeing an improvement in the fluid. He stated that he was feeling ok and knows if the symptoms worsen and the Lasix is not helping he will go to the ED. Encouraged pt to call Monday with an update on his condition. He verbalized understanding and had no further questions.

## 2023-04-03 ENCOUNTER — Telehealth: Payer: Self-pay

## 2023-04-03 NOTE — Telephone Encounter (Signed)
Spoke with daughter this morning. She stated that the nurse saw him Friday and heard fluid in his lungs. The daughter is wanting [place] to make arrangements for him to go to the hospital to get an outpatient procedure to get the fluid removed. She stated that his Pulse Ox is 90 and drops in the 80's when he is up walking around. Will send to Dr. Vincent Gros for further advice.

## 2023-04-03 NOTE — Telephone Encounter (Signed)
Daughter (Darla) called again to follow-up on next steps regarding patient's fluid on lungs.

## 2023-04-03 NOTE — Telephone Encounter (Signed)
Spoke with daughter who was not happy about having to take pt to the ER. She felt this could be taken care of with an out patient xray and outpatient procedure. Explained that he would need to be evaluated in the ED to make sure to diagnose the correct issue before taking care of it , as per Dr. Vincent Gros it could be pleural effusion or CHF and need different treatments or something entirely different. Daughter verbalized understanding but did not confirm their plan.

## 2023-04-27 DIAGNOSIS — I10 Essential (primary) hypertension: Secondary | ICD-10-CM | POA: Insufficient documentation

## 2023-04-27 DIAGNOSIS — E119 Type 2 diabetes mellitus without complications: Secondary | ICD-10-CM | POA: Insufficient documentation

## 2023-04-27 DIAGNOSIS — K219 Gastro-esophageal reflux disease without esophagitis: Secondary | ICD-10-CM | POA: Insufficient documentation

## 2023-05-01 ENCOUNTER — Ambulatory Visit: Payer: HMO | Admitting: Cardiology

## 2023-05-01 VITALS — BP 130/78 | HR 94 | Ht 74.0 in | Wt 202.2 lb

## 2023-05-01 DIAGNOSIS — N1832 Chronic kidney disease, stage 3b: Secondary | ICD-10-CM

## 2023-05-01 DIAGNOSIS — I5022 Chronic systolic (congestive) heart failure: Secondary | ICD-10-CM | POA: Diagnosis not present

## 2023-05-01 DIAGNOSIS — I10 Essential (primary) hypertension: Secondary | ICD-10-CM

## 2023-05-01 DIAGNOSIS — I340 Nonrheumatic mitral (valve) insufficiency: Secondary | ICD-10-CM | POA: Diagnosis not present

## 2023-05-01 MED ORDER — METOPROLOL SUCCINATE ER 50 MG PO TB24: 50.0000 mg | ORAL_TABLET | Freq: Every day | ORAL | 3 refills | Status: AC

## 2023-05-01 MED ORDER — DAPAGLIFLOZIN PROPANEDIOL 10 MG PO TABS: 10.0000 mg | ORAL_TABLET | Freq: Every day | ORAL | Status: AC

## 2023-05-01 MED ORDER — EMPAGLIFLOZIN 10 MG PO TABS: 10.0000 mg | ORAL_TABLET | Freq: Every day | ORAL | 1 refills | Status: AC

## 2023-05-01 NOTE — Patient Instructions (Signed)
Medication Instructions:  Your physician recommends that you continue on your current medications as directed.   Start Farxiga 10 mg once a day for 4 weeks.   Once you are finished taking Farxiga, start taking Jardiance 10 mg once a day.   Increase your Metoprolol to 50 mg once a day.  *If you need a refill on your cardiac medications before your next appointment, please call your pharmacy*   Lab Work: Your physician recommends that you have labs done in the office today. Your test included  complete metabolic panel, and PRO Bnp  If you have labs (blood work) drawn today and your tests are completely normal, you will receive your results only by: MyChart Message (if you have MyChart) OR A paper copy in the mail If you have any lab test that is abnormal or we need to change your treatment, we will call you to review the results.   Testing/Procedures: None Ordered   Follow-Up: At [facility], you and your health needs are our priority.  As part of our continuing mission to provide you with exceptional heart care, we have created designated Provider Care Teams.  These Care Teams include your primary Cardiologist (physician) and Advanced Practice Providers (APPs -  Physician Assistants and Nurse Practitioners) who all work together to provide you with the care you need, when you need it.  We recommend signing up for the patient portal called "MyChart".  Sign up information is provided on this After Visit Summary.  MyChart is used to connect with patients for Virtual Visits (Telemedicine).  Patients are able to view lab/test results, encounter notes, upcoming appointments, etc.  Non-urgent messages can be sent to your provider as well.   To learn more about what you can do with MyChart, go to [email or web address].    Your next appointment:   3 month follow up

## 2023-05-01 NOTE — Progress Notes (Addendum)
 Cardiology Office Note:  .   Date:  05/01/2023  ID:  Luke Edwards, DOB December 12, 1940, MRN [id number] PCP: Galvin Proffer, MD  [facility] Providers Cardiologist:  Marlyn Corporal Madireddy, MD    History of Present Illness: .   Luke Edwards is a 83 y.o. male with a past medical history of hypertension, HFrEF, CKD stage III, DM 2, moderate MR, history of CVA, BPH.  03/28/2023 cardiac cath nonobstructive CAD, moderate pulmonary hypertension 03/20/2023 Echo revealed an EF of 35 to 40%, diffuse hypokinesis, mild MR 03/23/2017 echo EF 55 to 60% increase left wall thickness, grade 1 DD, mild aortic regurgitation  He established care with Dr. Vincent Gros on 03/24/2023 after recent hospitalization for heart failure--he had had worsening shortness of breath and increased pedal edema, D-dimer was elevated, workup revealed bilateral pleural effusions and he ultimately underwent thoracentesis bilaterally, his EF was noted to be 35 to 40%, proBNP 1800, troponins flat.  Options were discussed for further review of his cardiomyopathy, decision was made to proceed with a left heart cath.   He presents today accompanied by his daughter for follow-up after his recent hospitalization.  He is feeling okay, he does still have some DOE.    ROS: Review of Systems  Constitutional:  Positive for malaise/fatigue.  Respiratory:  Positive for shortness of breath.   Cardiovascular:  Positive for leg swelling (pedal edema).  All other systems reviewed and are negative.    Studies Reviewed: .        Cardiac Studies & Procedures   CARDIAC CATHETERIZATION  CARDIAC CATHETERIZATION 03/28/2023  Narrative   Mid LAD lesion is 40% stenosed.   LPAV lesion is 30% stenosed with 30% stenosed side branch in LPDA.   1st Mrg lesion is 20% stenosed.   LV end diastolic pressure is mildly elevated.   Hemodynamic findings consistent with moderate pulmonary hypertension.  Nonobstructive CAD Mildly elevated LVEDP 23 mm  Hg Moderate pulmonary HTN. PAP 53/23 mean 36 mm Hg Cardiac output 5.24 L/min, index 2.39.  Plan: optimize medical therapy for CHF  Findings Coronary Findings Diagnostic  Dominance: Left  Left Anterior Descending Mid LAD lesion is 40% stenosed.  Left Circumflex  First Obtuse Marginal Branch 1st Mrg lesion is 20% stenosed.  Left Posterior Atrioventricular Artery LPAV lesion is 30% stenosed with 30% stenosed side branch in LPDA.  Intervention  No interventions have been documented.   STRESS TESTS  EXERCISE TOLERANCE TEST (ETT) 03/23/2017  Narrative  Blood pressure demonstrated a normal response to exercise.  There was no ST segment deviation noted during stress.  Clinically and electrically negative for ischemia.  ECHOCARDIOGRAM  ECHOCARDIOGRAM COMPLETE 03/23/2017  Narrative *[facility]* [address] [place], [place] [address] [phone]  ------------------------------------------------------------------- Transthoracic Echocardiography  Patient:    Luke, Edwards MR #:       [id number] Study Date: 03/23/2017 Gender:     M Age:        50 Height:     188 cm Weight:     93.4 kg BSA:        2.22 m^2 Pt. Status: Room:  ATTENDING    Kristeen Miss, M.D. SONOGRAPHER  Dewitt Hoes, RDCS PERFORMING   [facility], Outpatient ORDERING     Filbert Schilder REFERRING    Georgie Chard D  cc:  ------------------------------------------------------------------- LV EF: 55% -   60%  ------------------------------------------------------------------- Indications:      Chest pain (R07.9).  ------------------------------------------------------------------- History:   PMH:   Coronary artery  disease.  Risk factors: Hypertension. Diabetes mellitus.  ------------------------------------------------------------------- Study Conclusions  - Left ventricle: The cavity size was normal. Wall thickness was increased in a pattern of mild LVH. There was focal  basal hypertrophy. Systolic function was normal. The estimated ejection fraction was in the range of 55% to 60%. Wall motion was normal; there were no regional wall motion abnormalities. Doppler parameters are consistent with abnormal left ventricular relaxation (grade 1 diastolic dysfunction). - Aortic valve: There was mild regurgitation. - Pulmonary arteries: PA peak pressure: 35 mm Hg (S).  ------------------------------------------------------------------- Study data:  No prior study was available for comparison.  Study status:  Routine.  Procedure:  The patient reported no pain pre or post test. Transthoracic echocardiography. Image quality was adequate.  Study completion:  There were no complications. Transthoracic echocardiography.  M-mode, complete 2D, spectral Doppler, and color Doppler.  Birthdate:  Patient birthdate: 06-14-1940.  Age:  Patient is 83 yr old.  Sex:  Gender: male. BMI: 26.4 kg/m^2.  Blood pressure:     134/91  Patient status: Outpatient.  Study date:  Study date: 03/23/2017. Study time: 11:03 AM.  Location:  [facility]  -------------------------------------------------------------------  ------------------------------------------------------------------- Left ventricle:  The cavity size was normal. Wall thickness was increased in a pattern of mild LVH. There was focal basal hypertrophy. Systolic function was normal. The estimated ejection fraction was in the range of 55% to 60%. Wall motion was normal; there were no regional wall motion abnormalities. Doppler parameters are consistent with abnormal left ventricular relaxation (grade 1 diastolic dysfunction).  ------------------------------------------------------------------- Aortic valve:   Structurally normal valve.   Cusp separation was normal.  Doppler:  Transvalvular velocity was within the normal range. There was no stenosis. There was mild regurgitation.    VTI ratio of LVOT to aortic valve:  0.62. Valve area (VTI): 2.34 cm^2. Indexed valve area (VTI): 1.05 cm^2/m^2. Peak velocity ratio of LVOT to aortic valve: 0.53. Valve area (Vmax): 2.02 cm^2. Indexed valve area (Vmax): 0.91 cm^2/m^2. Mean velocity ratio of LVOT to aortic valve: 0.54. Valve area (Vmean): 2.07 cm^2. Indexed valve area (Vmean): 0.93 cm^2/m^2.    Mean gradient (S): 8 mm Hg. Peak gradient (S): 13 mm Hg.  ------------------------------------------------------------------- Aorta:  Aortic root: The aortic root was normal in size. Ascending aorta: The ascending aorta was normal in size.  ------------------------------------------------------------------- Mitral valve:   Structurally normal valve.   Leaflet separation was normal.  Doppler:  Transvalvular velocity was within the normal range. There was no evidence for stenosis. There was trivial regurgitation.    Peak gradient (D): 2 mm Hg.  ------------------------------------------------------------------- Left atrium:  The atrium was normal in size.  ------------------------------------------------------------------- Right ventricle:  The cavity size was normal. Systolic function was normal.  ------------------------------------------------------------------- Pulmonic valve:    The valve appears to be grossly normal. Doppler:  There was no significant regurgitation.  ------------------------------------------------------------------- Tricuspid valve:   The valve appears to be grossly normal. Doppler:  There was trivial regurgitation.  ------------------------------------------------------------------- Right atrium:  The atrium was normal in size.  ------------------------------------------------------------------- Pericardium:  There was no pericardial effusion.  ------------------------------------------------------------------- Measurements  Left ventricle                            Value          Reference LV ID, ED, PLAX chordal                    47.4  mm  43 - 52 LV ID, ES, PLAX chordal                   35.6  mm       23 - 38 LV fx shortening, PLAX chordal    (L)     25    %        >=29 LV PW thickness, ED                       12    mm       --------- IVS/LV PW ratio, ED                       0.97           <=1.3 Stroke volume, 2D                         94    ml       --------- Stroke volume/bsa, 2D                     42    ml/m^2   --------- LV e&', lateral                            6.98  cm/s     --------- LV E/e&', lateral                          11.05          --------- LV e&', medial                             5.93  cm/s     --------- LV E/e&', medial                           13             --------- LV e&', average                            6.46  cm/s     --------- LV E/e&', average                          11.94          ---------  Ventricular septum                        Value          Reference IVS thickness, ED                         11.6  mm       ---------  LVOT                                      Value          Reference LVOT ID, S  22    mm       --------- LVOT area                                 3.8   cm^2     --------- LVOT peak velocity, S                     95.3  cm/s     --------- LVOT mean velocity, S                     72.3  cm/s     --------- LVOT VTI, S                               24.7  cm       ---------  Aortic valve                              Value          Reference Aortic valve peak velocity, S             179   cm/s     --------- Aortic valve mean velocity, S             133   cm/s     --------- Aortic valve VTI, S                       40.1  cm       --------- Aortic mean gradient, S                   8     mm Hg    --------- Aortic peak gradient, S                   13    mm Hg    --------- VTI ratio, LVOT/AV                        0.62           --------- Aortic valve area, VTI                    2.34  cm^2     --------- Aortic valve  area/bsa, VTI                1.05  cm^2/m^2 --------- Velocity ratio, peak, LVOT/AV             0.53           --------- Aortic valve area, peak velocity          2.02  cm^2     --------- Aortic valve area/bsa, peak               0.91  cm^2/m^2 --------- velocity Velocity ratio, mean, LVOT/AV             0.54           --------- Aortic valve area, mean velocity          2.07  cm^2     --------- Aortic valve area/bsa, mean               0.93  cm^2/m^2 --------- velocity  Aorta  Value          Reference Aortic root ID, ED                        36    mm       --------- Ascending aorta ID, A-P, S                35    mm       ---------  Left atrium                               Value          Reference LA ID, A-P, ES                            39    mm       --------- LA ID/bsa, A-P                            1.76  cm/m^2   <=2.2 LA volume, S                              57    ml       --------- LA volume/bsa, S                          25.7  ml/m^2   --------- LA volume, ES, 1-p A4C                    45.5  ml       --------- LA volume/bsa, ES, 1-p A4C                20.5  ml/m^2   --------- LA volume, ES, 1-p A2C                    63.7  ml       --------- LA volume/bsa, ES, 1-p A2C                28.7  ml/m^2   ---------  Mitral valve                              Value          Reference Mitral E-wave peak velocity               77.1  cm/s     --------- Mitral A-wave peak velocity               95.7  cm/s     --------- Mitral deceleration time          (L)     141   ms       150 - 230 Mitral peak gradient, D                   2     mm Hg    --------- Mitral E/A ratio, peak                    0.8            ---------  Pulmonary arteries  Value          Reference PA pressure, S, DP                (H)     35    mm Hg    <=30  Tricuspid valve                           Value          Reference Tricuspid regurg peak velocity             259   cm/s     --------- Tricuspid peak RV-RA gradient             27    mm Hg    ---------  Systemic veins                            Value          Reference Estimated CVP                             8     mm Hg    ---------  Right ventricle                           Value          Reference TAPSE                                     24.3  mm       --------- RV pressure, S, DP                (H)     35    mm Hg    <=30 RV s&', lateral, S                         15.6  cm/s     ---------  Legend: (L)  and  (H)  mark values outside specified reference range.  ------------------------------------------------------------------- Prepared and Electronically Authenticated by  Kristeen Miss, M.D. 2018-12-13T14:31:43             Risk Assessment/Calculations:             Physical Exam:   VS:  BP 130/78   Pulse 94   Ht 6\' 2"  (1.88 m)   Wt 202 lb 3.2 oz (91.7 kg)   SpO2 95%   BMI 25.96 kg/m    Wt Readings from Last 3 Encounters:  05/01/23 202 lb 3.2 oz (91.7 kg)  03/28/23 205 lb (93 kg)  03/24/23 202 lb 9.6 oz (91.9 kg)    GEN: Well nourished, well developed in no acute distress NECK: No JVD; No carotid bruits CARDIAC: RRR, no murmurs, rubs, gallops RESPIRATORY: Diminished right lower lobe, all other lobes clear throughout ABDOMEN: Soft, non-tender, non-distended EXTREMITIES:  No edema; No deformity   ASSESSMENT AND PLAN: .   HFrEF-left heart cath revealed mild nonobstructive disease, NYHA class II, appears euvolemic.  GDMT has been difficult to initiate secondary to hypotension and CKD.  He is taking [place] however it is very cost prohibitive--he did fill out financial assistance paperwork however did not qualify.  We will switch him to [place] it  appears that his insurance would cover this better.  Stop Marcelline Deist, start Jardiance 10 mg daily.  Continue metoprolol but we will increase his dose to 50 mg daily as his resting heart rate is 94 bpm.  Repeat CMET, proBNP.  Would  like to get him on Entresto or MRA, but will need to repeat lab work first.  Continue Lasix 20 mg as needed.  We discussed weighing daily, he is continuing to do this, as well versed on what to do with any sudden changes in his weight.  Hypertension-blood pressure today 130/78, currently on metoprolol, increasing to 50 mg daily.    DM2-on insulin, followed by his PCP.  CKDIII - careful titration of antihypertensives and diuretics.    Dispo: CMET, proBNP, increase metoprolol 50 mg daily, will provide samples of Marcelline Deist however he will not be able to afford this long-term so we will transition him to Jardiance 10 mg daily which his insurance appears to prefer.  3 months with Dr. Vincent Gros.  Signed, Flossie Dibble, NP

## 2023-05-02 ENCOUNTER — Telehealth: Payer: Self-pay

## 2023-05-02 LAB — COMPREHENSIVE METABOLIC PANEL WITH GFR
ALT: 8 IU/L (ref 0–44)
AST: 13 IU/L (ref 0–40)
Albumin: 3.8 g/dL (ref 3.7–4.7)
Alkaline Phosphatase: 116 IU/L (ref 44–121)
BUN/Creatinine Ratio: 11 (ref 10–24)
BUN: 16 mg/dL (ref 8–27)
Bilirubin Total: 0.9 mg/dL (ref 0.0–1.2)
CO2: 24 mmol/L (ref 20–29)
Calcium: 9 mg/dL (ref 8.6–10.2)
Chloride: 102 mmol/L (ref 96–106)
Creatinine, Ser: 1.52 mg/dL — ABNORMAL HIGH (ref 0.76–1.27)
Globulin, Total: 3.2 g/dL (ref 1.5–4.5)
Glucose: 289 mg/dL — ABNORMAL HIGH (ref 70–99)
Potassium: 3.7 mmol/L (ref 3.5–5.2)
Sodium: 141 mmol/L (ref 134–144)
Total Protein: 7 g/dL (ref 6.0–8.5)
eGFR: 45 mL/min/1.73 — ABNORMAL LOW

## 2023-05-02 LAB — PRO B NATRIURETIC PEPTIDE: NT-Pro BNP: 2112 pg/mL — ABNORMAL HIGH (ref 0–486)

## 2023-05-02 NOTE — Telephone Encounter (Signed)
Left message for Lyla Son, the home health nurse to call the office back.

## 2023-05-02 NOTE — Telephone Encounter (Signed)
Called to say the patient feels exteremly week, oxygen is in the 80s, and has sob. Home aid nurse is with patient right now but want to inform the nurse/dr. Please advise

## 2023-05-02 NOTE — Telephone Encounter (Signed)
Called Lyla Son the home health nurse and she reported that the patient had not been taking his metoprolol since his cardiac cath in December. He was also feeling tired and weak. She also reported that his oxygen saturation was in the 80's with any exertion and it would recover to the low 90's while at rest. He also had +1 pitting edema up to his calves bilaterally. His most recent procedure was a thoracentesis back in December. Spoke with Wallis Bamberg based on the patient's symptoms and she recommended that the patient go to the ER. I called Lyla Son the home health nurse and she told the patient that Wallis Bamberg NP was recommending that the patient go to the ER. Patient stated that he would go to the ER.

## 2023-05-03 DIAGNOSIS — I428 Other cardiomyopathies: Secondary | ICD-10-CM | POA: Diagnosis not present

## 2023-05-03 DIAGNOSIS — I5023 Acute on chronic systolic (congestive) heart failure: Secondary | ICD-10-CM | POA: Diagnosis not present

## 2023-05-03 DIAGNOSIS — J90 Pleural effusion, not elsewhere classified: Secondary | ICD-10-CM | POA: Diagnosis not present

## 2023-05-03 DIAGNOSIS — I272 Pulmonary hypertension, unspecified: Secondary | ICD-10-CM | POA: Diagnosis not present

## 2023-05-04 DIAGNOSIS — I272 Pulmonary hypertension, unspecified: Secondary | ICD-10-CM | POA: Diagnosis not present

## 2023-05-04 DIAGNOSIS — J90 Pleural effusion, not elsewhere classified: Secondary | ICD-10-CM | POA: Diagnosis not present

## 2023-05-04 DIAGNOSIS — I5023 Acute on chronic systolic (congestive) heart failure: Secondary | ICD-10-CM | POA: Diagnosis not present

## 2023-05-04 DIAGNOSIS — I428 Other cardiomyopathies: Secondary | ICD-10-CM | POA: Diagnosis not present

## 2023-05-05 ENCOUNTER — Telehealth: Payer: Self-pay | Admitting: Emergency Medicine

## 2023-05-05 DIAGNOSIS — J90 Pleural effusion, not elsewhere classified: Secondary | ICD-10-CM | POA: Diagnosis not present

## 2023-05-05 DIAGNOSIS — I272 Pulmonary hypertension, unspecified: Secondary | ICD-10-CM | POA: Diagnosis not present

## 2023-05-05 DIAGNOSIS — I5023 Acute on chronic systolic (congestive) heart failure: Secondary | ICD-10-CM | POA: Diagnosis not present

## 2023-05-05 DIAGNOSIS — I428 Other cardiomyopathies: Secondary | ICD-10-CM | POA: Diagnosis not present

## 2023-05-05 NOTE — Telephone Encounter (Signed)
-----   Message from Flossie Dibble sent at 05/02/2023  9:15 AM EST ----- Labs are stable.  Need to start Entresto 24-26 mg twice daily, if this is cost prohibitive, tell him to let us know and we can suggest an alternative as I know some of his other meds were expensive.   Repeat BMET in 2 weeks.

## 2023-05-05 NOTE — Telephone Encounter (Signed)
Called daughter Darla per DPR. She stated that patient has been in the hospital and is being discharged today. She reported that they are adjusting his mediations, but unsure which ones.  Patient has a follow up in two weeks with Dr. Bing Matter.

## 2023-05-22 ENCOUNTER — Encounter: Payer: Self-pay | Admitting: Cardiology

## 2023-05-22 ENCOUNTER — Ambulatory Visit: Payer: HMO | Attending: Cardiology | Admitting: Cardiology

## 2023-05-22 VITALS — BP 110/64 | HR 86 | Ht 74.0 in | Wt 188.6 lb

## 2023-05-22 DIAGNOSIS — I10 Essential (primary) hypertension: Secondary | ICD-10-CM | POA: Diagnosis not present

## 2023-05-22 DIAGNOSIS — I5022 Chronic systolic (congestive) heart failure: Secondary | ICD-10-CM | POA: Diagnosis not present

## 2023-05-22 DIAGNOSIS — I5023 Acute on chronic systolic (congestive) heart failure: Secondary | ICD-10-CM | POA: Insufficient documentation

## 2023-05-22 DIAGNOSIS — E1142 Type 2 diabetes mellitus with diabetic polyneuropathy: Secondary | ICD-10-CM

## 2023-05-22 DIAGNOSIS — I340 Nonrheumatic mitral (valve) insufficiency: Secondary | ICD-10-CM

## 2023-05-22 DIAGNOSIS — Z794 Long term (current) use of insulin: Secondary | ICD-10-CM

## 2023-05-22 NOTE — Patient Instructions (Signed)
Medication Instructions:  Your physician recommends that you continue on your current medications as directed. Please refer to the Current Medication list given to you today.  *If you need a refill on your cardiac medications before your next appointment, please call your pharmacy*   Lab Work: BMP today If you have labs (blood work) drawn today and your tests are completely normal, you will receive your results only by: MyChart Message (if you have MyChart) OR A paper copy in the mail If you have any lab test that is abnormal or we need to change your treatment, we will call you to review the results.   Testing/Procedures: None Ordered   Follow-Up: At [facility], you and your health needs are our priority.  As part of our continuing mission to provide you with exceptional heart care, we have created designated Provider Care Teams.  These Care Teams include your primary Cardiologist (physician) and Advanced Practice Providers (APPs -  Physician Assistants and Nurse Practitioners) who all work together to provide you with the care you need, when you need it.  We recommend signing up for the patient portal called "MyChart".  Sign up information is provided on this After Visit Summary.  MyChart is used to connect with patients for Virtual Visits (Telemedicine).  Patients are able to view lab/test results, encounter notes, upcoming appointments, etc.  Non-urgent messages can be sent to your provider as well.   To learn more about what you can do with MyChart, go to [email or web address].    Your next appointment:   3 month(s)  The format for your next appointment:   In Person  Provider:   Robert Krasowski, MD    Other Instructions NA  

## 2023-05-22 NOTE — Addendum Note (Signed)
 Addended by: Shawnee Dellen D on: 05/22/2023 09:07 AM   Modules accepted: Orders

## 2023-05-22 NOTE — Progress Notes (Signed)
 Cardiology Office Note:    Date:  05/22/2023   ID:  Luke Edwards, DOB 1941/03/17, MRN [id number]  PCP:  Deedra Farr, MD  Cardiologist:  Ralene Burger, MD    Referring MD: Deedra Farr, MD   Chief Complaint  Patient presents with   Follow-up    History of Present Illness:    Luke Edwards is a 83 y.o. male with essential hypertension, congestive heart failure with reduction.  But his weight ejection fraction 35 to 40%, chronic kidney failure, diabetes, moderate MR, history of CVA, BPH. Comes today to months for follow-up in [facility] because of decompensated congestive heart failure, on March 28, 2023 he had cardiac catheterization performed which showed nonobstructive coronary disease he did have elevation of the pulm artery pressure.  Gradually with 20 mg as directed medical therapy due to obstacles.  Arthritis is his left knee being slightly soft the second is kidney dysfunction. Comes today to months for follow-up overall doing great.  Asymptomatic try to walk on the regular basis doing well.  No chest pain tightness squeezing pressure burning chest.  He have to wake up many times during the night to urinate.  Past Medical History:  Diagnosis Date   Acquired hypothyroidism 03/23/2023   Acute respiratory distress syndrome (ARDS) due to COVID-19 virus (HCC) 04/29/2019   Acute respiratory failure with hypoxia (HCC) 05/01/2019   Anemia 06/22/2019   BPH (benign prostatic hyperplasia) 12/20/2022   Chest pain 03/05/2017   CHF (congestive heart failure) (HCC), both reduced LVEF 35 to 40% by echocardiogram December 2024 at [facility] 03/24/2023   Chorea 06/22/2019   CKD (chronic kidney disease), stage III (HCC) 03/24/2023   Closed fracture of neck of left femur (HCC) 12/20/2022   COVID-19 virus infection 04/29/2019   Diabetes mellitus without complication (HCC)    DM2 (diabetes mellitus, type 2) (HCC) 06/22/2019   Essential hypertension  05/01/2019   Fall 12/20/2022   Gastroesophageal reflux disease without esophagitis 02/18/2020   GERD (gastroesophageal reflux disease)    HLD (hyperlipidemia)    Hypertension    Hyponatremia 06/22/2019   Moderate mitral regurgitation, eccentric appearing on transthoracic echo 03/20/2023 at [facility] 03/24/2023   Osteoarthritis of left knee 12/08/2020   Osteoarthritis of right knee 01/31/2023   S/p left hip fracture 12/22/2022   Type 2 diabetes mellitus with diabetic polyneuropathy, with long-term current use of insulin  (HCC) 02/18/2020    Past Surgical History:  Procedure Laterality Date   APPENDECTOMY     CHOLECYSTECTOMY     RIGHT/LEFT HEART CATH AND CORONARY ANGIOGRAPHY N/A 03/28/2023   Procedure: RIGHT/LEFT HEART CATH AND CORONARY ANGIOGRAPHY;  Surgeon: [place]az[place], Peter M, MD;  Location: [facility];  Service: Cardiovascular;  Laterality: N/A;   TOTAL HIP ARTHROPLASTY Left 12/23/2022   Procedure: TOTAL HIP ARTHROPLASTY ANTERIOR APPROACH;  Surgeon: Adonica Hoose, MD;  Location: [facility] OR;  Service: Orthopedics;  Laterality: Left;    Current Medications: Current Meds  Medication Sig   aspirin  81 MG chewable tablet Chew 1 tablet (81 mg total) by mouth 2 (two) times daily. Take twice daily for 4 weeks and then once daily as before   atorvastatin (LIPITOR) 20 MG tablet Take 20 mg by mouth daily.   Cyanocobalamin  (VITAMIN B-12) 2500 MCG SUBL Place 2,500 mcg under the tongue daily.   dapagliflozin  propanediol (FARXIGA ) 10 MG TABS tablet Take 1 tablet (10 mg total) by mouth daily.   dapagliflozin  propanediol (FARXIGA ) 10 MG TABS tablet Take  1 tablet (10 mg total) by mouth daily before breakfast.   empagliflozin  (JARDIANCE ) 10 MG TABS tablet Take 1 tablet (10 mg total) by mouth daily before breakfast.   famotidine  (PEPCID ) 20 MG tablet Take 20 mg by mouth daily.   feeding supplement, GLUCERNA SHAKE, (GLUCERNA SHAKE) LIQD Take 237 mLs by mouth 3 (three) times daily between  meals.   fluticasone (FLONASE) 50 MCG/ACT nasal spray Place 1 spray into both nostrils daily.   furosemide (LASIX) 20 MG tablet Take 20 mg by mouth daily as needed for edema.   gabapentin  (NEURONTIN ) 600 MG tablet Take 600 mg by mouth 2 (two) times daily.   insulin  glargine (LANTUS ) 100 UNIT/ML injection Inject 0.2 mLs (20 Units total) into the skin 2 (two) times daily for 4 days, THEN 0.18 mLs (18 Units total) 2 (two) times daily for 3 days, THEN 0.15 mLs (15 Units total) 2 (two) times daily for 3 days, THEN 0.12 mLs (12 Units total) 2 (two) times daily for 3 days, THEN 0.08 mLs (8 Units total) 2 (two) times daily for 3 days, THEN 0.05 mLs (5 Units total) 2 (two) times daily for 3 days.   insulin  regular (NOVOLIN R) 100 units/mL injection Inject 6 Units into the skin 3 (three) times daily before meals. Inject 2-10 units per sliding scale three times daily before meals: 150-200=2u, 201-250=4u, 251-300=6u, 301-350=8u, 351-400=10   loratadine (CLARITIN) 10 MG tablet Take 10 mg by mouth daily.   metoprolol  succinate (TOPROL -XL) 25 MG 24 hr tablet Take 1 tablet (25 mg total) by mouth daily.   metoprolol  succinate (TOPROL -XL) 50 MG 24 hr tablet Take 1 tablet (50 mg total) by mouth daily. Take with or immediately following a meal.   Multiple Vitamin (MULTIVITAMIN WITH MINERALS) TABS tablet Take 1 tablet by mouth daily.     Allergies:   Levofloxacin and Prednisone    Social History   Socioeconomic History   Marital status: Married    Spouse name: Louanna Rouse   Number of children: 2   Years of education: 11   Highest education level: Not on file  Occupational History   Not on file  Tobacco Use   Smoking status: Never   Smokeless tobacco: Never  Vaping Use   Vaping status: Never Used  Substance and Sexual Activity   Alcohol use: Yes    Alcohol/week: 2.0 standard drinks of alcohol    Types: 2 Cans of beer per week    Comment: Once during the week    Drug use: No   Sexual activity: Not on file   Other Topics Concern   Not on file  Social History Narrative   Right handed   Lives in one story and lives with wife and daughter   Social Drivers of Health   Financial Resource Strain: Not on file  Food Insecurity: Low Risk  (02/03/2023)   Received from [facility]   Hunger Vital Sign    Worried About Running Out of Food in the Last Year: Never true    Ran Out of Food in the Last Year: Never true  Transportation Needs: No Transportation Needs (02/03/2023)   Received from [facility]    In the past 12 months, has lack of reliable transportation kept you from medical appointments, meetings, work or from getting things needed for daily living? : No  Physical Activity: Not on file  Stress: Not on file  Social Connections: Not on file     Family History: The patient's  family history includes Diabetes in his daughter and mother; Heart attack in his father; Valvular heart disease in his mother. ROS:   Please see the history of present illness.    All 14 point review of systems negative except as described per history of present illness  EKGs/Labs/Other Studies Reviewed:         Recent Labs: 12/24/2022: Magnesium  1.8 03/24/2023: Platelets 382 03/28/2023: Hemoglobin 11.6; Hemoglobin 11.2 05/01/2023: ALT 8; BUN 16; Creatinine, Ser 1.52; NT-Pro BNP 2,112; Potassium 3.7; Sodium 141  Recent Lipid Panel    Component Value Date/Time   CHOL 124 03/24/2023 1437   TRIG 107 03/24/2023 1437   HDL 32 (L) 03/24/2023 1437   CHOLHDL 3.9 03/24/2023 1437   LDLCALC 72 03/24/2023 1437    Physical Exam:    VS:  BP 110/64 (BP Location: Right Arm, Patient Position: Sitting)   Pulse 86   Ht 6\' 2"  (1.88 m)   Wt 188 lb 9.6 oz (85.5 kg)   SpO2 93%   BMI 24.21 kg/m     Wt Readings from Last 3 Encounters:  05/22/23 188 lb 9.6 oz (85.5 kg)  05/01/23 202 lb 3.2 oz (91.7 kg)  03/28/23 205 lb (93 kg)     GEN:  Well nourished, well developed in no acute distress HEENT:  Normal NECK: No JVD; No carotid bruits LYMPHATICS: No lymphadenopathy CARDIAC: RRR, holosystolic murmur grade 2/6 basilar border of the sternum, no rubs, no gallops RESPIRATORY:  Clear to auscultation without rales, wheezing or rhonchi  ABDOMEN: Soft, non-tender, non-distended MUSCULOSKELETAL:  No edema; No deformity  SKIN: Warm and dry LOWER EXTREMITIES: no swelling NEUROLOGIC:  Alert and oriented x 3 PSYCHIATRIC:  Normal affect   ASSESSMENT:    1. Chronic systolic congestive heart failure (HCC)   2. Moderate mitral regurgitation, eccentric appearing on transthoracic echo 03/20/2023 at [facility]   3. Primary hypertension   4. Type 2 diabetes mellitus with diabetic polyneuropathy, with long-term current use of insulin  (HCC)    PLAN:    In order of problems listed above:  Chronic systolic congestive heart failure compensated on the physical exam.  I will reduce dose of Lasix from 60 to 40 mg daily ask him to continue checking his weight like he already does.  I will check his Chem-7 today if Chem-7 is reasonable we will start him on losartan with target medication of Entresto.  He is taking Toprol -XL as well as Jardiance . Essential hypertension blood pressure well-controlled continue present management. Kidney dysfunction again Chem-7 will be checked. Dyslipidemia I did review K PN which show me data from 03/24/2023 with LDL 72 HDL 32 continue present management. I encouraged him to be more active with exercise and doing the best which hopefully continue.  He is thinking about joining Silver sneakers regularly   Medication Adjustments/Labs and Tests Ordered: Current medicines are reviewed at length with the patient today.  Concerns regarding medicines are outlined above.  No orders of the defined types were placed in this encounter.  Medication changes: No orders of the defined types were placed in this encounter.   Signed, Manfred Seed, MD, [facility] 05/22/2023  9:00 AM    [facility]

## 2023-05-23 LAB — BASIC METABOLIC PANEL
BUN/Creatinine Ratio: 16 (ref 10–24)
BUN: 24 mg/dL (ref 8–27)
CO2: 23 mmol/L (ref 20–29)
Calcium: 9.4 mg/dL (ref 8.6–10.2)
Chloride: 99 mmol/L (ref 96–106)
Creatinine, Ser: 1.5 mg/dL — ABNORMAL HIGH (ref 0.76–1.27)
Glucose: 287 mg/dL — ABNORMAL HIGH (ref 70–99)
Potassium: 3.9 mmol/L (ref 3.5–5.2)
Sodium: 140 mmol/L (ref 134–144)
eGFR: 46 mL/min/{1.73_m2} — ABNORMAL LOW (ref >59)

## 2023-05-24 ENCOUNTER — Telehealth: Payer: Self-pay

## 2023-05-24 NOTE — Telephone Encounter (Signed)
Left message on My Chart with normal lab results per Dr. Vanetta Shawl note. Routed to PCP.

## 2023-06-02 ENCOUNTER — Telehealth: Payer: Self-pay

## 2023-06-02 ENCOUNTER — Telehealth: Payer: Self-pay | Admitting: Cardiology

## 2023-06-02 NOTE — Telephone Encounter (Signed)
Pt c/o medication issue:  1. Name of Medication: dapagliflozin propanediol (FARXIGA) 10 MG TABS tablet   2. How are you currently taking this medication (dosage and times per day)?   3. Are you having a reaction (difficulty breathing--STAT)? Yes   4. What is your medication issue? Patient's daughter states this medication may be causing patient to have an upset stomach and would like a call back to discuss further. Please advise.

## 2023-06-02 NOTE — Telephone Encounter (Signed)
Pt c/o nausea and food smells making him nauseous. He wondered if the Marcelline Deist would be causing it. He will try being off the medication a few days as a trial to see if his symptoms would resolve. He will call back to let us know.

## 2023-08-22 ENCOUNTER — Encounter: Payer: Self-pay | Admitting: Cardiology

## 2023-08-23 ENCOUNTER — Encounter: Payer: Self-pay | Admitting: Cardiology

## 2023-08-23 ENCOUNTER — Ambulatory Visit: Payer: HMO | Attending: Cardiology | Admitting: Cardiology

## 2023-08-23 VITALS — BP 132/76 | HR 58 | Ht 74.0 in | Wt 184.6 lb

## 2023-08-23 DIAGNOSIS — I10 Essential (primary) hypertension: Secondary | ICD-10-CM | POA: Diagnosis not present

## 2023-08-23 DIAGNOSIS — I5022 Chronic systolic (congestive) heart failure: Secondary | ICD-10-CM

## 2023-08-23 DIAGNOSIS — I340 Nonrheumatic mitral (valve) insufficiency: Secondary | ICD-10-CM | POA: Diagnosis not present

## 2023-08-23 DIAGNOSIS — E119 Type 2 diabetes mellitus without complications: Secondary | ICD-10-CM | POA: Diagnosis not present

## 2023-08-23 DIAGNOSIS — Z794 Long term (current) use of insulin: Secondary | ICD-10-CM

## 2023-08-23 NOTE — Patient Instructions (Signed)
 Medication Instructions:  Your physician recommends that you continue on your current medications as directed. Please refer to the Current Medication list given to you today.  *If you need a refill on your cardiac medications before your next appointment, please call your pharmacy*   Lab Work: Your physician recommends that you have a BMP today in the office.  If you have labs (blood work) drawn today and your tests are completely normal, you will receive your results only by: MyChart Message (if you have MyChart) OR A paper copy in the mail If you have any lab test that is abnormal or we need to change your treatment, we will call you to review the results.  Testing/Procedures: Your physician has requested that you have an echocardiogram. Echocardiography is a painless test that uses sound waves to create images of your heart. It provides your doctor with information about the size and shape of your heart and how well your heart's chambers and valves are working. This procedure takes approximately one hour. There are no restrictions for this procedure. Please do NOT wear cologne, perfume, aftershave, or lotions (deodorant is allowed). Please arrive 15 minutes prior to your appointment time.  Please note: We ask at that you not bring children with you during ultrasound (echo/ vascular) testing. Due to room size and safety concerns, children are not allowed in the ultrasound rooms during exams. Our front office staff cannot provide observation of children in our lobby area while testing is being conducted. An adult accompanying a patient to their appointment will only be allowed in the ultrasound room at the discretion of the ultrasound technician under special circumstances. We apologize for any inconvenience.  Follow-Up: At [facility], you and your health needs are our priority.  As part of our continuing mission to provide you with exceptional heart care, we have created designated  Provider Care Teams.  These Care Teams include your primary Cardiologist (physician) and Advanced Practice Providers (APPs -  Physician Assistants and Nurse Practitioners) who all work together to provide you with the care you need, when you need it.  We recommend signing up for the patient portal called "MyChart".  Sign up information is provided on this After Visit Summary.  MyChart is used to connect with patients for Virtual Visits (Telemedicine).  Patients are able to view lab/test results, encounter notes, upcoming appointments, etc.  Non-urgent messages can be sent to your provider as well.   To learn more about what you can do with MyChart, go to [email or web address].    Your next appointment:   5 month(s)  The format for your next appointment:   In Person  Provider:   Ralene Burger, MD   Other Instructions Echocardiogram An echocardiogram is a test that uses sound waves (ultrasound) to produce images of the heart. Images from an echocardiogram can provide important information about: Heart size and shape. The size and thickness and movement of your heart's walls. Heart muscle function and strength. Heart valve function or if you have stenosis. Stenosis is when the heart valves are too narrow. If blood is flowing backward through the heart valves (regurgitation). A tumor or infectious growth around the heart valves. Areas of heart muscle that are not working well because of poor blood flow or injury from a heart attack. Aneurysm detection. An aneurysm is a weak or damaged part of an artery wall. The wall bulges out from the normal force of blood pumping through the body. Tell a health care provider about: Any  allergies you have. All medicines you are taking, including vitamins, herbs, eye drops, creams, and over-the-counter medicines. Any blood disorders you have. Any surgeries you have had. Any medical conditions you have. Whether you are pregnant or may be  pregnant. What are the risks? Generally, this is a safe test. However, problems may occur, including an allergic reaction to dye (contrast) that may be used during the test. What happens before the test? No specific preparation is needed. You may eat and drink normally. What happens during the test? You will take off your clothes from the waist up and put on a hospital gown. Electrodes or electrocardiogram (ECG)patches may be placed on your chest. The electrodes or patches are then connected to a device that monitors your heart rate and rhythm. You will lie down on a table for an ultrasound exam. A gel will be applied to your chest to help sound waves pass through your skin. A handheld device, called a transducer, will be pressed against your chest and moved over your heart. The transducer produces sound waves that travel to your heart and bounce back (or "echo" back) to the transducer. These sound waves will be captured in real-time and changed into images of your heart that can be viewed on a video monitor. The images will be recorded on a computer and reviewed by your health care provider. You may be asked to change positions or hold your breath for a short time. This makes it easier to get different views or better views of your heart. In some cases, you may receive contrast through an IV in one of your veins. This can improve the quality of the pictures from your heart. The procedure may vary among health care providers and hospitals.   What can I expect after the test? You may return to your normal, everyday life, including diet, activities, and medicines, unless your health care provider tells you not to do that. Follow these instructions at home: It is up to you to get the results of your test. Ask your health care provider, or the department that is doing the test, when your results will be ready. Keep all follow-up visits. This is important. Summary An echocardiogram is a test that uses  sound waves (ultrasound) to produce images of the heart. Images from an echocardiogram can provide important information about the size and shape of your heart, heart muscle function, heart valve function, and other possible heart problems. You do not need to do anything to prepare before this test. You may eat and drink normally. After the echocardiogram is completed, you may return to your normal, everyday life, unless your health care provider tells you not to do that. This information is not intended to replace advice given to you by your health care provider. Make sure you discuss any questions you have with your health care provider. Document Revised: 11/19/2019 Document Reviewed: 11/19/2019 Elsevier Patient Education  2021 [employer] Inc[employer]   Important Information About Sugar

## 2023-08-23 NOTE — Addendum Note (Signed)
 Addended by: Einar Grave on: 08/23/2023 08:19 AM   Modules accepted: Orders

## 2023-08-23 NOTE — Progress Notes (Signed)
 Cardiology Office Note:    Date:  08/23/2023   ID:  Luke Edwards, DOB [birth year], MRN [id number]  PCP:  Luke Kindle, MD  Cardiologist:  Luke Burger, MD    Referring MD: Luke Kindle, MD   Chief Complaint  Patient presents with   Follow-up    History of Present Illness:    Luke Edwards is a 83 y.o. male past medical history significant for cardiomyopathy ejection fraction 35 to 40% nonischemic, cardiac catheterization showed only luminal disease.  Essential hypertension, dyslipidemia, diabetes.  Comes today 2 months for follow-up of doing fine denies of any chest pain tightness squeezing pressure burning chest.  The biggest problem he got is the fact when he takes Lasix he wet himself.  He only takes 20 mL of Lasix there is no swelling and no shortness of breath I think we will be able to stop the medication.  No chest pain no other cardiac complaints still trying to be active and doing quite well  Past Medical History:  Diagnosis Date   Acquired hypothyroidism 03/23/2023   Acute respiratory distress syndrome (ARDS) due to COVID-19 virus (HCC) 04/29/2019   Acute respiratory failure with hypoxia (HCC) 05/01/2019   Anemia 06/22/2019   BPH (benign prostatic hyperplasia) 12/20/2022   Chest pain 03/05/2017   CHF (congestive heart failure) (HCC), both reduced LVEF 35 to 40% by echocardiogram December 2024 at [facility] 03/24/2023   Chorea 06/22/2019   CKD (chronic kidney disease), stage III (HCC) 03/24/2023   Closed fracture of neck of left femur (HCC) 12/20/2022   COVID-19 virus infection 04/29/2019   Diabetes mellitus without complication (HCC)    DM2 (diabetes mellitus, type 2) (HCC) 06/22/2019   Essential hypertension 05/01/2019   Fall 12/20/2022   Gastroesophageal reflux disease without esophagitis 02/18/2020   GERD (gastroesophageal reflux disease)    HLD (hyperlipidemia)    Hypertension    Hyponatremia 06/22/2019   Moderate mitral regurgitation,  eccentric appearing on transthoracic echo 03/20/2023 at [facility] 03/24/2023   Osteoarthritis of left knee 12/08/2020   Osteoarthritis of right knee 01/31/2023   S/p left hip fracture 12/22/2022   Type 2 diabetes mellitus with diabetic polyneuropathy, with long-term current use of insulin  (HCC) 02/18/2020    Past Surgical History:  Procedure Laterality Date   APPENDECTOMY     CHOLECYSTECTOMY     RIGHT/LEFT HEART CATH AND CORONARY ANGIOGRAPHY N/A 03/28/2023   Procedure: RIGHT/LEFT HEART CATH AND CORONARY ANGIOGRAPHY;  Surgeon: [place], Peter M, MD;  Location: [facility] INVASIVE CV LAB;  Service: Cardiovascular;  Laterality: N/A;   TOTAL HIP ARTHROPLASTY Left 12/23/2022   Procedure: TOTAL HIP ARTHROPLASTY ANTERIOR APPROACH;  Surgeon: Luke Hoose, MD;  Location: [facility] OR;  Service: Orthopedics;  Laterality: Left;    Current Medications: Current Meds  Medication Sig   aspirin  81 MG chewable tablet Chew 1 tablet (81 mg total) by mouth 2 (two) times daily. Take twice daily for 4 weeks and then once daily as before   atorvastatin (LIPITOR) 20 MG tablet Take 20 mg by mouth daily.   Cyanocobalamin  (VITAMIN B-12) 2500 MCG SUBL Place 2,500 mcg under the tongue daily.   dapagliflozin  propanediol (FARXIGA ) 10 MG TABS tablet Take 1 tablet (10 mg total) by mouth daily.   dapagliflozin  propanediol (FARXIGA ) 10 MG TABS tablet Take 1 tablet (10 mg total) by mouth daily before breakfast.   empagliflozin  (JARDIANCE ) 10 MG TABS tablet Take 1 tablet (10 mg total) by mouth daily before breakfast.  famotidine  (PEPCID ) 20 MG tablet Take 20 mg by mouth daily.   feeding supplement, GLUCERNA SHAKE, (GLUCERNA SHAKE) LIQD Take 237 mLs by mouth 3 (three) times daily between meals.   fluticasone (FLONASE) 50 MCG/ACT nasal spray Place 1 spray into both nostrils daily.   furosemide (LASIX) 20 MG tablet Take 20 mg by mouth daily as needed for edema.   gabapentin  (NEURONTIN ) 600 MG tablet Take 600 mg by mouth 2  (two) times daily.   insulin  glargine (LANTUS ) 100 UNIT/ML injection Inject 0.2 mLs (20 Units total) into the skin 2 (two) times daily for 4 days, THEN 0.18 mLs (18 Units total) 2 (two) times daily for 3 days, THEN 0.15 mLs (15 Units total) 2 (two) times daily for 3 days, THEN 0.12 mLs (12 Units total) 2 (two) times daily for 3 days, THEN 0.08 mLs (8 Units total) 2 (two) times daily for 3 days, THEN 0.05 mLs (5 Units total) 2 (two) times daily for 3 days.   insulin  regular (NOVOLIN R) 100 units/mL injection Inject 6 Units into the skin 3 (three) times daily before meals. Inject 2-10 units per sliding scale three times daily before meals: 150-200=2u, 201-250=4u, 251-300=6u, 301-350=8u, 351-400=10   loratadine (CLARITIN) 10 MG tablet Take 10 mg by mouth daily.   metoprolol  succinate (TOPROL -XL) 25 MG 24 hr tablet Take 1 tablet (25 mg total) by mouth daily.   metoprolol  succinate (TOPROL -XL) 50 MG 24 hr tablet Take 1 tablet (50 mg total) by mouth daily. Take with or immediately following a meal.   Multiple Vitamin (MULTIVITAMIN WITH MINERALS) TABS tablet Take 1 tablet by mouth daily.     Allergies:   Levofloxacin and Prednisone    Social History   Socioeconomic History   Marital status: Married    Spouse name: Luke Edwards   Number of children: 2   Years of education: 11   Highest education level: Not on file  Occupational History   Not on file  Tobacco Use   Smoking status: Never   Smokeless tobacco: Never  Vaping Use   Vaping status: Never Used  Substance and Sexual Activity   Alcohol use: Yes    Alcohol/week: 2.0 standard drinks of alcohol    Types: 2 Cans of beer per week    Comment: Once during the week    Drug use: No   Sexual activity: Not on file  Other Topics Concern   Not on file  Social History Narrative   Right handed   Lives in one story and lives with wife and daughter   Social Drivers of Health   Financial Resource Strain: Not on file  Food Insecurity: Low Risk   (02/03/2023)   Received from [facility]   Hunger Vital Sign    Worried About Running Out of Food in the Last Year: Never true    Ran Out of Food in the Last Year: Never true  Transportation Needs: No Transportation Needs (02/03/2023)   Received from [employer]    In the past 12 months, has lack of reliable transportation kept you from medical appointments, meetings, work or from getting things needed for daily living? : No  Physical Activity: Not on file  Stress: Not on file  Social Connections: Not on file     Family History: The patient's family history includes Diabetes in his daughter and mother; Heart attack in his father; Valvular heart disease in his mother. ROS:   Please see the history of present illness.  All 14 point review of systems negative except as described per history of present illness  EKGs/Labs/Other Studies Reviewed:         Recent Labs: 12/24/2022: Magnesium  1.8 03/24/2023: Platelets 382 03/28/2023: Hemoglobin 11.6; Hemoglobin 11.2 05/01/2023: ALT 8; NT-Pro BNP 2,112 05/22/2023: BUN 24; Creatinine, Ser 1.50; Potassium 3.9; Sodium 140  Recent Lipid Panel    Component Value Date/Time   CHOL 124 03/24/2023 1437   TRIG 107 03/24/2023 1437   HDL 32 (L) 03/24/2023 1437   CHOLHDL 3.9 03/24/2023 1437   LDLCALC 72 03/24/2023 1437    Physical Exam:    VS:  BP 132/76 (BP Location: Right Arm, Patient Position: Sitting)   Pulse (!) 58   Ht 6\' 2"  (1.88 Edwards)   Wt 184 lb 9.6 oz (83.7 kg)   SpO2 92%   BMI 23.70 kg/Edwards     Wt Readings from Last 3 Encounters:  08/23/23 184 lb 9.6 oz (83.7 kg)  05/22/23 188 lb 9.6 oz (85.5 kg)  05/01/23 202 lb 3.2 oz (91.7 kg)     GEN:  Well nourished, well developed in no acute distress HEENT: Normal NECK: No JVD; No carotid bruits LYMPHATICS: No lymphadenopathy CARDIAC: RRR, no murmurs, no rubs, no gallops RESPIRATORY:  Clear to auscultation without rales, wheezing or rhonchi  ABDOMEN: Soft,  non-tender, non-distended MUSCULOSKELETAL:  No edema; No deformity  SKIN: Warm and dry LOWER EXTREMITIES: no swelling NEUROLOGIC:  Alert and oriented x 3 PSYCHIATRIC:  Normal affect   ASSESSMENT:    1. Chronic systolic congestive heart failure (HCC)   2. Essential hypertension   3. Moderate mitral regurgitation, eccentric appearing on transthoracic echo 03/20/2023 at [facility]   4. Type 2 diabetes mellitus without complication, with long-term current use of insulin  (HCC)    PLAN:    In order of problems listed above:  Congestive heart failure seems to be compensated on physical exam gradually trying to put on guideline directed medical therapy, he had difficulty tolerating Lasix because of the fact that he wet himself.  But he takes only 20 mg ask him to stop Lasix and check his weight on the regular basis if weights are going up then he is to go back on Lasix.  In about a month I will recheck his Chem-7 if Chem-7 is fine we will try to augment his medical therapy also in about a month I will repeat his echocardiogram to recheck left ventricle ejection fraction. Essential hypertension seems to be well-controlled. Mitral regurgitation eccentric moderate based on echocardiogram from hospital.  Will repeat echocardiogram again. Type 2 diabetes followed by antimedicine team hemoglobin A1c 9.7 clearly not well-controlled we need to do a better job he understand that   Medication Adjustments/Labs and Tests Ordered: Current medicines are reviewed at length with the patient today.  Concerns regarding medicines are outlined above.  No orders of the defined types were placed in this encounter.  Medication changes: No orders of the defined types were placed in this encounter.   Signed, Manfred Seed, MD, [facility] 08/23/2023 8:11 AM    [facility]

## 2023-08-24 LAB — BASIC METABOLIC PANEL WITH GFR
BUN/Creatinine Ratio: 13 (ref 10–24)
BUN: 19 mg/dL (ref 8–27)
CO2: 22 mmol/L (ref 20–29)
Calcium: 8.9 mg/dL (ref 8.6–10.2)
Chloride: 100 mmol/L (ref 96–106)
Creatinine, Ser: 1.5 mg/dL — ABNORMAL HIGH (ref 0.76–1.27)
Glucose: 293 mg/dL — ABNORMAL HIGH (ref 70–99)
Potassium: 3.8 mmol/L (ref 3.5–5.2)
Sodium: 138 mmol/L (ref 134–144)
eGFR: 46 mL/min/{1.73_m2} — ABNORMAL LOW (ref >59)

## 2023-09-11 ENCOUNTER — Encounter: Payer: Self-pay | Admitting: Cardiology

## 2023-09-13 ENCOUNTER — Encounter: Payer: Self-pay | Admitting: Cardiology

## 2023-09-22 ENCOUNTER — Ambulatory Visit: Attending: Cardiology

## 2023-09-22 DIAGNOSIS — I340 Nonrheumatic mitral (valve) insufficiency: Secondary | ICD-10-CM | POA: Diagnosis not present

## 2023-09-22 DIAGNOSIS — I5022 Chronic systolic (congestive) heart failure: Secondary | ICD-10-CM

## 2023-09-22 LAB — ECHOCARDIOGRAM COMPLETE
AR max vel: 1.3 cm2
AV Area VTI: 1.38 cm2
AV Area mean vel: 1.28 cm2
AV Mean grad: 9.2 mmHg
AV Peak grad: 16.5 mmHg
AV Vena cont: 0.3 cm
Ao pk vel: 2.03 m/s
Area-P 1/2: 3.21 cm2
MV M vel: 5.67 m/s
MV Peak grad: 128.6 mmHg
Radius: 0.6 cm
S' Lateral: 4.6 cm

## 2023-09-26 ENCOUNTER — Ambulatory Visit: Payer: Self-pay | Admitting: Cardiology

## 2024-06-11 ENCOUNTER — Ambulatory Visit: Admitting: Cardiology
